# Patient Record
Sex: Female | Born: 1975 | Race: Black or African American | Hispanic: No | Marital: Single | Smoking: Current some day smoker
Health system: Southern US, Community
[De-identification: ages and names within clinical notes are randomized; demographics above are authoritative.]

## PROBLEM LIST (undated history)

## (undated) ENCOUNTER — Inpatient Hospital Stay (HOSPITAL_COMMUNITY): Payer: Self-pay

## (undated) DIAGNOSIS — O09523 Supervision of elderly multigravida, third trimester: Secondary | ICD-10-CM

## (undated) DIAGNOSIS — N736 Female pelvic peritoneal adhesions (postinfective): Secondary | ICD-10-CM

## (undated) DIAGNOSIS — O4590 Premature separation of placenta, unspecified, unspecified trimester: Secondary | ICD-10-CM

## (undated) DIAGNOSIS — D649 Anemia, unspecified: Secondary | ICD-10-CM

## (undated) DIAGNOSIS — O0943 Supervision of pregnancy with grand multiparity, third trimester: Secondary | ICD-10-CM

## (undated) DIAGNOSIS — O139 Gestational [pregnancy-induced] hypertension without significant proteinuria, unspecified trimester: Secondary | ICD-10-CM

## (undated) DIAGNOSIS — T7491XA Unspecified adult maltreatment, confirmed, initial encounter: Secondary | ICD-10-CM

## (undated) DIAGNOSIS — Z720 Tobacco use: Secondary | ICD-10-CM

## (undated) DIAGNOSIS — F1510 Other stimulant abuse, uncomplicated: Secondary | ICD-10-CM

## (undated) DIAGNOSIS — O09299 Supervision of pregnancy with other poor reproductive or obstetric history, unspecified trimester: Secondary | ICD-10-CM

## (undated) DIAGNOSIS — J45909 Unspecified asthma, uncomplicated: Secondary | ICD-10-CM

## (undated) DIAGNOSIS — F32A Depression, unspecified: Secondary | ICD-10-CM

## (undated) DIAGNOSIS — F329 Major depressive disorder, single episode, unspecified: Secondary | ICD-10-CM

---

## 1996-05-01 DIAGNOSIS — 419620001 Death: Secondary | SNOMED CT

## 1996-05-01 DEATH — deceased

## 2016-05-29 ENCOUNTER — Inpatient Hospital Stay (HOSPITAL_COMMUNITY): Payer: Medicaid Other

## 2016-05-29 ENCOUNTER — Encounter (HOSPITAL_COMMUNITY): Payer: Self-pay | Admitting: *Deleted

## 2016-05-29 ENCOUNTER — Inpatient Hospital Stay (HOSPITAL_COMMUNITY)
Admission: AD | Admit: 2016-05-29 | Discharge: 2016-05-29 | Disposition: A | Discharge status: Home / Self Care | Payer: Medicaid Other | Source: Ambulatory Visit | Attending: Obstetrics & Gynecology | Admitting: Obstetrics & Gynecology

## 2016-05-29 DIAGNOSIS — O98811 Other maternal infectious and parasitic diseases complicating pregnancy, first trimester: Secondary | ICD-10-CM | POA: Diagnosis not present

## 2016-05-29 DIAGNOSIS — F17210 Nicotine dependence, cigarettes, uncomplicated: Secondary | ICD-10-CM | POA: Diagnosis not present

## 2016-05-29 DIAGNOSIS — Z3A09 9 weeks gestation of pregnancy: Secondary | ICD-10-CM | POA: Insufficient documentation

## 2016-05-29 DIAGNOSIS — O99331 Smoking (tobacco) complicating pregnancy, first trimester: Secondary | ICD-10-CM | POA: Insufficient documentation

## 2016-05-29 DIAGNOSIS — B3731 Acute candidiasis of vulva and vagina: Secondary | ICD-10-CM

## 2016-05-29 DIAGNOSIS — R1030 Lower abdominal pain, unspecified: Secondary | ICD-10-CM | POA: Diagnosis present

## 2016-05-29 DIAGNOSIS — B9689 Other specified bacterial agents as the cause of diseases classified elsewhere: Secondary | ICD-10-CM | POA: Diagnosis not present

## 2016-05-29 DIAGNOSIS — N760 Acute vaginitis: Secondary | ICD-10-CM

## 2016-05-29 DIAGNOSIS — R109 Unspecified abdominal pain: Secondary | ICD-10-CM

## 2016-05-29 DIAGNOSIS — B373 Candidiasis of vulva and vagina: Secondary | ICD-10-CM

## 2016-05-29 DIAGNOSIS — Z3491 Encounter for supervision of normal pregnancy, unspecified, first trimester: Secondary | ICD-10-CM

## 2016-05-29 DIAGNOSIS — O26899 Other specified pregnancy related conditions, unspecified trimester: Secondary | ICD-10-CM

## 2016-05-29 HISTORY — DX: Gestational (pregnancy-induced) hypertension without significant proteinuria, unspecified trimester: O13.9

## 2016-05-29 HISTORY — DX: Depression, unspecified: F32.A

## 2016-05-29 HISTORY — DX: Major depressive disorder, single episode, unspecified: F32.9

## 2016-05-29 HISTORY — DX: Unspecified asthma, uncomplicated: J45.909

## 2016-05-29 LAB — POCT PREGNANCY, URINE: Preg Test, Ur: POSITIVE — AB

## 2016-05-29 LAB — WET PREP, GENITAL
SPERM: NONE SEEN
TRICH WET PREP: NONE SEEN

## 2016-05-29 LAB — RAPID URINE DRUG SCREEN, HOSP PERFORMED
AMPHETAMINES: POSITIVE — AB
Barbiturates: NOT DETECTED
Benzodiazepines: NOT DETECTED
Cocaine: NOT DETECTED
Opiates: NOT DETECTED
TETRAHYDROCANNABINOL: NOT DETECTED

## 2016-05-29 LAB — URINALYSIS, ROUTINE W REFLEX MICROSCOPIC
Bilirubin Urine: NEGATIVE
GLUCOSE, UA: NEGATIVE mg/dL
Ketones, ur: NEGATIVE mg/dL
LEUKOCYTES UA: NEGATIVE
Nitrite: NEGATIVE
PH: 6 (ref 5.0–8.0)
Protein, ur: NEGATIVE mg/dL
Specific Gravity, Urine: 1.02 (ref 1.005–1.030)

## 2016-05-29 LAB — URINE MICROSCOPIC-ADD ON

## 2016-05-29 LAB — CBC
HCT: 36.8 % (ref 36.0–46.0)
HEMOGLOBIN: 12.7 g/dL (ref 12.0–15.0)
MCH: 29.7 pg (ref 26.0–34.0)
MCHC: 34.5 g/dL (ref 30.0–36.0)
MCV: 86 fL (ref 78.0–100.0)
Platelets: 237 10*3/uL (ref 150–400)
RBC: 4.28 MIL/uL (ref 3.87–5.11)
RDW: 15.3 % (ref 11.5–15.5)
WBC: 7.6 10*3/uL (ref 4.0–10.5)

## 2016-05-29 LAB — ABO/RH: ABO/RH(D): O POS

## 2016-05-29 LAB — HCG, QUANTITATIVE, PREGNANCY: hCG, Beta Chain, Quant, S: 133276 m[IU]/mL — ABNORMAL HIGH (ref <5)

## 2016-05-29 MED ORDER — METRONIDAZOLE 500 MG PO TABS: 500.0000 mg | ORAL_TABLET | Freq: Two times a day (BID) | ORAL | 0 refills | Status: DC

## 2016-05-29 MED ORDER — TERCONAZOLE 0.8 % VA CREA: 1.0000 | TOPICAL_CREAM | Freq: Every day | VAGINAL | 0 refills | Status: DC

## 2016-05-29 NOTE — MAU Provider Note (Signed)
- History     CSN: [id number]  Arrival date and time: 05/29/16 1206   First Provider Initiated Contact with Patient 05/29/16 1310      Chief Complaint  Patient presents with  . Abdominal Pain   HPI Tamara Bradford is a 40 y.o. [id number] at 6615w4d by LMP who presents with abdominal pain. Patient reports intermittent lower abdominal pain that started 3 days ago. Describes as sharp & achy pains. Rates pain 4/10. Has not treated. Endorses vaginal discharge that is thick & white; associated with vaginal irritation. Constipation for that last week. Last BM was 4 days ago. States she normally goes every 2-3 days.  Denies vaginal bleeding, n/v/d, fever/chills, or dysuria.    OB History    Gravida Para Term Preterm AB Living   17 16 15 1   14    SAB TAB Ectopic Multiple Live Births           2616      Past Medical History:  Diagnosis Date  . Asthma   . Depression   . Pregnancy induced hypertension   . Preterm labor     Past Surgical History:  Procedure Laterality Date  . CESAREAN SECTION      History reviewed. No pertinent family history.  Social History  Substance Use Topics  . Smoking status: Current Some Day Smoker    Types: Cigarettes  . Smokeless tobacco: Never Used  . Alcohol use No    Allergies:  Allergies  Allergen Reactions  . Cinnamon Hives  . Keflex [Cephalexin] Hives    No prescriptions prior to admission.    Review of Systems  Constitutional: Negative.   Gastrointestinal: Positive for abdominal pain and constipation. Negative for blood in stool, diarrhea, nausea and vomiting.  Genitourinary: Negative for dysuria.       + vaginal discharge & irritation No vaginal bleeding   Physical Exam   Blood pressure 97/73, pulse 101, temperature 98.7 F (37.1 C), temperature source Oral, resp. rate 18, height 5\' 7"  (1.702 m), weight 142 lb 3.2 oz (64.5 kg), last menstrual period 03/23/2016.  Physical Exam  Nursing note and vitals reviewed. Constitutional: She  is oriented to person, place, and time. She appears well-developed and well-nourished. No distress.  HENT:  Head: Normocephalic and atraumatic.  Eyes: Conjunctivae are normal. Right eye exhibits no discharge. Left eye exhibits no discharge. No scleral icterus.  Neck: Normal range of motion.  Cardiovascular: Normal rate, regular rhythm and normal heart sounds.   No murmur heard. Respiratory: Effort normal and breath sounds normal. No respiratory distress. She has no wheezes.  GI: Soft. Bowel sounds are normal. She exhibits no distension. There is tenderness in the right lower quadrant, suprapubic area and left lower quadrant. There is no rebound and no guarding.  Genitourinary: There is erythema in the vagina. No bleeding in the vagina. Vaginal discharge found.  Genitourinary Comments: Cervix closed  Neurological: She is alert and oriented to person, place, and time.  Skin: Skin is warm and dry. She is not diaphoretic.  Psychiatric: She has a normal mood and affect. Her behavior is normal. Judgment and thought content normal.    MAU Course  Procedures Results for orders placed or performed during the hospital encounter of 05/29/16 (from the past 24 hour(s))  Urinalysis, Routine w reflex microscopic (not [facility])     Status: Abnormal   Collection Time: 05/29/16 12:10 PM  Result Value Ref Range   Color, Urine YELLOW YELLOW   APPearance CLEAR CLEAR  Specific Gravity, Urine 1.020 1.005 - 1.030   pH 6.0 5.0 - 8.0   Glucose, UA NEGATIVE NEGATIVE mg/dL   Hgb urine dipstick SMALL (A) NEGATIVE   Bilirubin Urine NEGATIVE NEGATIVE   Ketones, ur NEGATIVE NEGATIVE mg/dL   Protein, ur NEGATIVE NEGATIVE mg/dL   Nitrite NEGATIVE NEGATIVE   Leukocytes, UA NEGATIVE NEGATIVE  Urine microscopic-add on     Status: Abnormal   Collection Time: 05/29/16 12:10 PM  Result Value Ref Range   Squamous Epithelial / LPF 0-5 (A) NONE SEEN   WBC, UA 0-5 0 - 5 WBC/hpf   RBC / HPF 0-5 0 - 5 RBC/hpf   Bacteria,  UA FEW (A) NONE SEEN  Urine rapid drug screen (hosp performed)     Status: Abnormal   Collection Time: 05/29/16 12:10 PM  Result Value Ref Range   Opiates NONE DETECTED NONE DETECTED   Cocaine NONE DETECTED NONE DETECTED   Benzodiazepines NONE DETECTED NONE DETECTED   Amphetamines POSITIVE (A) NONE DETECTED   Tetrahydrocannabinol NONE DETECTED NONE DETECTED   Barbiturates NONE DETECTED NONE DETECTED  Pregnancy, urine POC     Status: Abnormal   Collection Time: 05/29/16 12:32 PM  Result Value Ref Range   Preg Test, Ur POSITIVE (A) NEGATIVE  CBC     Status: None   Collection Time: 05/29/16  1:18 PM  Result Value Ref Range   WBC 7.6 4.0 - 10.5 K/uL   RBC 4.28 3.87 - 5.11 MIL/uL   Hemoglobin 12.7 12.0 - 15.0 g/dL   HCT 16.1 09.6 - 04.5 %   MCV 86.0 78.0 - 100.0 fL   MCH 29.7 26.0 - 34.0 pg   MCHC 34.5 30.0 - 36.0 g/dL   RDW 40.9 81.1 - 91.4 %   Platelets 237 150 - 400 K/uL  ABO/Rh     Status: None (Preliminary result)   Collection Time: 05/29/16  1:18 PM  Result Value Ref Range   ABO/RH(D) O POS   hCG, quantitative, pregnancy     Status: Abnormal   Collection Time: 05/29/16  1:18 PM  Result Value Ref Range   hCG, Beta Chain, Quant, S 133,276 (H) <5 mIU/mL  Wet prep, genital     Status: Abnormal   Collection Time: 05/29/16  1:22 PM  Result Value Ref Range   Yeast Wet Prep HPF POC PRESENT (A) NONE SEEN   Trich, Wet Prep NONE SEEN NONE SEEN   Clue Cells Wet Prep HPF POC PRESENT (A) NONE SEEN   WBC, Wet Prep HPF POC FEW (A) NONE SEEN   Sperm NONE SEEN    US Ob Comp Less 14 Wks  Result Date: 05/29/2016 CLINICAL DATA:  First trimester of pregnancy, abdominal pain. EXAM: OBSTETRIC <14 WK ULTRASOUND TECHNIQUE: Transabdominal ultrasound was performed for evaluation of the gestation as well as the maternal uterus and adnexal regions. COMPARISON:  None. FINDINGS: Intrauterine gestational sac: Single. Yolk sac:  Visualized. Embryo:  Visualized. Cardiac Activity: Visualized. Heart Rate:  179 bpm CRL:   24.9  mm   9 w 1 d                  [place] EDC: Dec 28, 2016. Subchorionic hemorrhage:  None visualized. Maternal uterus/adnexae: Both ovaries appear normal. No free fluid is noted. IMPRESSION: Single live intrauterine gestation of 9 weeks 1 day. Electronically Signed   By: Lupita Raider, M.D.   On: 05/29/2016 14:25    MDM +UPT UA, wet prep, GC/chlamydia, CBC, ABO/Rh, quant  hCG, HIV, and US today to rule out ectopic pregnancy Ultrasound shows SIUP with cardiac activity UDS + amphetamines; admits to use last Wednesday; info given for [facility]  Assessment and Plan  A: 1. BV (bacterial vaginosis)   2. Abdominal pain during pregnancy   3. Vaginal yeast infection   4. Normal IUP (intrauterine pregnancy) on prenatal ultrasound, first trimester    P: Discharge home Rx terazol & flagyl Start prenatal care -- provider list & verification letter given [facility]t substance abuse Discussed reasons to return to MAU  Judeth HornErin Alfio Loescher 05/29/2016, 1:10 PM

## 2016-05-29 NOTE — Discharge Instructions (Signed)
Constipation, Adult Constipation is when a person has fewer than three bowel movements a week, has difficulty having a bowel movement, or has stools that are dry, hard, or larger than normal. As people grow older, constipation is more common. A low-fiber diet, not taking in enough fluids, and taking certain medicines may make constipation worse.  CAUSES   Certain medicines, such as antidepressants, pain medicine, iron supplements, antacids, and water pills.   Certain diseases, such as diabetes, irritable bowel syndrome (IBS), thyroid disease, or depression.   Not drinking enough water.   Not eating enough fiber-rich foods.   Stress or travel.   Lack of physical activity or exercise.   Ignoring the urge to have a bowel movement.   Using laxatives too much.  SIGNS AND SYMPTOMS   Having fewer than three bowel movements a week.   Straining to have a bowel movement.   Having stools that are hard, dry, or larger than normal.   Feeling full or bloated.   Pain in the lower abdomen.   Not feeling relief after having a bowel movement.  DIAGNOSIS  Your health care provider will take a medical history and perform a physical exam. Further testing may be done for severe constipation. Some tests may include:  A barium enema X-ray to examine your rectum, colon, and, sometimes, your small intestine.   A sigmoidoscopy to examine your lower colon.   A colonoscopy to examine your entire colon. TREATMENT  Treatment will depend on the severity of your constipation and what is causing it. Some dietary treatments include drinking more fluids and eating more fiber-rich foods. Lifestyle treatments may include regular exercise. If these diet and lifestyle recommendations do not help, your health care provider may recommend taking over-the-counter laxative medicines to help you have bowel movements. Prescription medicines may be prescribed if over-the-counter medicines do not work.    HOME CARE INSTRUCTIONS   Eat foods that have a lot of fiber, such as fruits, vegetables, whole grains, and beans.  Limit foods high in fat and processed sugars, such as french fries, hamburgers, cookies, candies, and soda.   A fiber supplement may be added to your diet if you cannot get enough fiber from foods.   Drink enough fluids to keep your urine clear or pale yellow.   Exercise regularly or as directed by your health care provider.   Go to the restroom when you have the urge to go. Do not hold it.   Only take over-the-counter or prescription medicines as directed by your health care provider. Do not take other medicines for constipation without talking to your health care provider first.  SEEK IMMEDIATE MEDICAL CARE IF:   You have bright red blood in your stool.   Your constipation lasts for more than 4 days or gets worse.   You have abdominal or rectal pain.   You have thin, pencil-like stools.   You have unexplained weight loss. MAKE SURE YOU:   Understand these instructions.  Will watch your condition.  Will get help right away if you are not doing well or get worse.   This information is not intended to replace advice given to you by your health care provider. Make sure you discuss any questions you have with your health care provider.   Document Released: 04/15/2004 Document Revised: 08/08/2014 Document Reviewed: 04/29/2013 Elsevier Interactive Patient Education 2016 Elsevier Inc. Abdominal Pain During Pregnancy Belly (abdominal) pain is common during pregnancy. Most of the time, it is not a serious  problem. Other times, it can be a sign that something is wrong with the pregnancy. Always tell your doctor if you have belly pain. HOME CARE Monitor your belly pain for any changes. The following actions may help you feel better:  Do not have sex (intercourse) or put anything in your vagina until you feel better.  Rest until your pain stops.  Drink  clear fluids if you feel sick to your stomach (nauseous). Do not eat solid food until you feel better.  Only take medicine as told by your doctor.  Keep all doctor visits as told. GET HELP RIGHT AWAY IF:   You are bleeding, leaking fluid, or pieces of tissue come out of your vagina.  You have more pain or cramping.  You keep throwing up (vomiting).  You have pain when you pee (urinate) or have blood in your pee.  You have a fever.  You do not feel your baby moving as much.  You feel very weak or feel like passing out.  You have trouble breathing, with or without belly pain.  You have a very bad headache and belly pain.  You have fluid leaking from your vagina and belly pain.  You keep having watery poop (diarrhea).  Your belly pain does not go away after resting, or the pain gets worse. MAKE SURE YOU:   Understand these instructions.  Will watch your condition.  Will get help right away if you are not doing well or get worse.   This information is not intended to replace advice given to you by your health care provider. Make sure you discuss any questions you have with your health care provider.   Document Released: 07/06/2009 Document Revised: 03/20/2013 Document Reviewed: 02/14/2013 Elsevier Interactive Patient Education [employer] Elsevier Inc[employer]

## 2016-05-29 NOTE — MAU Note (Signed)
Abdominal pain x 3 days.  Off and on pain with urination.  LMP Mar 23, 2016.   Denies vaginal bleeding, increased vaginal discharge x2 days .   Last intercourse 2 months ago

## 2016-05-30 LAB — GC/CHLAMYDIA PROBE AMP (~~LOC~~) NOT AT ARMC
CHLAMYDIA, DNA PROBE: NEGATIVE
NEISSERIA GONORRHEA: NEGATIVE

## 2016-05-30 LAB — HIV ANTIBODY (ROUTINE TESTING W REFLEX): HIV SCREEN 4TH GENERATION: NONREACTIVE

## 2016-06-08 ENCOUNTER — Inpatient Hospital Stay (HOSPITAL_COMMUNITY)
Admission: AD | Admit: 2016-06-08 | Discharge: 2016-06-08 | Discharge status: Against Medical Advice | Payer: Medicaid Other | Source: Ambulatory Visit | Attending: Obstetrics and Gynecology | Admitting: Obstetrics and Gynecology

## 2016-06-08 ENCOUNTER — Encounter (HOSPITAL_COMMUNITY): Payer: Self-pay

## 2016-06-08 DIAGNOSIS — O209 Hemorrhage in early pregnancy, unspecified: Secondary | ICD-10-CM

## 2016-06-08 DIAGNOSIS — Z3A11 11 weeks gestation of pregnancy: Secondary | ICD-10-CM | POA: Diagnosis not present

## 2016-06-08 DIAGNOSIS — Z3491 Encounter for supervision of normal pregnancy, unspecified, first trimester: Secondary | ICD-10-CM | POA: Insufficient documentation

## 2016-06-08 NOTE — MAU Note (Signed)
Patient went to bathroom and I informed her that her fiance was not in the waiting room. Patient stated that she could not stay without him and needed to go look for him. Walked with patient to the waiting room. Patient walked outside came back in then walked down the hall. Patient did not state if she would come back.

## 2016-06-08 NOTE — MAU Provider Note (Signed)
Patient left AMA prior to MSE or evaluation.  Tamara NewcomerUgonna A Malori Myers, MD

## 2016-06-08 NOTE — MAU Note (Addendum)
Patient sat in lobby. I went to speak to patient to ask would she like to come back to be evaluated. Patient said she was going to go home and didn't want to be seen. Patient left AMA.
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
## 2016-06-17 ENCOUNTER — Inpatient Hospital Stay (HOSPITAL_COMMUNITY)
Admission: AD | Admit: 2016-06-17 | Discharge: 2016-06-17 | Disposition: A | Discharge status: Home / Self Care | Payer: Medicaid Other | Source: Ambulatory Visit | Attending: Obstetrics and Gynecology | Admitting: Obstetrics and Gynecology

## 2016-06-17 ENCOUNTER — Other Ambulatory Visit: Payer: Self-pay | Admitting: Obstetrics and Gynecology

## 2016-06-17 DIAGNOSIS — Z3491 Encounter for supervision of normal pregnancy, unspecified, first trimester: Secondary | ICD-10-CM | POA: Diagnosis present

## 2016-06-17 DIAGNOSIS — J4541 Moderate persistent asthma with (acute) exacerbation: Secondary | ICD-10-CM

## 2016-06-17 DIAGNOSIS — Z3A12 12 weeks gestation of pregnancy: Secondary | ICD-10-CM | POA: Insufficient documentation

## 2016-06-17 LAB — OB RESULTS CONSOLE HEPATITIS B SURFACE ANTIGEN: Hepatitis B Surface Ag: NEGATIVE

## 2016-06-17 LAB — OB RESULTS CONSOLE RUBELLA ANTIBODY, IGM: Rubella: IMMUNE

## 2016-06-17 LAB — OB RESULTS CONSOLE ANTIBODY SCREEN: ANTIBODY SCREEN: NEGATIVE

## 2016-06-17 LAB — OB RESULTS CONSOLE ABO/RH: RH Type: POSITIVE

## 2016-06-17 LAB — OB RESULTS CONSOLE GC/CHLAMYDIA: GC PROBE AMP, GENITAL: NEGATIVE

## 2016-06-17 LAB — CYTOLOGY - PAP: PAP SMEAR: ABNORMAL — AB

## 2016-06-17 LAB — OB RESULTS CONSOLE RPR: RPR: NONREACTIVE

## 2016-06-17 MED ORDER — SODIUM CHLORIDE 0.9 % IV SOLN
INTRAVENOUS | Status: DC
  Administered 2016-06-17: 13:00:00 via INTRAVENOUS

## 2016-06-17 MED ORDER — BUDESONIDE 90 MCG/ACT IN AEPB: 1.0000 | INHALATION_SPRAY | Freq: Two times a day (BID) | RESPIRATORY_TRACT | 6 refills | Status: DC

## 2016-06-17 MED ORDER — ALBUTEROL SULFATE (2.5 MG/3ML) 0.083% IN NEBU
2.5000 mg | INHALATION_SOLUTION | Freq: Once | RESPIRATORY_TRACT | Status: AC
  Administered 2016-06-17: 2.5 mg via RESPIRATORY_TRACT
  Filled 2016-06-17: qty 3

## 2016-06-17 MED ORDER — METHYLPREDNISOLONE SODIUM SUCC 125 MG IJ SOLR
125.0000 mg | Freq: Once | INTRAMUSCULAR | Status: AC
  Administered 2016-06-17: 125 mg via INTRAVENOUS
  Filled 2016-06-17: qty 2

## 2016-06-17 MED ORDER — PREDNISONE 50 MG PO TABS: ORAL_TABLET | ORAL | 0 refills | Status: DC

## 2016-06-17 NOTE — MAU Note (Signed)
Been having SOB, hx of asthma. Using inhalers, "but they are fairly old. Has cough, sometimes productive

## 2016-06-17 NOTE — Discharge Instructions (Signed)

## 2016-06-20 LAB — CYTOLOGY - PAP

## 2016-06-21 DIAGNOSIS — R87610 Atypical squamous cells of undetermined significance on cytologic smear of cervix (ASC-US): Secondary | ICD-10-CM | POA: Insufficient documentation

## 2016-06-23 LAB — GLUCOSE TOLERANCE, 1 HOUR: GLUCOSE 1 HOUR GTT: 79

## 2016-07-03 ENCOUNTER — Encounter (HOSPITAL_COMMUNITY): Payer: Self-pay | Admitting: Emergency Medicine

## 2016-07-03 ENCOUNTER — Emergency Department (HOSPITAL_COMMUNITY)
Admission: EM | Admit: 2016-07-03 | Discharge: 2016-07-03 | Disposition: A | Discharge status: Home / Self Care | Payer: Medicaid Other | Attending: Emergency Medicine | Admitting: Emergency Medicine

## 2016-07-03 ENCOUNTER — Emergency Department (HOSPITAL_BASED_OUTPATIENT_CLINIC_OR_DEPARTMENT_OTHER)
Admission: RE | Admit: 2016-07-03 | Discharge: 2016-07-03 | Disposition: A | Discharge status: Home / Self Care | Payer: Medicaid Other | Source: Ambulatory Visit | Attending: Emergency Medicine | Admitting: Emergency Medicine

## 2016-07-03 DIAGNOSIS — O9989 Other specified diseases and conditions complicating pregnancy, childbirth and the puerperium: Secondary | ICD-10-CM | POA: Insufficient documentation

## 2016-07-03 DIAGNOSIS — Z79899 Other long term (current) drug therapy: Secondary | ICD-10-CM | POA: Diagnosis not present

## 2016-07-03 DIAGNOSIS — J4520 Mild intermittent asthma, uncomplicated: Secondary | ICD-10-CM | POA: Diagnosis not present

## 2016-07-03 DIAGNOSIS — M79609 Pain in unspecified limb: Secondary | ICD-10-CM

## 2016-07-03 DIAGNOSIS — Z3A14 14 weeks gestation of pregnancy: Secondary | ICD-10-CM | POA: Diagnosis not present

## 2016-07-03 DIAGNOSIS — O99331 Smoking (tobacco) complicating pregnancy, first trimester: Secondary | ICD-10-CM | POA: Diagnosis not present

## 2016-07-03 DIAGNOSIS — Z86718 Personal history of other venous thrombosis and embolism: Secondary | ICD-10-CM

## 2016-07-03 DIAGNOSIS — F17210 Nicotine dependence, cigarettes, uncomplicated: Secondary | ICD-10-CM | POA: Diagnosis not present

## 2016-07-03 DIAGNOSIS — M79605 Pain in left leg: Secondary | ICD-10-CM

## 2016-07-03 MED ORDER — HYDROCODONE-ACETAMINOPHEN 5-325 MG PO TABS
1.0000 | ORAL_TABLET | Freq: Once | ORAL | Status: AC
  Administered 2016-07-03: 1 via ORAL
  Filled 2016-07-03: qty 1

## 2016-07-03 MED ORDER — ALBUTEROL SULFATE HFA 108 (90 BASE) MCG/ACT IN AERS
2.0000 | INHALATION_SPRAY | Freq: Four times a day (QID) | RESPIRATORY_TRACT | Status: DC | PRN
  Administered 2016-07-03: 2 via RESPIRATORY_TRACT
  Filled 2016-07-03: qty 6.7

## 2016-07-03 NOTE — ED Provider Notes (Signed)
The patient turned over to me from the nighttime emergency physician. Patient was awaiting Doppler study of left leg. Doppler study negative for DVT. While waiting for that patient did have some shortness of breath and had some wheezing. Patient normally is on albuterol inhaler but apparently has run out or has very little at home. Patient given an albuterol inhaler here with resolution of those symptoms. Oxygen saturations are in the upper 90s. Patient's approximately [redacted] weeks pregnant. [facility]. Patient will continue to use her albuterol inhaler and follow-up with OB. Patient nontoxic no acute distress. Stable for discharge home.   [name], MD 07/03/16 0930

## 2016-07-03 NOTE — ED Notes (Signed)
Pt called out and said she was having SOB, I went in and applied 2L of O2. Nurse notified

## 2016-07-03 NOTE — ED Provider Notes (Signed)
[facility]-EMERGENCY DEPT Provider Note   CSN: [id number] Arrival date & time: 07/03/16  0419     History   Chief Complaint Chief Complaint  Patient presents with  . Leg Pain    HPI Tamara Bradford is a G17P16  40 y.o. female currently approximately [redacted] weeks pregnant who presents with left leg pain. Patient reports 2 day history of left lower leg pain. She describes it as sharp and nonradiating. She's had similar pain in the past and has a history of DVT associated with pregnancy. This pain is similar. She reports pain is 8 out of 10. She also reports headache that started yesterday. It comes and goes. Recent upper respiratory symptoms including runny nose. Denies fevers. Denies injury of the leg. Patient reports that she has seen her OB and had dates confirmed. Denies any pregnancy related symptoms including abdominal pain, loss of fluids or discharge.  HPI  Past Medical History:  Diagnosis Date  . Asthma   . Depression   . Pregnancy induced hypertension   . Preterm labor     There are no active problems to display for this patient.   Past Surgical History:  Procedure Laterality Date  . CESAREAN SECTION      OB History    Gravida Para Term Preterm AB Living   17 16 15 1   14    SAB TAB Ectopic Multiple Live Births           16       Home Medications    Prior to Admission medications   Medication Sig Start Date End Date Taking? Authorizing Provider  acetaminophen-codeine (TYLENOL #3) 300-30 MG tablet Take 1 tablet by mouth every 6 (six) hours as needed for moderate pain.   Yes Historical Provider, MD  albuterol (PROVENTIL HFA;VENTOLIN HFA) 108 (90 Base) MCG/ACT inhaler Inhale 2 puffs into the lungs every 6 (six) hours as needed for wheezing or shortness of breath.   Yes Historical Provider, MD  penicillin v potassium (VEETID) 500 MG tablet Take 500 mg by mouth 4 (four) times daily.   Yes Historical Provider, MD  predniSONE (DELTASONE) 50 MG tablet Take 1 tablet (50 mg) by  mouth daily for 5 days. 06/17/16  Yes Elizabeth Woodland Mumaw, DO  Budesonide 90 MCG/ACT inhaler Inhale 1 puff into the lungs 2 (two) times daily. Patient not taking: Reported on 07/03/2016 06/17/16   [facility] Mum[facility], DO  metroNIDAZOLE (FLAGYL) 500 MG tablet Take 1 tablet (500 mg total) by mouth 2 (two) times daily. Patient not taking: Reported on 07/03/2016 05/29/16   Judeth HornErin Lawrence, NP  terconazole (TERAZOL 3) 0.8 % vaginal cream Place 1 applicator vaginally at bedtime. Patient not taking: Reported on 07/03/2016 05/29/16   Judeth HornErin Lawrence, NP    Family History No family history on file.  Social History Social History  Substance Use Topics  . Smoking status: Current Some Day Smoker    Types: Cigarettes  . Smokeless tobacco: Never Used  . Alcohol use No     Allergies   Cinnamon and Keflex [cephalexin]   Review of Systems Review of Systems  Constitutional: Negative for fever.  HENT: Positive for rhinorrhea.   Respiratory: Negative for shortness of breath.   Cardiovascular: Negative for chest pain.  Gastrointestinal: Negative for abdominal pain.  Genitourinary: Negative for vaginal bleeding.  Musculoskeletal:       Left leg pain  Neurological: Positive for headaches.  All other systems reviewed and are negative.    Physical Exam Updated Vital  Signs BP (!) 101/49   Pulse 70   Temp 98.5 F (36.9 C) (Oral)   Resp 16   Ht 5\' 7"  (1.702 m)   Wt 159 lb (72.1 kg)   LMP 03/23/2016 (Approximate)   SpO2 100%   BMI 24.90 kg/m   Physical Exam  Constitutional: She is oriented to person, place, and time. She appears well-developed and well-nourished. No distress.  HENT:  Head: Normocephalic and atraumatic.  Cardiovascular: Normal rate, regular rhythm and normal heart sounds.   No murmur heard. Pulmonary/Chest: Effort normal. No respiratory distress. She has no wheezes.  Abdominal: Soft. Bowel sounds are normal. There is no tenderness. There is no guarding.    Musculoskeletal:  No significant left lower extremity swelling, no tenderness to palpation, no overlying skin changes  Neurological: She is alert and oriented to person, place, and time.  Skin: Skin is warm and dry.  Psychiatric: She has a normal mood and affect.  Nursing note and vitals reviewed.    ED Treatments / Results  Labs (all labs ordered are listed, but only abnormal results are displayed) Labs Reviewed - No data to display  EKG  EKG Interpretation None       Radiology No results found.  Procedures Procedures (including critical care time)  Medications Ordered in ED Medications  HYDROcodone-acetaminophen (NORCO/VICODIN) 5-325 MG per tablet 1 tablet (1 tablet Oral Given 07/03/16 0516)     Initial Impression / Assessment and Plan / ED Course  I have reviewed the triage vital signs and the nursing notes.  Pertinent labs & imaging results that were available during my care of the patient were reviewed by me and considered in my medical decision making (see chart for details).  Clinical Course     She presents with left lower calf pain. History of similar symptoms with DVT during pregnancy. Her exam is reassuring without tenderness, swelling, or overlying skin changes. However, given her history she needs ultrasound to rule out DVT. She was given pain medication. Will hold patient for ultrasound later this morning.  Final Clinical Impressions(s) / ED Diagnoses   Final diagnoses:  Left leg pain    New Prescriptions New Prescriptions   No medications on file     Shon Batonourtney F Delyle Weider, MD 07/03/16 [phone]

## 2016-07-03 NOTE — Progress Notes (Signed)
VASCULAR LAB PRELIMINARY  PRELIMINARY  PRELIMINARY  PRELIMINARY  Left lower extremity venous duplex completed.    Preliminary report:  There is no DVT or SVT noted in the left lower extremity.   Called report to EDP.  Katyana Trolinger, RVT 07/03/2016, 9:11 AM

## 2016-07-03 NOTE — ED Triage Notes (Signed)
Pt in reporting L lower leg pain, starts in calf and radiates down towards feet. Describes as sharp. Also endorses HA. States S/S have been present since yesterday. A/OX4, ambulatory. Pt is approx [redacted] wks pregnant.

## 2016-07-03 NOTE — ED Notes (Signed)
Pt taken to vascular

## 2016-07-03 NOTE — Discharge Instructions (Signed)
Usual albuterol inhaler 2 puffs every 6 hours as needed. Return for any new or worse symptoms. Ultrasound of the left leg without any evidence of a blood clot. Follow up with your OB/GYN. Return for any new or worse symptoms.

## 2016-07-03 NOTE — ED Notes (Addendum)
Pt complained to EMT that she was having shortness of breath. EMT placed pt on 2L of O2 via nasal cannula for comfort, O2 saturation at 98%. EKG captured as well. MD aware of expiratory wheezing and hx of asthma.

## 2016-07-05 ENCOUNTER — Encounter (HOSPITAL_COMMUNITY): Payer: Self-pay | Admitting: *Deleted

## 2016-07-05 ENCOUNTER — Inpatient Hospital Stay (HOSPITAL_COMMUNITY)
Admission: AD | Admit: 2016-07-05 | Discharge: 2016-07-05 | Disposition: A | Discharge status: Home / Self Care | Payer: Medicaid Other | Source: Ambulatory Visit | Attending: Obstetrics and Gynecology | Admitting: Obstetrics and Gynecology

## 2016-07-05 DIAGNOSIS — F17210 Nicotine dependence, cigarettes, uncomplicated: Secondary | ICD-10-CM | POA: Diagnosis not present

## 2016-07-05 DIAGNOSIS — W182XXA Fall in (into) shower or empty bathtub, initial encounter: Secondary | ICD-10-CM | POA: Diagnosis not present

## 2016-07-05 DIAGNOSIS — Z3A14 14 weeks gestation of pregnancy: Secondary | ICD-10-CM | POA: Insufficient documentation

## 2016-07-05 DIAGNOSIS — S79911A Unspecified injury of right hip, initial encounter: Secondary | ICD-10-CM | POA: Diagnosis not present

## 2016-07-05 DIAGNOSIS — Y93E1 Activity, personal bathing and showering: Secondary | ICD-10-CM | POA: Diagnosis not present

## 2016-07-05 DIAGNOSIS — O26892 Other specified pregnancy related conditions, second trimester: Secondary | ICD-10-CM | POA: Insufficient documentation

## 2016-07-05 DIAGNOSIS — O9A212 Injury, poisoning and certain other consequences of external causes complicating pregnancy, second trimester: Secondary | ICD-10-CM | POA: Diagnosis not present

## 2016-07-05 DIAGNOSIS — O99332 Smoking (tobacco) complicating pregnancy, second trimester: Secondary | ICD-10-CM | POA: Insufficient documentation

## 2016-07-05 DIAGNOSIS — T1490XA Injury, unspecified, initial encounter: Secondary | ICD-10-CM
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
## 2016-07-14 ENCOUNTER — Telehealth: Payer: Self-pay | Admitting: Pulmonary Disease

## 2016-07-14 ENCOUNTER — Encounter: Payer: Self-pay | Admitting: Pulmonary Disease

## 2016-07-14 ENCOUNTER — Ambulatory Visit (INDEPENDENT_AMBULATORY_CARE_PROVIDER_SITE_OTHER): Payer: Medicaid Other | Admitting: Pulmonary Disease

## 2016-07-14 ENCOUNTER — Encounter (INDEPENDENT_AMBULATORY_CARE_PROVIDER_SITE_OTHER): Payer: Self-pay

## 2016-07-14 DIAGNOSIS — F17200 Nicotine dependence, unspecified, uncomplicated: Secondary | ICD-10-CM | POA: Diagnosis not present

## 2016-07-14 DIAGNOSIS — J4550 Severe persistent asthma, uncomplicated: Secondary | ICD-10-CM | POA: Diagnosis not present

## 2016-07-14 DIAGNOSIS — K219 Gastro-esophageal reflux disease without esophagitis: Secondary | ICD-10-CM

## 2016-07-14 DIAGNOSIS — J4520 Mild intermittent asthma, uncomplicated: Secondary | ICD-10-CM | POA: Insufficient documentation

## 2016-07-14 MED ORDER — BUDESONIDE 180 MCG/ACT IN AEPB: 2.0000 | INHALATION_SPRAY | Freq: Two times a day (BID) | RESPIRATORY_TRACT | 3 refills | Status: DC

## 2016-07-14 NOTE — Addendum Note (Signed)
Addended by: Sheran LuzEAST, Madline Oesterling K on: 07/14/2016 11:29 AM   Modules accepted: Orders

## 2016-07-14 NOTE — Progress Notes (Signed)
Subjective:    Patient ID: Tamara Bradford, female    DOB: 06/10/1976, 40 y.o.   MRN: [id number]  HPI She reports she was diagnosed with asthma at 40 y.o. with an admission. She had a syncopal event in school. She has been on Ventolin, Albuterol, and inhaled steroids. No h/o bronchitis. She reports asthma exacerbations more frequently recently. She has been seen 2 times per year at most. Previously was only once a year. She seems to have more problems with her breathing during the cold months and in the winter. She reports recently she has been using her inhaler 3-4 times daily. She is having significantly more dyspnea recently. She is also waking up at night coughing. She does wheeze intermittently. She reports intermittent chest tightness. No recent chest pressure or pain. No fever, chills, or sweats. She reports intermittent reflux and dyspepsia. She does have morning brash water taste. She was prescribed Budesonide but her insurance would not cover it.   Review of Systems No rashes or bruising. She is having intermittent headaches. No focal weakness, numbness, or tingling. A pertinent 14 point review of systems is negative except as per the history of presenting illness.  Allergies  Allergen Reactions  . Cinnamon Hives  . Keflex [Cephalexin] Hives  . Naproxen Rash    Current Outpatient Prescriptions on File Prior to Visit  Medication Sig Dispense Refill  . acetaminophen-codeine (TYLENOL #3) 300-30 MG tablet Take 1 tablet by mouth every 6 (six) hours as needed for moderate pain.    Marland Kitchen. albuterol (PROVENTIL HFA;VENTOLIN HFA) 108 (90 Base) MCG/ACT inhaler Inhale 2 puffs into the lungs every 6 (six) hours as needed for wheezing or shortness of breath.    . Budesonide 90 MCG/ACT inhaler Inhale 1 puff into the lungs 2 (two) times daily. (Patient not taking: Reported on 07/14/2016) 1 each 6   No current facility-administered medications on file prior to visit.     Past Medical History:  Diagnosis  Date  . Asthma   . Depression   . Pregnancy induced hypertension   . Preterm labor     Past Surgical History:  Procedure Laterality Date  . CESAREAN SECTION      Family History  Problem Relation Age of Onset  . Prostate cancer Father   . Colon cancer Father   . GER disease Brother   . Lung disease Neg Hx     Social History   Social History  . Marital status: Single    Spouse name: N/A  . Number of children: N/A  . Years of education: N/A   Social History Main Topics  . Smoking status: Current Some Day Smoker    Packs/day: 0.25    Years: 22.00    Types: Cigarettes    Start date: 08/05/1993  . Smokeless tobacco: Current User     Comment: Peak rate of 1ppd - Quit for 1 year previously  . Alcohol use No     Comment: Occasionally - but none now  . Drug use:     Types: Methamphetamines  . Sexual activity: Yes   Other Topics Concern  . None   Social History Narrative   [facility]   [place]. 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 No pets currently. Previous home had mold (white mold). No bird or hot tub exposure. Currently staying motel with mold under bed.  Objective:   Physical Exam BP 118/70 (BP Location: Left Arm, Patient Position: Sitting, Cuff Size: Normal)   Pulse 85   Ht 5\' 7"  (1.702 m)   Wt 160 lb 9.6 oz (72.8 kg)   LMP 03/23/2016 (Approximate)   SpO2 97%   BMI 25.15 kg/m  General:  Awake. Alert. No acute distress. Husband with her today.  Integument:  Warm & dry. No rash on exposed skin. No bruising. Lymphatics:  No appreciated cervical or supraclavicular lymphadenoapthy. HEENT:  Moist mucus membranes. No oral ulcers. No scleral injection or icterus. Mild nasal turbinate swelling. Cardiovascular:  Regular rate. No edema. No appreciable JVD.  Pulmonary:  Good aeration bilaterally.No accessory muscle use on room air. Mild, coarse,  end-expiratory wheeze. Abdomen: Soft. Normal bowel sounds. Gravid. Musculoskeletal:  Normal bulk and tone. Hand grip strength 5/5 bilaterally. No joint deformity or effusion appreciated. Neurological:  CN 2-12 grossly in tact. No meningismus. Moving all 4 extremities equally. Symmetric brachioradialis deep tendon reflexes. Psychiatric:  Mood and affect congruent. Speech normal rhythm, rate & tone.   IMAGING VENOUS DUPLEX BILAT LE 07/03/16 (per radiologist): No evidence of DVT or SVT involving left lower extremity and right common femoral vein.  LABS 05/29/16 CBC:  7.6/12.7/36.8/237    Assessment & Plan:  40 y.o. female who is currently 5 months pregnant. Known underlying asthma since childhood with ongoing tobacco use which is certainly not helping her asthma control. Additionally, she has uncontrolled reflux. I did spend a significant amount of time today educating the patient on the need for control of her underlying reflux to prevent worsening of her asthma. I instructed the patient to contact my office if she had any new breathing problems or questions before next appointment. She is being referred to maternal fetal medicine due to her high risk pregnancy.  1. Severe, Persistent Asthma:  Prescribing Budesonide 180mcg 2 puffs twice daily. Continuing albuterol inhaler as needed. Checking full PFTs post delivery. 2. GERD: Patient counseled on appropriate dietary and lifestyle modifications. If these are ineffective for complete control of reflux plan to initiate Zantac 75 mg by mouth daily at bedtime. 3. Tobacco Use Disorder: Patient counseled on the need for complete tobacco cessation for over 3 minutes. 4. Follow-up: Patient to return to clinic in 2 months or sooner if needed.  Donna ChristenJennings E. Jamison NeighborNestor, M.D. [facility] Pager:  [phone] After 3pm or if no response, call [phone] 11:12 AM 07/14/16

## 2016-07-14 NOTE — Telephone Encounter (Signed)
IMAGING VENOUS DUPLEX BILAT LE 07/03/16 (per radiologist): No evidence of DVT or SVT involving left lower extremity and right common femoral vein.  LABS 05/29/16 CBC:  7.6/12.7/36.8/237

## 2016-07-14 NOTE — Patient Instructions (Signed)
   Please try to completely quit smoking.  Try the dietary and lifestyle changes we discussed today. If you are still having reflux and heartburn let me know.  Remember to use your Pulmicort (Budesonide) inhaler 2 puffs twice daily. Remember to brush your teeth, brush your tongue, rinse, gargle and spit afterward to keep from getting thrush.  I will see you back in 2 months or sooner if needed.

## 2016-07-20 ENCOUNTER — Encounter: Payer: Self-pay | Admitting: Pulmonary Disease

## 2016-07-21 ENCOUNTER — Encounter: Payer: Self-pay | Admitting: Pulmonary Disease

## 2016-08-09 ENCOUNTER — Inpatient Hospital Stay (HOSPITAL_COMMUNITY)
Admission: AD | Admit: 2016-08-09 | Discharge: 2016-08-09 | Disposition: A | Discharge status: Home / Self Care | Payer: Medicaid Other | Source: Ambulatory Visit | Attending: Obstetrics & Gynecology | Admitting: Obstetrics & Gynecology

## 2016-08-09 ENCOUNTER — Encounter (HOSPITAL_COMMUNITY): Payer: Self-pay

## 2016-08-09 DIAGNOSIS — Z8759 Personal history of other complications of pregnancy, childbirth and the puerperium: Secondary | ICD-10-CM

## 2016-08-09 DIAGNOSIS — O26892 Other specified pregnancy related conditions, second trimester: Secondary | ICD-10-CM | POA: Diagnosis not present

## 2016-08-09 DIAGNOSIS — O219 Vomiting of pregnancy, unspecified: Secondary | ICD-10-CM | POA: Insufficient documentation

## 2016-08-09 DIAGNOSIS — Z79899 Other long term (current) drug therapy: Secondary | ICD-10-CM | POA: Diagnosis not present

## 2016-08-09 DIAGNOSIS — Z3A19 19 weeks gestation of pregnancy: Secondary | ICD-10-CM | POA: Insufficient documentation

## 2016-08-09 DIAGNOSIS — F17200 Nicotine dependence, unspecified, uncomplicated: Secondary | ICD-10-CM | POA: Diagnosis present

## 2016-08-09 DIAGNOSIS — R102 Pelvic and perineal pain: Secondary | ICD-10-CM

## 2016-08-09 DIAGNOSIS — O09529 Supervision of elderly multigravida, unspecified trimester: Secondary | ICD-10-CM | POA: Diagnosis present

## 2016-08-09 DIAGNOSIS — Z888 Allergy status to other drugs, medicaments and biological substances status: Secondary | ICD-10-CM | POA: Insufficient documentation

## 2016-08-09 DIAGNOSIS — Z641 Problems related to multiparity: Secondary | ICD-10-CM

## 2016-08-09 DIAGNOSIS — O26899 Other specified pregnancy related conditions, unspecified trimester: Secondary | ICD-10-CM

## 2016-08-09 DIAGNOSIS — O99342 Other mental disorders complicating pregnancy, second trimester: Secondary | ICD-10-CM | POA: Diagnosis not present

## 2016-08-09 DIAGNOSIS — O09522 Supervision of elderly multigravida, second trimester: Secondary | ICD-10-CM

## 2016-08-09 DIAGNOSIS — O99512 Diseases of the respiratory system complicating pregnancy, second trimester: Secondary | ICD-10-CM | POA: Insufficient documentation

## 2016-08-09 DIAGNOSIS — O34219 Maternal care for unspecified type scar from previous cesarean delivery: Secondary | ICD-10-CM

## 2016-08-09 LAB — URINALYSIS, ROUTINE W REFLEX MICROSCOPIC
Bacteria, UA: NONE SEEN
Bilirubin Urine: NEGATIVE
GLUCOSE, UA: NEGATIVE mg/dL
KETONES UR: NEGATIVE mg/dL
LEUKOCYTES UA: NEGATIVE
NITRITE: NEGATIVE
PH: 7 (ref 5.0–8.0)
Protein, ur: NEGATIVE mg/dL
Specific Gravity, Urine: 1.008 (ref 1.005–1.030)

## 2016-08-09 NOTE — MAU Note (Addendum)
Pt presents to MAU with complaints of lower abdominal cramping and headache since last night and she says that she has not been able to keep anything down for 24 hours. Denies any vaginal bleeding or abnormal discharge

## 2016-08-09 NOTE — MAU Note (Signed)
Urine in lab 

## 2016-08-09 NOTE — MAU Note (Signed)
Signature pad not working. Discharge paper signed and put in paper chart.

## 2016-08-09 NOTE — MAU Provider Note (Signed)
Chief Complaint  Patient presents with  . Headache  . Nausea    SUBJECTIVE: Tamara Bradford is a 41 y.o. [id number] at 4273w6d presenting with several day hx of sharp crampy pain in lower abdomen and groin. The pain is exacerbated by walking and changing positions. It is relieved by rest. No self treatment.  States she had N/V yesterday but relieved by Protonix and denies nausea at present. Has retained only fluids today but is hungry.  No dysuria, urgency or frequency. She denies contractions, vaginal bleeding or leakage of fluid.  Denies asthma symptoms and takes meds prn only. Not taking PNVs or BASA. Mostly concerned she has not felt fetal movement yet and wanted to check baby. [facility]   ROS: Negative except as noted above.  Problem list, past medical history, Ob/Gyn history, surgical history, family history and social history reviewed. OB History  Gravida Para Term Preterm AB Living  17 16 15 1   14   SAB TAB Ectopic Multiple Live Births          16    # Outcome Date GA Lbr Len/2nd Weight Sex Delivery Anes PTL Lv  17 Current           16 Term 04/13/15    F CS-LTranv   LIV  15 Term 04/23/13    F CS-LTranv   LIV  14 Term 01/17/12     CS-LTranv   LIV     Complications: Abruptio Placenta  13 Term 12/22/10    M Vag-Spont   LIV  12 Term 09/13/09    F Vag-Spont   LIV  11 Term 03/11/08    F Vag-Spont   LIV  10 Term 05/13/05    M Vag-Spont   DEC  9 Term 03/22/03    M Vag-Spont   LIV  8 Term 10/12/00    M Vag-Spont   LIV  7 Term 02/17/00    F Vag-Spont   LIV  6 Term 07/10/98    F Vag-Spont   LIV  5 Term 04/1996     Vag-Spont   ND     Complications: SIDS (sudden infant death syndrome)  4 Term 01/28/95    F Vag-Spont   LIV  3 Term 11/10/93    F Vag-Spont   LIV  2 Term 08/08/92    Judie PetitM Vag-Spont   LIV  1 Preterm 09/01/88    M Vag-Spont  Y LIV      Prenatal course: Significant for ASCUS on Pap and MAU visit for asthma exacerbation [facility] and [facility] this [facility].  OBJECTIVE:  Vitals:   08/09/16 1238  BP: (!) 107/48  Pulse: 78  Resp: 18  Temp: 98 F (36.7 C)    Gen: NAD Abd: soft, mildly tender in lower abd and groin. Fundus at umbilicus. DT FHR 145 Back: negative CVAT VE: deferred            Results for orders placed or performed during the hospital encounter of 08/09/16 (from the past 24 hour(s))  Urinalysis, Routine w reflex microscopic     Status: Abnormal   Collection Time: 08/09/16 12:30 PM  Result Value Ref Range   Color, Urine STRAW (A) YELLOW   APPearance CLEAR CLEAR   Specific Gravity, Urine 1.008 1.005 - 1.030   pH 7.0 5.0 - 8.0   Glucose, UA NEGATIVE NEGATIVE mg/dL   Hgb urine dipstick SMALL (A) NEGATIVE  Bilirubin Urine NEGATIVE NEGATIVE   Ketones, ur NEGATIVE NEGATIVE mg/dL   Protein, ur NEGATIVE NEGATIVE mg/dL   Nitrite NEGATIVE NEGATIVE   Leukocytes, UA NEGATIVE NEGATIVE   RBC / HPF 0-5 0 - 5 RBC/hpf   WBC, UA 0-5 0 - 5 WBC/hpf   Bacteria, UA NONE SEEN NONE SEEN   Squamous Epithelial / LPF 0-5 (A) NONE SEEN   Mucous PRESENT    Past Medical History:  Diagnosis Date  . Asthma   . Depression   . Pregnancy induced hypertension   . Preterm labor    Smokes 2 cigarettes/day. Denies illicit drugs. Living in hotel but moving tomorrow. Children are with her family.     ASSESSMENT:/PLAN:  [id number] at [redacted]w[redacted]d 1. Pain of round ligament affecting pregnancy, antepartum   2. Elderly multigravida in second trimester   3. Uterine scar from previous cesarean delivery affecting pregnancy   4. History of gestational hypertension   5. Grand multiparity   6. Current smoker    Advised to take BASA 1/day and PNV 1/day Smoking cessation/behavior modification discussed Allergies as of 08/09/2016      Reactions   Cinnamon Hives   Keflex [cephalexin] Hives   Naproxen Rash      Medication List    STOP taking these medications   budesonide 180 MCG/ACT inhaler Commonly known as:   PULMICORT   Budesonide 90 MCG/ACT inhaler     TAKE these medications   albuterol 108 (90 Base) MCG/ACT inhaler Commonly known as:  PROVENTIL HFA;VENTOLIN HFA Inhale 2 puffs into the lungs every 6 (six) hours as needed for wheezing or shortness of breath.      Follow-up Information    [facility] Follow up on 08/15/2016.   Specialty:  Obstetrics and Gynecology Why:  Keep scheduled appointment Contact information: [address] [place] [address] [phone]

## 2016-08-09 NOTE — Discharge Instructions (Signed)
Round Ligament Pain Introduction The round ligament is a cord of muscle and tissue that helps to support the uterus. It can become a source of pain during pregnancy if it becomes stretched or twisted as the baby grows. The pain usually begins in the second trimester of pregnancy, and it can come and go until the baby is delivered. It is not a serious problem, and it does not cause harm to the baby. Round ligament pain is usually a short, sharp, and pinching pain, but it can also be a dull, lingering, and aching pain. The pain is felt in the lower side of the abdomen or in the groin. It usually starts deep in the groin and moves up to the outside of the hip area. Pain can occur with:  A sudden change in position.  Rolling over in bed.  Coughing or sneezing.  Physical activity. Follow these instructions at home: Watch your condition for any changes. Take these steps to help with your pain:  When the pain starts, relax. Then try:  Sitting down.  Flexing your knees up to your abdomen.  Lying on your side with one pillow under your abdomen and another pillow between your legs.  Sitting in a warm bath for 15-20 minutes or until the pain goes away.  Take over-the-counter and prescription medicines only as told by your health care provider.  Move slowly when you sit and stand.  Avoid long walks if they cause pain.  Stop or lessen your physical activities if they cause pain. Contact a health care provider if:  Your pain does not go away with treatment.  You feel pain in your back that you did not have before.  Your medicine is not helping. Get help right away if:  You develop a fever or chills.  You develop uterine contractions.  You develop vaginal bleeding.  You develop nausea or vomiting.  You develop diarrhea.  You have pain when you urinate. This information is not intended to replace advice given to you by your health care provider. Make sure you discuss any questions  you have with your health care provider. Document Released: 04/26/2008 Document Revised: 12/24/2015 Document Reviewed: 09/24/2014  2017 Elsevier  

## 2016-08-15 ENCOUNTER — Encounter: Payer: Medicaid Other | Admitting: Obstetrics & Gynecology

## 2016-08-29 ENCOUNTER — Encounter: Payer: Self-pay | Admitting: *Deleted

## 2016-08-30 ENCOUNTER — Other Ambulatory Visit: Payer: Self-pay | Admitting: Medical

## 2016-08-30 ENCOUNTER — Ambulatory Visit (INDEPENDENT_AMBULATORY_CARE_PROVIDER_SITE_OTHER): Payer: Medicaid Other | Admitting: Medical

## 2016-08-30 ENCOUNTER — Encounter (HOSPITAL_COMMUNITY): Payer: Self-pay

## 2016-08-30 ENCOUNTER — Encounter: Payer: Self-pay | Admitting: Medical

## 2016-08-30 ENCOUNTER — Ambulatory Visit (HOSPITAL_COMMUNITY)
Admission: RE | Admit: 2016-08-30 | Discharge: 2016-08-30 | Disposition: A | Discharge status: Home / Self Care | Payer: Medicaid Other | Source: Ambulatory Visit | Attending: Medical | Admitting: Medical

## 2016-08-30 VITALS — BP 98/74 | HR 99 | Wt 163.5 lb

## 2016-08-30 DIAGNOSIS — Z3689 Encounter for other specified antenatal screening: Secondary | ICD-10-CM | POA: Diagnosis not present

## 2016-08-30 DIAGNOSIS — O09522 Supervision of elderly multigravida, second trimester: Secondary | ICD-10-CM | POA: Diagnosis present

## 2016-08-30 DIAGNOSIS — O09292 Supervision of pregnancy with other poor reproductive or obstetric history, second trimester: Secondary | ICD-10-CM | POA: Diagnosis not present

## 2016-08-30 DIAGNOSIS — O99512 Diseases of the respiratory system complicating pregnancy, second trimester: Secondary | ICD-10-CM

## 2016-08-30 DIAGNOSIS — Z3A22 22 weeks gestation of pregnancy: Secondary | ICD-10-CM | POA: Insufficient documentation

## 2016-08-30 DIAGNOSIS — O0992 Supervision of high risk pregnancy, unspecified, second trimester: Secondary | ICD-10-CM

## 2016-08-30 DIAGNOSIS — Z641 Problems related to multiparity: Secondary | ICD-10-CM | POA: Diagnosis not present

## 2016-08-30 DIAGNOSIS — K219 Gastro-esophageal reflux disease without esophagitis: Secondary | ICD-10-CM | POA: Diagnosis not present

## 2016-08-30 DIAGNOSIS — Z8759 Personal history of other complications of pregnancy, childbirth and the puerperium: Secondary | ICD-10-CM | POA: Diagnosis not present

## 2016-08-30 DIAGNOSIS — J45909 Unspecified asthma, uncomplicated: Secondary | ICD-10-CM

## 2016-08-30 DIAGNOSIS — Z98891 History of uterine scar from previous surgery: Secondary | ICD-10-CM | POA: Insufficient documentation

## 2016-08-30 DIAGNOSIS — O0990 Supervision of high risk pregnancy, unspecified, unspecified trimester: Secondary | ICD-10-CM | POA: Insufficient documentation

## 2016-08-30 DIAGNOSIS — O09299 Supervision of pregnancy with other poor reproductive or obstetric history, unspecified trimester: Secondary | ICD-10-CM | POA: Insufficient documentation

## 2016-08-30 MED ORDER — PRENATAL 27-0.8 MG PO TABS: 1.0000 | ORAL_TABLET | Freq: Every day | ORAL | 6 refills | Status: DC

## 2016-08-30 MED ORDER — DOCUSATE SODIUM 250 MG PO CAPS: 250.0000 mg | ORAL_CAPSULE | Freq: Every day | ORAL | 0 refills | Status: DC | PRN

## 2016-08-30 MED ORDER — OMEPRAZOLE 20 MG PO CPDR: 20.0000 mg | DELAYED_RELEASE_CAPSULE | Freq: Every day | ORAL | 3 refills | Status: DC

## 2016-08-30 NOTE — Patient Instructions (Signed)
Second Trimester of Pregnancy The second trimester is from week 13 through week 28, month 4 through 6. This is often the time in pregnancy that you feel your best. Often times, morning sickness has lessened or quit. You may have more energy, and you may get hungry more often. Your unborn baby (fetus) is growing rapidly. At the end of the sixth month, he or she is about 9 inches long and weighs about 1 pounds. You will likely feel the baby move (quickening) between 18 and 20 weeks of pregnancy. Follow these instructions at home:  Avoid all smoking, herbs, and alcohol. Avoid drugs not approved by your doctor.  Do not use any tobacco products, including cigarettes, chewing tobacco, and electronic cigarettes. If you need help quitting, ask your doctor. You may get counseling or other support to help you quit.  Only take medicine as told by your doctor. Some medicines are safe and some are not during pregnancy.  Exercise only as told by your doctor. Stop exercising if you start having cramps.  Eat regular, healthy meals.  Wear a good support bra if your breasts are tender.  Do not use hot tubs, steam rooms, or saunas.  Wear your seat belt when driving.  Avoid raw meat, uncooked cheese, and liter boxes and soil used by cats.  Take your prenatal vitamins.  Take 1500-2000 milligrams of calcium daily starting at the 20th week of pregnancy until you deliver your baby.  Try taking medicine that helps you poop (stool softener) as needed, and if your doctor approves. Eat more fiber by eating fresh fruit, vegetables, and whole grains. Drink enough fluids to keep your pee (urine) clear or pale yellow.  Take warm water baths (sitz baths) to soothe pain or discomfort caused by hemorrhoids. Use hemorrhoid cream if your doctor approves.  If you have puffy, bulging veins (varicose veins), wear support hose. Raise (elevate) your feet for 15 minutes, 3-4 times a day. Limit salt in your diet.  Avoid heavy  lifting, wear low heals, and sit up straight.  Rest with your legs raised if you have leg cramps or low back pain.  Visit your dentist if you have not gone during your pregnancy. Use a soft toothbrush to brush your teeth. Be gentle when you floss.  You can have sex (intercourse) unless your doctor tells you not to.  Go to your doctor visits. Get help if:  You feel dizzy.  You have mild cramps or pressure in your lower belly (abdomen).  You have a nagging pain in your belly area.  You continue to feel sick to your stomach (nauseous), throw up (vomit), or have watery poop (diarrhea).  You have bad smelling fluid coming from your vagina.  You have pain with peeing (urination). Get help right away if:  You have a fever.  You are leaking fluid from your vagina.  You have spotting or bleeding from your vagina.  You have severe belly cramping or pain.  You lose or gain weight rapidly.  You have trouble catching your breath and have chest pain.  You notice sudden or extreme puffiness (swelling) of your face, hands, ankles, feet, or legs.  You have not felt the baby move in over an hour.  You have severe headaches that do not go away with medicine.  You have vision changes. This information is not intended to replace advice given to you by your health care provider. Make sure you discuss any questions you have with your health care   provider. Document Released: 10/12/2009 Document Revised: 12/24/2015 Document Reviewed: 09/18/2012 Elsevier Interactive Patient Education  2017 Elsevier Inc.  

## 2016-08-30 NOTE — Progress Notes (Signed)
   PRENATAL VISIT NOTE  Subjective:  Tamara Bradford is a 41 y.o. [id number] at 7346w6d being seen today for ongoing prenatal care.  She is currently monitored for the following issues for this high-risk pregnancy and has Severe persistent asthma; GERD (gastroesophageal reflux disease); Tobacco use disorder; Elderly multigravida in second trimester; History of gestational hypertension; Uterine scar from previous cesarean delivery affecting pregnancy; Grand multiparity; Current smoker; Supervision of high-risk pregnancy; History of placenta abruption; History of pre-eclampsia in prior pregnancy, currently pregnant in second trimester; and History of 3 cesarean sections on her problem list.  Patient reports URI symptoms, worsening of asthma symptoms, constipation and hemorrhoids .  Contractions: Not present. Vag. Bleeding: None.  Movement: Present. Denies leaking of fluid.   The following portions of the patient's history were reviewed and updated as appropriate: allergies, current medications, past family history, past medical history, past social history, past surgical history and problem list. Problem list updated.  Objective:   Vitals:   08/30/16 0938  BP: 98/74  Pulse: 99  Weight: 163 lb 8 oz (74.2 kg)    Fetal Status: Fetal Heart Rate (bpm): 154 Fundal Height: 22 cm Movement: Present     General:  Alert, oriented and cooperative. Patient is in no acute distress.  Skin: Skin is warm and dry. No rash noted.   Cardiovascular: Normal heart rate noted, normal rhythm  Respiratory: Normal respiratory effort, no problems with respiration noted, Normal breath sounds, no wheezes or adventitious sounds  Abdomen: Soft, gravid, appropriate for gestational age. Pain/Pressure: Absent     Pelvic:  Cervical exam deferred        Extremities: Normal range of motion.     Mental Status: Normal mood and affect. Normal behavior. Normal judgment and thought content.   Assessment and Plan:  Pregnancy:  [id number] at 4246w6d  1. Supervision of high risk pregnancy in second trimester - Prenatal Vit-Fe Fumarate-FA (MULTIVITAMIN-PRENATAL) 27-0.8 MG TABS tablet; Take 1 tablet by mouth daily at 12 noon.  Dispense: 30 each; Refill: 6 - US MFM OB COMP + 14 WK; scheduled  2. Grand multipara - US MFM OB COMP + 14 WK; Future  3. Gastroesophageal reflux disease without esophagitis - omeprazole (PRILOSEC) 20 MG capsule; Take 1 capsule (20 mg total) by mouth daily.  Dispense: 30 capsule; Refill: 3  4. History of placenta abruption  5. History of pre-eclampsia in prior pregnancy, currently pregnant in second trimester  6. History of 3 cesarean sections - Repeat C/S - Records requested from last OB  7. Constipation - docusate sodium (COLACE) 250 MG capsule; Take 1 capsule (250 mg total) by mouth daily as needed for constipation.  Dispense: 30 capsule; Refill: 0  8. Viral URI with asthma exacerbation - List of OTC medications safe in pregnancy for symptomatic relief given  - Advised to continue Albuterol PRN  Preterm labor symptoms and general obstetric precautions including but not limited to vaginal bleeding, contractions, leaking of fluid and fetal movement were reviewed in detail with the patient. Please refer to After Visit Summary for other counseling recommendations.  Return in about 4 weeks (around 09/27/2016) [facility], 2 hour GTT.   Marny LowensteinJulie N Bonita Brindisi, PA-C

## 2016-09-01 ENCOUNTER — Other Ambulatory Visit: Payer: Self-pay | Admitting: General Practice

## 2016-09-01 ENCOUNTER — Encounter: Payer: Self-pay | Admitting: General Practice

## 2016-09-21 ENCOUNTER — Ambulatory Visit: Payer: Medicaid Other | Admitting: Pulmonary Disease

## 2016-09-26 ENCOUNTER — Ambulatory Visit: Payer: Medicaid Other | Admitting: Adult Health

## 2016-09-26 ENCOUNTER — Encounter: Payer: Medicaid Other | Admitting: Obstetrics & Gynecology

## 2016-09-26 ENCOUNTER — Telehealth: Payer: Medicaid Other | Admitting: Nurse Practitioner

## 2016-09-26 DIAGNOSIS — R112 Nausea with vomiting, unspecified: Secondary | ICD-10-CM

## 2016-09-26 NOTE — Progress Notes (Signed)
We are sorry that you are not feeling well. Here is how we plan to help!  Based on what you have shared with me it looks like you have a Virus that is irritating your GI tract.  Vomiting is the forceful emptying of a portion of the stomach's content through the mouth.  Although nausea and vomiting can make you feel miserable, it's important to remember that these are not diseases, but rather symptoms of an underlying illness.  When we treat short term symptoms, we always caution that any symptoms that persist should be fully evaluated in a medical office.  I have prescribed a medication that will help alleviate your symptoms and allow you to stay hydrated: I am so sorry but you will need to contact your OB for medication- nausea meda can be dangerous at different trimesters. Sorry for the inconvience.   HOME CARE:  Drink clear liquids.  This is very important! Dehydration (the lack of fluid) can lead to a serious complication.  Start off with 1 tablespoon every 5 minutes for 8 hours.  You may begin eating bland foods after 8 hours without vomiting.  Start with saltine crackers, white bread, rice, mashed potatoes, applesauce.  After 48 hours on a bland diet, you may resume a normal diet.  Try to go to sleep.  Sleep often empties the stomach and relieves the need to vomit.  GET HELP RIGHT AWAY IF:   Your symptoms do not improve or worsen within 2 days after treatment.  You have a fever for over 3 days.  You cannot keep down fluids after trying the medication.  MAKE SURE YOU:   Understand these instructions.  Will watch your condition.  Will get help right away if you are not doing well or get worse.   Thank you for choosing an e-visit. Your e-visit answers were reviewed by a board certified advanced clinical practitioner to complete your personal care plan. Depending upon the condition, your plan could have included both over the counter or prescription medications. Please review  your pharmacy choice. Be sure that the pharmacy you have chosen is open so that you can pick up your prescription now.  If there is a problem you may message your provider in MyChart to have the prescription routed to another pharmacy. Your safety is important to us. If you have drug allergies check your prescription carefully.  For the next 24 hours, you can use MyChart to ask questions about today's visit, request a non-urgent call back, or ask for a work or school excuse from your e-visit provider. You will get an e-mail in the next two days asking about your experience. I hope that your e-visit has been valuable and will speed your recovery.

## 2016-10-04 ENCOUNTER — Ambulatory Visit (INDEPENDENT_AMBULATORY_CARE_PROVIDER_SITE_OTHER): Payer: Medicaid Other | Admitting: Obstetrics and Gynecology

## 2016-10-04 ENCOUNTER — Ambulatory Visit (INDEPENDENT_AMBULATORY_CARE_PROVIDER_SITE_OTHER): Payer: Medicaid Other | Admitting: Clinical

## 2016-10-04 VITALS — BP 120/69 | HR 80 | Wt 166.0 lb

## 2016-10-04 DIAGNOSIS — O0993 Supervision of high risk pregnancy, unspecified, third trimester: Secondary | ICD-10-CM

## 2016-10-04 DIAGNOSIS — F4323 Adjustment disorder with mixed anxiety and depressed mood: Secondary | ICD-10-CM | POA: Diagnosis not present

## 2016-10-04 DIAGNOSIS — Z23 Encounter for immunization: Secondary | ICD-10-CM | POA: Diagnosis not present

## 2016-10-04 DIAGNOSIS — Z3482 Encounter for supervision of other normal pregnancy, second trimester: Secondary | ICD-10-CM | POA: Diagnosis not present

## 2016-10-04 DIAGNOSIS — O09522 Supervision of elderly multigravida, second trimester: Secondary | ICD-10-CM

## 2016-10-04 MED ORDER — ASPIRIN EC 81 MG PO TBEC: 81.0000 mg | DELAYED_RELEASE_TABLET | Freq: Every day | ORAL | 3 refills | Status: DC

## 2016-10-04 NOTE — Patient Instructions (Signed)
Third Trimester of Pregnancy The third trimester is from week 28 through week 40 (months 7 through 9). The third trimester is a time when the unborn baby (fetus) is growing rapidly. At the end of the ninth month, the fetus is about 20 inches in length and weighs 6-10 pounds. Body changes during your third trimester Your body will continue to go through many changes during pregnancy. The changes vary from woman to woman. During the third trimester:  Your weight will continue to increase. You can expect to gain 25-35 pounds (11-16 kg) by the end of the pregnancy.  You may begin to get stretch marks on your hips, abdomen, and breasts.  You may urinate more often because the fetus is moving lower into your pelvis and pressing on your bladder.  You may develop or continue to have heartburn. This is caused by increased hormones that slow down muscles in the digestive tract.  You may develop or continue to have constipation because increased hormones slow digestion and cause the muscles that push waste through your intestines to relax.  You may develop hemorrhoids. These are swollen veins (varicose veins) in the rectum that can itch or be painful.  You may develop swollen, bulging veins (varicose veins) in your legs.  You may have increased body aches in the pelvis, back, or thighs. This is due to weight gain and increased hormones that are relaxing your joints.  You may have changes in your hair. These can include thickening of your hair, rapid growth, and changes in texture. Some women also have hair loss during or after pregnancy, or hair that feels dry or thin. Your hair will most likely return to normal after your baby is born.  Your breasts will continue to grow and they will continue to become tender. A yellow fluid (colostrum) may leak from your breasts. This is the first milk you are producing for your baby.  Your belly button may stick out.  You may notice more swelling in your hands,  face, or ankles.  You may have increased tingling or numbness in your hands, arms, and legs. The skin on your belly may also feel numb.  You may feel short of breath because of your expanding uterus.  You may have more problems sleeping. This can be caused by the size of your belly, increased need to urinate, and an increase in your body's metabolism.  You may notice the fetus "dropping," or moving lower in your abdomen (lightening).  You may have increased vaginal discharge.  You may notice your joints feel loose and you may have pain around your pelvic bone.  What to expect at prenatal visits You will have prenatal exams every 2 weeks until week 36. Then you will have weekly prenatal exams. During a routine prenatal visit:  You will be weighed to make sure you and the baby are growing normally.  Your blood pressure will be taken.  Your abdomen will be measured to track your baby's growth.  The fetal heartbeat will be listened to.  Any test results from the previous visit will be discussed.  You may have a cervical check near your due date to see if your cervix has softened or thinned (effaced).  You will be tested for Group B streptococcus. This happens between 35 and 37 weeks.  Your health care provider may ask you:  What your birth plan is.  How you are feeling.  If you are feeling the baby move.  If you have had   any abnormal symptoms, such as leaking fluid, bleeding, severe headaches, or abdominal cramping.  If you are using any tobacco products, including cigarettes, chewing tobacco, and electronic cigarettes.  If you have any questions.  Other tests or screenings that may be performed during your third trimester include:  Blood tests that check for low iron levels (anemia).  Fetal testing to check the health, activity level, and growth of the fetus. Testing is done if you have certain medical conditions or if there are problems during the  pregnancy.  Nonstress test (NST). This test checks the health of your baby to make sure there are no signs of problems, such as the baby not getting enough oxygen. During this test, a belt is placed around your belly. The baby is made to move, and its heart rate is monitored during movement.  What is false labor? False labor is a condition in which you feel small, irregular tightenings of the muscles in the womb (contractions) that usually go away with rest, changing position, or drinking water. These are called Braxton Hicks contractions. Contractions may last for hours, days, or even weeks before true labor sets in. If contractions come at regular intervals, become more frequent, increase in intensity, or become painful, you should see your health care provider. What are the signs of labor?  Abdominal cramps.  Regular contractions that start at 10 minutes apart and become stronger and more frequent with time.  Contractions that start on the top of the uterus and spread down to the lower abdomen and back.  Increased pelvic pressure and dull back pain.  A watery or bloody mucus discharge that comes from the vagina.  Leaking of amniotic fluid. This is also known as your "water breaking." It could be a slow trickle or a gush. Let your health care provider know if it has a color or strange odor. If you have any of these signs, call your health care provider right away, even if it is before your due date. Follow these instructions at home: Medicines  Follow your health care provider's instructions regarding medicine use. Specific medicines may be either safe or unsafe to take during pregnancy.  Take a prenatal vitamin that contains at least 600 micrograms (mcg) of folic acid.  If you develop constipation, try taking a stool softener if your health care provider approves. Eating and drinking  Eat a balanced diet that includes fresh fruits and vegetables, whole grains, good sources of protein  such as meat, eggs, or tofu, and low-fat dairy. Your health care provider will help you determine the amount of weight gain that is right for you.  Avoid raw meat and uncooked cheese. These carry germs that can cause birth defects in the baby.  If you have low calcium intake from food, talk to your health care provider about whether you should take a daily calcium supplement.  Eat four or five small meals rather than three large meals a day.  Limit foods that are high in fat and processed sugars, such as fried and sweet foods.  To prevent constipation: ? Drink enough fluid to keep your urine clear or pale yellow. ? Eat foods that are high in fiber, such as fresh fruits and vegetables, whole grains, and beans. Activity  Exercise only as directed by your health care provider. Most women can continue their usual exercise routine during pregnancy. Try to exercise for 30 minutes at least 5 days a week. Stop exercising if you experience uterine contractions.  Avoid heavy   lifting.  Do not exercise in extreme heat or humidity, or at high altitudes.  Wear low-heel, comfortable shoes.  Practice good posture.  You may continue to have sex unless your health care provider tells you otherwise. Relieving pain and discomfort  Take frequent breaks and rest with your legs elevated if you have leg cramps or low back pain.  Take warm sitz baths to soothe any pain or discomfort caused by hemorrhoids. Use hemorrhoid cream if your health care provider approves.  Wear a good support bra to prevent discomfort from breast tenderness.  If you develop varicose veins: ? Wear support pantyhose or compression stockings as told by your healthcare provider. ? Elevate your feet for 15 minutes, 3-4 times a day. Prenatal care  Write down your questions. Take them to your prenatal visits.  Keep all your prenatal visits as told by your health care provider. This is important. Safety  Wear your seat belt at  all times when driving.  Make a list of emergency phone numbers, including numbers for family, friends, the hospital, and police and fire departments. General instructions  Avoid cat litter boxes and soil used by cats. These carry germs that can cause birth defects in the baby. If you have a cat, ask someone to clean the litter box for you.  Do not travel far distances unless it is absolutely necessary and only with the approval of your health care provider.  Do not use hot tubs, steam rooms, or saunas.  Do not drink alcohol.  Do not use any products that contain nicotine or tobacco, such as cigarettes and e-cigarettes. If you need help quitting, ask your health care provider.  Do not use any medicinal herbs or unprescribed drugs. These chemicals affect the formation and growth of the baby.  Do not douche or use tampons or scented sanitary pads.  Do not cross your legs for long periods of time.  To prepare for the arrival of your baby: ? Take prenatal classes to understand, practice, and ask questions about labor and delivery. ? Make a trial run to the hospital. ? Visit the hospital and tour the maternity area. ? Arrange for maternity or paternity leave through employers. ? Arrange for family and friends to take care of pets while you are in the hospital. ? Purchase a rear-facing car seat and make sure you know how to install it in your car. ? Pack your hospital bag. ? Prepare the baby's nursery. Make sure to remove all pillows and stuffed animals from the baby's crib to prevent suffocation.  Visit your dentist if you have not gone during your pregnancy. Use a soft toothbrush to brush your teeth and be gentle when you floss. Contact a health care provider if:  You are unsure if you are in labor or if your water has broken.  You become dizzy.  You have mild pelvic cramps, pelvic pressure, or nagging pain in your abdominal area.  You have lower back pain.  You have persistent  nausea, vomiting, or diarrhea.  You have an unusual or bad smelling vaginal discharge.  You have pain when you urinate. Get help right away if:  Your water breaks before 37 weeks.  You have regular contractions less than 5 minutes apart before 37 weeks.  You have a fever.  You are leaking fluid from your vagina.  You have spotting or bleeding from your vagina.  You have severe abdominal pain or cramping.  You have rapid weight loss or weight gain.    You have shortness of breath with chest pain.  You notice sudden or extreme swelling of your face, hands, ankles, feet, or legs.  Your baby makes fewer than 10 movements in 2 hours.  You have severe headaches that do not go away when you take medicine.  You have vision changes. Summary  The third trimester is from week 28 through week 40, months 7 through 9. The third trimester is a time when the unborn baby (fetus) is growing rapidly.  During the third trimester, your discomfort may increase as you and your baby continue to gain weight. You may have abdominal, leg, and back pain, sleeping problems, and an increased need to urinate.  During the third trimester your breasts will keep growing and they will continue to become tender. A yellow fluid (colostrum) may leak from your breasts. This is the first milk you are producing for your baby.  False labor is a condition in which you feel small, irregular tightenings of the muscles in the womb (contractions) that eventually go away. These are called Braxton Hicks contractions. Contractions may last for hours, days, or even weeks before true labor sets in.  Signs of labor can include: abdominal cramps; regular contractions that start at 10 minutes apart and become stronger and more frequent with time; watery or bloody mucus discharge that comes from the vagina; increased pelvic pressure and dull back pain; and leaking of amniotic fluid. This information is not intended to replace advice  given to you by your health care provider. Make sure you discuss any questions you have with your health care provider. Document Released: 07/12/2001 Document Revised: 12/24/2015 Document Reviewed: 09/18/2012 Elsevier Interactive Patient Education  2017 [employer]  

## 2016-10-04 NOTE — BH Specialist Note (Signed)
Session Start time: 10:00  End Time: 10:20 Total Time:  20 minutes Type of Service: Behavioral Health - Individual/Family Interpreter: No.   Interpreter Name & Language: n/a # [facility]  SUBJECTIVE: Tamara Bradford is a 41 y.o. female  Pt. was referred by Dr Genevie AnnSchenk for:  depression and possible DV. Pt. reports the following symptoms/concerns: Pt states that she is mildly concerned about being tired and sleeping too much, and attributing it to needing extra sleep prior to childbirth; pt requesting materials to read about coping with symptoms of depression. FOB came into room, so did not inquire about DV. Duration of problem:  Over one month Severity: moderate Previous treatment: Yes, pt [place]  OBJECTIVE: Mood: Appropriate & Affect: Appropriate Risk of harm to self or others: No known risk of harm to self or others Assessments administered: PHQ9:11/ GAD7: 0  LIFE CONTEXT:  Family & Social: Pt lives with FOB, 40yo and 40yo  School/ Work: FOB starting new job soon Self-Care: Oversleeping, "wake up to eat and go back to sleep" Life changes: Current pregnancy What is important to pt/family (values): Healthy baby  GOALS ADDRESSED:  -Reduce symptoms of depression  INTERVENTIONS: Motivational Interviewing   ASSESSMENT:  Pt currently experiencing Adjustment disorder with depressed mood.  Pt may benefit from psychoeducation and brief therapeutic intervention regarding coping with symptoms of depression.   PLAN: 1. F/U with behavioral health clinician: One month, or as needed 2. Behavioral Health meds: none 3. Behavioral recommendations:  -Read educational material regarding coping with depression (and anxiety/panic, at pts' request) -Consider using Postpartum Planner to plan for postpartum, with family 4. Referral: Brief Counseling/Psychotherapy and Psychoeducation 5. From scale of 1-10, how likely are you to follow  plan: 10  [facility] Clin[facility]  Behavioral Health Clinician  Marlon PelWarmhandoff:   Warm Hand Off Completed.        Depression screen PHQ 2/9 10/04/2016  Decreased Interest 2  Down, Depressed, Hopeless 0  PHQ - 2 Score 2  Altered sleeping 3  Tired, decreased energy 3  Change in appetite 3  Feeling bad or failure about yourself  0  Trouble concentrating 0  Moving slowly or fidgety/restless 0  Suicidal thoughts 0  PHQ-9 Score 11   GAD 7 : Generalized Anxiety Score 10/04/2016  Nervous, Anxious, on Edge 0  Control/stop worrying 0  Worry too much - different things 0  Trouble relaxing 0  Restless 0  Easily annoyed or irritable 0  Afraid - awful might happen 0  Total GAD 7 Score 0

## 2016-10-04 NOTE — Progress Notes (Signed)
28 week labs today.  

## 2016-10-04 NOTE — Progress Notes (Signed)
   PRENATAL VISIT NOTE  Subjective:  Tamara Bradford is a 41 y.o. [id number] at 4514w6d being seen today for ongoing prenatal care.  She is currently monitored for the following issues for this high-risk pregnancy and has Severe persistent asthma; GERD (gastroesophageal reflux disease); Tobacco use disorder; Elderly multigravida in second trimester; History of gestational hypertension; Uterine scar from previous cesarean delivery affecting pregnancy; Grand multiparity; Current smoker; Supervision of high-risk pregnancy; History of placenta abruption; History of pre-eclampsia in prior pregnancy, currently pregnant in second trimester; and History of 3 cesarean sections on her problem list.  Patient reports no complaints.  Contractions: Not present. Vag. Bleeding: None.  Movement: Present. Denies leaking of fluid.   The following portions of the patient's history were reviewed and updated as appropriate: allergies, current medications, past family history, past medical history, past social history, past surgical history and problem list. Problem list updated.  Objective:   Vitals:   10/04/16 0826  BP: 120/69  Pulse: 80  Weight: 166 lb (75.3 kg)    Fetal Status: Fetal Heart Rate (bpm): 155   Movement: Present     General:  Alert, oriented and cooperative. Patient is in no acute distress.  Skin: Skin is warm and dry. No rash noted.   Cardiovascular: Normal heart rate noted  Respiratory: Normal respiratory effort, no problems with respiration noted, CTAB  Abdomen: Soft, gravid, appropriate for gestational age. Pain/Pressure: Present     Pelvic:  Cervical exam deferred        Extremities: Normal range of motion.     Mental Status: Normal mood and affect. Normal behavior. Normal judgment and thought content.   Assessment and Plan:  Pregnancy: [id number] at 914w6d  1. Encounter for supervision of other normal pregnancy in second trimester - Glucose Tolerance, 2 Hours w/1 Hour - CBC - RPR -  HIV antibody (with reflex) - Flu Vaccine QUAD 36+ mos IM (Fluarix, Quad PF) - Tdap vaccine greater than or equal to 7yo IM - US MFM OB FOLLOW UP; Future  2. AMA - patient scheduled for 28 wk growth ultrasound.   3. Smoking in pregnancy Discussed cessation smoking two cigs a day  4. multi gravity and previous c-section x2 - discussed extensively risks of delivery with patient including extensive bleeding and risk of hysterectomy. Discussed BTL and patient declines. They are considering vasectomy.   Preterm labor symptoms and general obstetric precautions including but not limited to vaginal bleeding, contractions, leaking of fluid and fetal movement were reviewed in detail with the patient. Please refer to After Visit Summary for other counseling recommendations.  Return in about 4 weeks (around 11/01/2016).   Lorne SkeensNicholas Michael Tegan Britain, MD

## 2016-10-05 LAB — GLUCOSE TOLERANCE, 2 HOURS W/ 1HR
GLUCOSE, FASTING: 82 mg/dL (ref 65–91)
Glucose, 1 hour: 152 mg/dL (ref 65–179)
Glucose, 2 hour: 116 mg/dL (ref 65–152)

## 2016-10-05 LAB — RPR: RPR Ser Ql: NONREACTIVE

## 2016-10-05 LAB — CBC
HEMATOCRIT: 30.9 % — AB (ref 34.0–46.6)
Hemoglobin: 10.9 g/dL — ABNORMAL LOW (ref 11.1–15.9)
MCH: 30.7 pg (ref 26.6–33.0)
MCHC: 35.3 g/dL (ref 31.5–35.7)
MCV: 87 fL (ref 79–97)
PLATELETS: 257 10*3/uL (ref 150–379)
RBC: 3.55 x10E6/uL — AB (ref 3.77–5.28)
RDW: 14.4 % (ref 12.3–15.4)
WBC: 7.5 10*3/uL (ref 3.4–10.8)

## 2016-10-05 LAB — HIV ANTIBODY (ROUTINE TESTING W REFLEX): HIV Screen 4th Generation wRfx: NONREACTIVE

## 2016-10-15 ENCOUNTER — Encounter (HOSPITAL_COMMUNITY): Payer: Self-pay

## 2016-10-15 ENCOUNTER — Inpatient Hospital Stay (HOSPITAL_COMMUNITY)
Admission: AD | Admit: 2016-10-15 | Discharge: 2016-10-15 | Disposition: A | Discharge status: Home / Self Care | Payer: Medicaid Other | Source: Ambulatory Visit | Attending: Obstetrics & Gynecology | Admitting: Obstetrics & Gynecology

## 2016-10-15 DIAGNOSIS — Z79899 Other long term (current) drug therapy: Secondary | ICD-10-CM | POA: Diagnosis not present

## 2016-10-15 DIAGNOSIS — Z3A29 29 weeks gestation of pregnancy: Secondary | ICD-10-CM | POA: Diagnosis not present

## 2016-10-15 DIAGNOSIS — O26893 Other specified pregnancy related conditions, third trimester: Secondary | ICD-10-CM | POA: Diagnosis present

## 2016-10-15 DIAGNOSIS — Z8759 Personal history of other complications of pregnancy, childbirth and the puerperium: Secondary | ICD-10-CM | POA: Diagnosis not present

## 2016-10-15 DIAGNOSIS — F17210 Nicotine dependence, cigarettes, uncomplicated: Secondary | ICD-10-CM | POA: Insufficient documentation

## 2016-10-15 DIAGNOSIS — O09292 Supervision of pregnancy with other poor reproductive or obstetric history, second trimester: Secondary | ICD-10-CM

## 2016-10-15 DIAGNOSIS — O09523 Supervision of elderly multigravida, third trimester: Secondary | ICD-10-CM | POA: Diagnosis not present

## 2016-10-15 DIAGNOSIS — Z3689 Encounter for other specified antenatal screening: Secondary | ICD-10-CM | POA: Diagnosis not present

## 2016-10-15 DIAGNOSIS — F17200 Nicotine dependence, unspecified, uncomplicated: Secondary | ICD-10-CM

## 2016-10-15 DIAGNOSIS — O99333 Smoking (tobacco) complicating pregnancy, third trimester: Secondary | ICD-10-CM | POA: Diagnosis not present

## 2016-10-15 DIAGNOSIS — R109 Unspecified abdominal pain: Secondary | ICD-10-CM | POA: Diagnosis present

## 2016-10-15 DIAGNOSIS — O4703 False labor before 37 completed weeks of gestation, third trimester: Secondary | ICD-10-CM

## 2016-10-15 DIAGNOSIS — Z7982 Long term (current) use of aspirin: Secondary | ICD-10-CM | POA: Diagnosis not present

## 2016-10-15 LAB — WET PREP, GENITAL
Clue Cells Wet Prep HPF POC: NONE SEEN
Sperm: NONE SEEN
Trich, Wet Prep: NONE SEEN
YEAST WET PREP: NONE SEEN

## 2016-10-15 MED ORDER — NIFEDIPINE 10 MG PO CAPS
10.0000 mg | ORAL_CAPSULE | ORAL | Status: AC
  Administered 2016-10-15: 10 mg via ORAL
  Filled 2016-10-15: qty 1

## 2016-10-15 MED ORDER — LACTATED RINGERS IV BOLUS (SEPSIS)
1000.0000 mL | Freq: Once | INTRAVENOUS | Status: AC
  Administered 2016-10-15: 1000 mL via INTRAVENOUS

## 2016-10-15 NOTE — MAU Note (Signed)
Nurse notified by phlebotomy that pt didn't want her blood drawn, nurse in to talk with pt, explained that what test we were wanting and why we needed them. Pt States " I do not want to be stuck". Pt declines lab draw.
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
## 2016-10-15 NOTE — MAU Note (Signed)
Fabian NovemberM Bhambri CNM in to reassess pt. Discussed need for lab work and urine collection. Pt still refuses to have labs collected.
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
## 2016-10-15 NOTE — MAU Note (Signed)
Offered  To order lunch for pt, pt declined, lying in bed with covers over her head. Denies further needs. MSW called to ensure a visit before pt is discharged.

## 2016-10-15 NOTE — MAU Note (Signed)
Fabian NovemberM. Bhambri CNM notified of pt's c/o ctx and vaginal pressure. Requested to come evaluate pt.
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
## 2016-10-15 NOTE — Discharge Instructions (Signed)

## 2016-10-17 ENCOUNTER — Ambulatory Visit (HOSPITAL_COMMUNITY): Admission: RE | Admit: 2016-10-17 | Payer: Medicaid Other | Source: Ambulatory Visit

## 2016-10-17 LAB — GC/CHLAMYDIA PROBE AMP (~~LOC~~) NOT AT ARMC
Chlamydia: NEGATIVE
NEISSERIA GONORRHEA: NEGATIVE

## 2016-10-28 ENCOUNTER — Observation Stay (HOSPITAL_COMMUNITY)
Admission: AD | Admit: 2016-10-28 | Discharge: 2016-10-29 | Disposition: A | Discharge status: Home / Self Care | Payer: Medicaid Other | Source: Ambulatory Visit | Attending: Obstetrics and Gynecology | Admitting: Obstetrics and Gynecology

## 2016-10-28 ENCOUNTER — Emergency Department (HOSPITAL_COMMUNITY)
Admission: EM | Admit: 2016-10-28 | Discharge: 2016-10-28 | Discharge status: Absent without leave | Payer: Medicaid Other

## 2016-10-28 ENCOUNTER — Inpatient Hospital Stay (HOSPITAL_COMMUNITY): Payer: Medicaid Other

## 2016-10-28 ENCOUNTER — Encounter (HOSPITAL_COMMUNITY): Payer: Self-pay | Admitting: *Deleted

## 2016-10-28 DIAGNOSIS — O99333 Smoking (tobacco) complicating pregnancy, third trimester: Secondary | ICD-10-CM | POA: Diagnosis not present

## 2016-10-28 DIAGNOSIS — Y939 Activity, unspecified: Secondary | ICD-10-CM | POA: Insufficient documentation

## 2016-10-28 DIAGNOSIS — J45909 Unspecified asthma, uncomplicated: Secondary | ICD-10-CM | POA: Insufficient documentation

## 2016-10-28 DIAGNOSIS — Y999 Unspecified external cause status: Secondary | ICD-10-CM | POA: Insufficient documentation

## 2016-10-28 DIAGNOSIS — O133 Gestational [pregnancy-induced] hypertension without significant proteinuria, third trimester: Secondary | ICD-10-CM | POA: Diagnosis not present

## 2016-10-28 DIAGNOSIS — Z7982 Long term (current) use of aspirin: Secondary | ICD-10-CM | POA: Insufficient documentation

## 2016-10-28 DIAGNOSIS — Z3A31 31 weeks gestation of pregnancy: Secondary | ICD-10-CM | POA: Diagnosis not present

## 2016-10-28 DIAGNOSIS — O9A213 Injury, poisoning and certain other consequences of external causes complicating pregnancy, third trimester: Principal | ICD-10-CM | POA: Insufficient documentation

## 2016-10-28 DIAGNOSIS — S299XXA Unspecified injury of thorax, initial encounter: Secondary | ICD-10-CM | POA: Insufficient documentation

## 2016-10-28 DIAGNOSIS — Y929 Unspecified place or not applicable: Secondary | ICD-10-CM | POA: Diagnosis not present

## 2016-10-28 DIAGNOSIS — W108XXA Fall (on) (from) other stairs and steps, initial encounter: Secondary | ICD-10-CM | POA: Diagnosis not present

## 2016-10-28 DIAGNOSIS — F17210 Nicotine dependence, cigarettes, uncomplicated: Secondary | ICD-10-CM | POA: Diagnosis not present

## 2016-10-28 DIAGNOSIS — S298XXA Other specified injuries of thorax, initial encounter: Secondary | ICD-10-CM

## 2016-10-28 MED ORDER — DOCUSATE SODIUM 100 MG PO CAPS
100.0000 mg | ORAL_CAPSULE | Freq: Every day | ORAL | Status: DC
  Filled 2016-10-28: qty 1

## 2016-10-28 MED ORDER — CALCIUM CARBONATE ANTACID 500 MG PO CHEW: 2.0000 | CHEWABLE_TABLET | ORAL | Status: DC | PRN

## 2016-10-28 MED ORDER — ACETAMINOPHEN 325 MG PO TABS
650.0000 mg | ORAL_TABLET | ORAL | Status: DC | PRN
  Administered 2016-10-29 (×2): 650 mg via ORAL
  Filled 2016-10-28 (×2): qty 2

## 2016-10-28 MED ORDER — PRENATAL MULTIVITAMIN CH
1.0000 | ORAL_TABLET | Freq: Every day | ORAL | Status: DC
  Filled 2016-10-28: qty 1

## 2016-10-28 MED ORDER — ZOLPIDEM TARTRATE 5 MG PO TABS: 5.0000 mg | ORAL_TABLET | Freq: Every evening | ORAL | Status: DC | PRN

## 2016-10-28 MED ORDER — HYDROMORPHONE HCL 1 MG/ML IJ SOLN
1.0000 mg | Freq: Once | INTRAMUSCULAR | Status: AC
  Administered 2016-10-28: 1 mg via INTRAMUSCULAR
  Filled 2016-10-28: qty 1
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
## 2016-10-28 NOTE — MAU Note (Signed)
In process of moving. About 1800 fell down 5-6 steps. Slid down about 3 steps and then fell forward and hit L side on wooden crate. L side from lower ribs upward. Did not hit abdomen. Denies LOF, bleeding, d/c since fall. Good FM since then. Hurts on L side to take deep breath

## 2016-10-29 ENCOUNTER — Encounter (HOSPITAL_COMMUNITY): Payer: Self-pay | Admitting: *Deleted

## 2016-10-29 LAB — TYPE AND SCREEN
ABO/RH(D): O POS
ANTIBODY SCREEN: NEGATIVE

## 2016-10-29 LAB — CBC
HEMATOCRIT: 31.3 % — AB (ref 36.0–46.0)
HEMOGLOBIN: 10.5 g/dL — AB (ref 12.0–15.0)
MCH: 30.3 pg (ref 26.0–34.0)
MCHC: 33.5 g/dL (ref 30.0–36.0)
MCV: 90.5 fL (ref 78.0–100.0)
Platelets: 237 10*3/uL (ref 150–400)
RBC: 3.46 MIL/uL — AB (ref 3.87–5.11)
RDW: 15.1 % (ref 11.5–15.5)
WBC: 12.7 10*3/uL — AB (ref 4.0–10.5)

## 2016-10-29 NOTE — Progress Notes (Signed)
Pt states she's going walking.  Pt reminded that she informed Dr. Emelda Fear she would speak with SW prior to discharge.  Pt informed SW notified this am and would be into see her by 0900.  Pt states she will return to room @ 0900. RN also informed pt that discharge papers needed to be reviewed & given prior to discharge.  Pt verbalized understanding & agreeable.

## 2016-10-29 NOTE — Progress Notes (Signed)
Pt requesting to go outside, informed MD must be notified to obtain order first.  Dr. Leota Jacobsen called, informed of pt's request.  MD states pt to be discharged home now and may leave unit upon discharge.
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
## 2016-10-29 NOTE — Progress Notes (Signed)
EFM removed by pt, states she wants to get up and walk around.  MD to be notified regarding order.

## 2016-10-29 NOTE — H&P (Signed)
Attestation signed by Tilda Burrow, MD at 10/28/2016 11:34 PM  `````Attestation of Attending Supervision of Advanced Practitioner: Evaluation and management procedures were performed by the PA/NP/CNM/OB Fellow under my supervision/collaboration. Chart reviewed and agree with management and plan.  Tilda Burrow 10/28/2016 11:31 PM   Patient interviewed and examination of chest and abdomen performed. Pt fell only 1 step , per her report and partner's report.  Pt has mild discomfort in left axilla where she reportedly struck a shelf as she fell. Pt spontaneously reports that she caught herself as she fell using her arms, and that she does NOT believe that the abdomen was struck. Will monitor overnight with EFM and Toco due to the spontaneous decel noted just before the u/s where BPP is 6/8.  FHR cat I and no decels noted at present.      Hide copied text Hover for attribution information  History   CSN: [id number]  Arrival date and time: 10/28/16 4098   First Provider Initiated Contact with Patient 10/28/16 2015         Chief Complaint  Patient presents with  . Fall   HPI [name] is a 41 y.o. [id number] at [redacted]w[redacted]d who presents to [facility]U today with complaint of fall at home. The patient states that she slipped on the steps while moving boxes, they are in the process of moving. She slid down 5-6 steps and then fell forward and hit her left chest on a wooden crate. She denies abdominal trauma, vaginal bleeding, LOF or regular contractions. She reports good fetal movement.            OB History    Gravida Para Term Preterm AB Living   SAB TAB Ectopic Multiple Live Births           75          Past Medical History:  Diagnosis Date  . Asthma   . Depression   . Pregnancy induced hypertension   . Preterm labor          Past Surgical History:  Procedure Laterality Date  . CESAREAN SECTION           Family History    Problem Relation Age of Onset  . Prostate cancer Father   . Colon cancer Father   . GER disease Brother   . Lung disease Neg Hx           Social History  Substance Use Topics  . Smoking status: Current Some Day Smoker    Packs/day: 0.25    Years: 22.00    Types: Cigarettes    Start date: 08/05/1993  . Smokeless tobacco: Current User     Comment: Peak rate of 1ppd - Quit for 1 year previously  . Alcohol use No     Comment: Occasionally - but none now    Allergies:      Allergies  Allergen Reactions  . Cinnamon Hives  . Keflex [Cephalexin] Hives  . Naproxen Rash           Prescriptions Prior to Admission  Medication Sig Dispense Refill Last Dose  . albuterol (PROVENTIL HFA;VENTOLIN HFA) 108 (90 Base) MCG/ACT inhaler Inhale 2 puffs into the lungs every 6 (six) hours as needed for wheezing or shortness of breath.   Past Week at Unknown time  . aspirin EC 81 MG tablet Take 1 tablet (81 mg total) by mouth daily. 30 tablet 3 10/27/2016  at Unknown time  . omeprazole (PRILOSEC) 20 MG capsule Take 1 capsule (20 mg total) by mouth daily. 30 capsule 3 10/27/2016 at Unknown time  . Prenatal Vit-Fe Fumarate-FA (MULTIVITAMIN-PRENATAL) 27-0.8 MG TABS tablet Take 1 tablet by mouth daily at 12 noon. 30 each 6 10/27/2016 at Unknown time    Review of Systems Physical Exam   Blood pressure 118/85, pulse 86, temperature 98.2 F (36.8 C), resp. rate 20, height  (1.702 m), weight 168 lb (76.2 kg), last menstrual period 03/23/2016, SpO2 100 %.  Physical Exam  Dg Chest 2 View  Result Date: 10/28/2016 CLINICAL DATA:  Blunt force trauma to the anterior chest. Status post fall down stairs. Initial encounter. EXAM: CHEST  2 VIEW COMPARISON:  None. FINDINGS: The lungs are well-aerated and clear. There is no evidence of focal opacification, pleural effusion or pneumothorax. The heart is normal in size; the mediastinal contour is within normal limits. No acute  osseous abnormalities are seen. IMPRESSION: No acute cardiopulmonary process seen. No displaced rib fractures identified. Electronically Signed   By: Roanna Raider M.D.   On: 10/28/2016 21:09    Fetal Monitoring: Baseline: 135 bpm  Variability: moderate  Accelerations: 10 x 10  Decelerations: one, prolonged Contractions: none, mild UI  MAU Course  Procedures None  MDM  Chest XRay today - no evidence of displaced rib fracture After prolonged deceleration, minimal variability and fetal tachycardia noted. BPP and AFI ordered.  BPP 6/8 and AFI normal. No evidence of placental abruption.  Discussed patient with Dr. Emelda Fear. Admit for observation overnight given traumatic nature of fall. Continuous monitoring.   Assessment and Plan  A: SIUP at [redacted]w[redacted]d Chest trauma, initial encounter  Traumatic injury in pregnancy, third trimester   P: Admit to [facility] for observation overnight with continuous monitoring  Marny Lowenstein, PA-C  10/28/2016, 11:23 PM

## 2016-10-29 NOTE — Progress Notes (Signed)
Pt awaiting SW.  Will be discharged after consult completed.

## 2016-10-31 ENCOUNTER — Inpatient Hospital Stay (HOSPITAL_COMMUNITY)
Admission: AD | Admit: 2016-10-31 | Discharge: 2016-10-31 | Disposition: A | Discharge status: Home / Self Care | Payer: Medicaid Other | Source: Ambulatory Visit | Attending: Obstetrics & Gynecology | Admitting: Obstetrics & Gynecology

## 2016-10-31 ENCOUNTER — Encounter (HOSPITAL_COMMUNITY): Payer: Self-pay

## 2016-10-31 DIAGNOSIS — K219 Gastro-esophageal reflux disease without esophagitis: Secondary | ICD-10-CM | POA: Insufficient documentation

## 2016-10-31 DIAGNOSIS — O9989 Other specified diseases and conditions complicating pregnancy, childbirth and the puerperium: Secondary | ICD-10-CM | POA: Diagnosis not present

## 2016-10-31 DIAGNOSIS — O99513 Diseases of the respiratory system complicating pregnancy, third trimester: Secondary | ICD-10-CM | POA: Diagnosis not present

## 2016-10-31 DIAGNOSIS — O99613 Diseases of the digestive system complicating pregnancy, third trimester: Secondary | ICD-10-CM | POA: Insufficient documentation

## 2016-10-31 DIAGNOSIS — M791 Myalgia: Secondary | ICD-10-CM | POA: Diagnosis not present

## 2016-10-31 DIAGNOSIS — Z3A32 32 weeks gestation of pregnancy: Secondary | ICD-10-CM | POA: Diagnosis not present

## 2016-10-31 DIAGNOSIS — J4540 Moderate persistent asthma, uncomplicated: Secondary | ICD-10-CM | POA: Insufficient documentation

## 2016-10-31 DIAGNOSIS — M7918 Myalgia, other site: Secondary | ICD-10-CM

## 2016-10-31 DIAGNOSIS — O9A213 Injury, poisoning and certain other consequences of external causes complicating pregnancy, third trimester: Secondary | ICD-10-CM

## 2016-10-31 DIAGNOSIS — O0992 Supervision of high risk pregnancy, unspecified, second trimester: Secondary | ICD-10-CM

## 2016-10-31 DIAGNOSIS — O0993 Supervision of high risk pregnancy, unspecified, third trimester: Secondary | ICD-10-CM | POA: Insufficient documentation

## 2016-10-31 LAB — RAPID URINE DRUG SCREEN, HOSP PERFORMED
AMPHETAMINES: NOT DETECTED
BENZODIAZEPINES: NOT DETECTED
Barbiturates: NOT DETECTED
Cocaine: NOT DETECTED
OPIATES: NOT DETECTED
TETRAHYDROCANNABINOL: NOT DETECTED

## 2016-10-31 LAB — URINALYSIS, ROUTINE W REFLEX MICROSCOPIC
Bilirubin Urine: NEGATIVE
Glucose, UA: NEGATIVE mg/dL
Ketones, ur: NEGATIVE mg/dL
Nitrite: NEGATIVE
Protein, ur: NEGATIVE mg/dL
SPECIFIC GRAVITY, URINE: 1.009 (ref 1.005–1.030)
pH: 6 (ref 5.0–8.0)

## 2016-10-31 MED ORDER — BUDESONIDE 180 MCG/ACT IN AEPB: 2.0000 | INHALATION_SPRAY | Freq: Two times a day (BID) | RESPIRATORY_TRACT | Status: DC

## 2016-10-31 MED ORDER — IPRATROPIUM-ALBUTEROL 0.5-2.5 (3) MG/3ML IN SOLN
3.0000 mL | Freq: Four times a day (QID) | RESPIRATORY_TRACT | Status: DC
  Administered 2016-10-31: 3 mL via RESPIRATORY_TRACT
  Filled 2016-10-31 (×5): qty 3

## 2016-10-31 MED ORDER — OXYCODONE-ACETAMINOPHEN 5-325 MG PO TABS
2.0000 | ORAL_TABLET | Freq: Once | ORAL | Status: AC
  Administered 2016-10-31: 2 via ORAL
  Filled 2016-10-31: qty 2

## 2016-10-31 MED ORDER — BUDESONIDE 180 MCG/ACT IN AEPB: 2.0000 | INHALATION_SPRAY | Freq: Two times a day (BID) | RESPIRATORY_TRACT | 10 refills | Status: DC

## 2016-10-31 MED ORDER — OXYCODONE-ACETAMINOPHEN 5-325 MG PO TABS: 1.0000 | ORAL_TABLET | Freq: Four times a day (QID) | ORAL | 0 refills | Status: DC | PRN

## 2016-10-31 MED ORDER — GI COCKTAIL ~~LOC~~
30.0000 mL | ORAL | Status: AC
  Administered 2016-10-31: 30 mL via ORAL
  Filled 2016-10-31: qty 30

## 2016-10-31 MED ORDER — OMEPRAZOLE 20 MG PO CPDR: 20.0000 mg | DELAYED_RELEASE_CAPSULE | Freq: Two times a day (BID) | ORAL | 5 refills | Status: DC
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
## 2016-10-31 NOTE — MAU Note (Signed)
Pt C/O HA with blurred vision that started this morning, has epigastric pain after eating - first noted this last night.  Has abd discomfort that is intermittent, denies bleeding or LOF.  Has yellow discharge.

## 2016-10-31 NOTE — Discharge Summary (Deleted)
Due to severe pre-maturity parents opted for no interventions for their infant. Infant was allowed to pass naturally.   Parents did not opt for autopsy.  Ernestina Penna, MD 10/31/16 1153

## 2016-10-31 NOTE — Discharge Instructions (Signed)
°Pelvic pain (SPD) °Approved by the [employer] °Share ° ° °In this article °What is symphysis pubis dysfunction?  °What are the symptoms of SPD?  °What causes SPD?  °How is SPD diagnosed?  °How is SPD treated?  °What can I do to ease the pain of SPD?  °Will I recover from SPD after I’ve had my baby ?  °Where can I get help and support? °Video  °Your pelvic floor °Finding and exercising this crucial area. What is symphysis pubis dysfunction?Symphysis pubis dysfunction (SPD) is a problem with the pelvis. Your pelvis is mainly formed of two pubic bones that curve round to make a cradle shape. The pubic bones meet at the front of your pelvis, at a firm joint called the symphysis pubis.  ° °The joint's connection is made strong by a dense network of tough tissues (ligaments). During pregnancy, swelling and pain can make the symphysis pubis joint less stable, causing SPD. ° °Doctors and physiotherapists classify any type of pelvic pain during pregnancy as pelvic girdle pain (PGP).  ° °SPD is one type of pelvic girdle pain. Diastasis symphysis pubis (DSP) is another type of pelvic girdle pain, which is related to SPD. DSP happens when the gap in the symphysis pubis joint widens too far. DSP is rare, and can only be diagnosed by an X-ray, ultrasound scan or MRI scan. What are the symptoms of SPD? Pain in the pubic area and groin are the most common symptoms, though you may also notice:  ° °Back pain, pain at the back of your pelvis or hip pain.  °Pain, along with a grinding or clicking sensation in your pubic area.  °Pain down the inside of your thighs or between your legs.  °Pain that's made worse by parting your legs, walking, going up or down stairs or moving around in bed.  °Pain that's worse at night and stops you from sleeping well. Getting up to go to the toilet in the middle of the night can be especially painful. ° °SPD can occur at any time during your pregnancy or after giving birth. You  may notice it for the first time during the middle of your pregnancy. What causes SPD?During pregnancy, your body produces a hormone called relaxin, which softens your ligaments to help your baby pass through your pelvis. This means that the joints in your pelvis naturally become more lax.  ° °However, this flexibility doesn't necessarily cause the painful problems of SPD. Usually, your nerves and muscles are able to adapt and compensate for the greater flexibility in your joints. This means your body should cope well with the changes to your posture as your baby grows. ° °SPD is thought to happen when your body doesn't adapt so well to the stretchier, looser ligaments caused by relaxin. SPD can be triggered by: ° °the joints in your pelvis moving unevenly  °changes to the way your muscles work to support your pelvic girdle joints  °one pelvic joint not working properly and causing knock-on pain in the other joints of your pelvis ° °These problems mean that your pelvis is not as stable as it should be, and this is what causes SPD. Physiotherapy is the best way to treat SPD, because it's about the relationship between your muscles and bones, rather than how lax your joints are. You're more likely to develop SPD if: ° °you had pelvic girdle pain or pelvic joint pain before you became pregnant  °you've had a previous injury to your   pelvis  °you've had pelvic girdle pain in a previous pregnancy  °you have a high BMI and were overweight before you became pregnant  °hypermobility in all your joints  °How is SPD diagnosed? Your doctor or midwife should refer you to a women’s health physiotherapist. Your physiotherapist will test the stability, movement and pain in your pelvic joints and muscles. How is SPD treated?SPD is managed in the same way as other pelvic girdle pain. Treatment includes:  ° °Exercises to strengthen your spinal, tummy, pelvic girdle, hip and pelvic floor muscles. These will improve the stability of  your pelvis and back. You may need gentle, hands-on treatment of your hip, back or pelvis to correct stiffness or imbalance. Water gymnastics can sometimes help.  °Your physiotherapist should advise you on how to make daily activities less painful and on how to make the birth of your baby easier. Your midwife should help you to write a birth plan that takes into account your SPD symptoms.  °Acupuncture may help reduce the pain and is safe during pregnancy. Make sure your practitioner is trained and experienced in working with pregnant women.  °Other manual therapies, such as osteopathy may help. See a registered practitioner who is experienced in treating pregnant women.  °A pelvic support belt may give relief, particularly when you're exercising or active. °What can I do to ease the pain of SPD?  °Be as active as you can, but don't push yourself so far that it hurts.  °Stick to the pelvic floor and tummy exercises that your physiotherapist recommends.  °Ask for and accept offers of help with daily chores.  °Plan ahead so that you reduce the activities that cause you problems. You could use a rucksack to carry things around, both indoors and out.  °Take care to part your legs no further than your pain-free range, particularly when getting in and out of the car, bed or bath. If you are lying down, pull up your knees as far as you can to make it easier to part your legs. If you are sitting, try arching your back and sticking your chest out before parting or moving your legs.  °Avoid activities that make your pain worse or that put your pelvis in an uneven position, such as sitting cross-legged or carrying your toddler on your hip. If something hurts, stop doing it. If the pain is allowed to flare up, it can take a long time to settle down again.  °Try to sleep on your side with legs bent and a pillow between your knees.  °Rest regularly or sit down for activities you would normally do standing, such as ironing. By  sitting on a birth ball or by getting down on your hands and knees, you'll take the weight of your baby off your pelvis.  °Try not to do heavy lifting or pushing. Pushing supermarket trolleys can often make your pain worse, so shop online or ask someone to shop for you.  °When climbing stairs, take one step at a time. Step up onto one step with your best leg and then bring your other leg to meet it. Repeat with each step.  °Avoid standing on one leg. When getting dressed, sit down to pull on your knickers or trousers. °Will I recover from SPD after I’ve had my baby ? You’re very likely to recover within a few weeks to a few months after your baby is born. If you can, carry on with physiotherapy after the birth. Try to get   help with looking after your baby during the early weeks.  ° °You may find you get twinges every month just before your period is due. This is likely to be caused by hormones that have a similar effect to pregnancy hormones.  ° °If you have SPD in one pregnancy, it is more likely that you’ll have it next time you get pregnant. Ask your midwife to refer you to a physiotherapist early on. SPD may not necessarily be as bad next time if it is well managed from the start of pregnancy.  ° °You could consider giving yourself a bit of time from one pregnancy to the next. Losing excess weight, getting fit and waiting until your children can walk may help reduce the symptoms of getting any type of pelvic pain next time. Where can I get help and support?The [employer] of [employer] in [employer]’s [employer] can provide a list of physiotherapists in your area. ° °You can get in touch with other women in your situation by contacting [employer] [facility], a charity that offers support to women with pelvic girdle pain, including SPD, or by visiting our community.  ° °See our photo guide to pregnancy stretches, designed to help relieve those aches and pains.  ° °Last reviewed: July 2015 °

## 2016-10-31 NOTE — MAU Provider Note (Signed)
Chief Complaint:  Headache and Abdominal Pain   First Provider Initiated Contact with Patient 10/31/16 1402      HPI: Tamara Bradford is a 41 y.o. [id number] at [redacted]w[redacted]d who presents to maternity admissions reporting blurred vision and swelling of her hands and feet with onset today. She also reports pain in her left upper chest from her recent fall down stairs that is constant and unchanged, not improved since her discharge from the hospital on 3/31.  She reports wheezing and shortness of breath daily x 1-2 weeks, despite using her albuterol inhaler 1-2x daily. She reports her asthma is not well controlled, and she does not have a primary care provider currently who manages this.  She also reports significant pain in her low abdomen and into her left leg, worsened by movement, walking, and getting out of bed that started 3-4 weeks ago but has worsened in the last 2-3 days.  This pain is making it difficult to get out of bed or the car or to walk.  She has tried Tylenol, rest, heat, but nothing has helped.  Pt was admitted to hospital for her injuries from fall last week. SW saw her during her hospital stay and she declines to see SW today. She denies any safety issues in her home and denies any violence by her partner or anyone else. She reports good fetal movement, denies LOF, vaginal bleeding, vaginal itching/burning, urinary symptoms, h/a, dizziness, n/v, or fever/chills.    HPI  Past Medical History: Past Medical History:  Diagnosis Date  . Asthma   . Depression   . Pregnancy induced hypertension   . Preterm labor     Past obstetric history: OB History  Gravida Para Term Preterm AB Living  SAB TAB Ectopic Multiple Live Births          16    # Outcome Date GA Lbr Len/2nd Weight Sex Delivery Anes PTL Lv  17 Current           16 Term 04/13/15    F CS-LTranv   LIV  15 Term 04/23/13    F CS-LTranv   LIV  14 Term 01/17/12     CS-LTranv   LIV     Complications: Abruptio  Placenta  13 Term 12/22/10    M Vag-Spont   LIV  12 Term 09/13/09    F Vag-Spont   LIV  11 Term 03/11/08    F Vag-Spont   LIV  10 Term 05/13/05    M Vag-Spont   DEC  9 Term 03/22/03    M Vag-Spont   LIV  8 Term 10/12/00    M Vag-Spont   LIV  7 Term 02/17/00    F Vag-Spont   LIV  6 Term 07/10/98    F Vag-Spont   LIV  5 Term 04/1996     Vag-Spont   ND     Complications: SIDS (sudden infant death syndrome)  4 Term February 26, 1995    F Vag-Spont   LIV  3 Term 11/10/93    F Vag-Spont   LIV  2 Term 08/08/92    M Vag-Spont   LIV  1 Preterm 09/01/88    M Vag-Spont  Baldemar Lenis      Past Surgical History: Past Surgical History:  Procedure Laterality Date  . CESAREAN SECTION      Family History: Family History  Problem Relation Age of Onset  . Prostate cancer Father   .  Colon cancer Father   . GER disease Brother   . Lung disease Neg Hx     Social History: Social History  Substance Use Topics  . Smoking status: Current Some Day Smoker    Packs/day: 0.25    Years: 22.00    Types: Cigarettes    Start date: 08/05/1993  . Smokeless tobacco: Current User     Comment: Peak rate of 1ppd - Quit for 1 year previously  . Alcohol use No     Comment: Occasionally - but none now    Allergies:  Allergies  Allergen Reactions  . Cinnamon Hives  . Keflex [Cephalexin] Hives  . Naproxen Rash    Meds:  Prescriptions Prior to Admission  Medication Sig Dispense Refill Last Dose  . albuterol (PROVENTIL HFA;VENTOLIN HFA) 108 (90 Base) MCG/ACT inhaler Inhale 2 puffs into the lungs every 6 (six) hours as needed for wheezing or shortness of breath.   10/31/2016 at Unknown time  . aspirin EC 81 MG tablet Take 1 tablet (81 mg total) by mouth daily. 30 tablet 3 10/31/2016 at Unknown time  . omeprazole (PRILOSEC) 20 MG capsule Take 1 capsule (20 mg total) by mouth daily. 30 capsule 3 10/31/2016 at Unknown time  . Prenatal Vit-Fe Fumarate-FA (MULTIVITAMIN-PRENATAL) 27-0.8 MG TABS tablet Take 1 tablet by mouth daily at  12 noon. 30 each 6 10/31/2016 at Unknown time    ROS:  Review of Systems  Constitutional: Negative for chills, fatigue and fever.  Eyes: Negative for visual disturbance.  Respiratory: Negative for shortness of breath.   Cardiovascular: Positive for chest pain.  Gastrointestinal: Negative for abdominal pain, nausea and vomiting.  Genitourinary: Positive for pelvic pain. Negative for difficulty urinating, dysuria, flank pain, vaginal bleeding, vaginal discharge and vaginal pain.  Musculoskeletal: Positive for back pain.  Neurological: Negative for dizziness and headaches.  Psychiatric/Behavioral: Negative.      I have reviewed patient's Past Medical Hx, Surgical Hx, Family Hx, Social Hx, medications and allergies.   Physical Exam  Patient Vitals for the past 24 hrs:  BP Temp Temp src Pulse Resp SpO2  10/31/16 1425 - - - (!) 103 - 98 %  10/31/16 1416 (!) 96/26 - - (!) 107 - 99 %  10/31/16 1402 (!) 110/58 - - (!) 105 - -  10/31/16 1401 - - - (!) 109 - 97 %  10/31/16 1321 115/78 98.2 F (36.8 C) Oral (!) 104 18 -   Constitutional: Well-developed, well-nourished female in no acute distress.  HEART: normal rate, heart sounds, regular rhythm RESP: normal effort, lung sounds clear and equal bilaterally Chest: tenderness to palpation along ribs at breast level and around to mid/upper back GI: Abd soft, non-tender, gravid appropriate for gestational age.  MS: Extremities nontender, no edema, normal ROM Neurologic: Alert and oriented x 4.  GU: Neg CVAT.  PELVIC EXAM: Deferred     FHT:  Baseline 145, moderate variability, accelerations present, no decelerations Contractions: None on toco or to palpation   Labs: No results found for this or any previous visit (from the past 24 hour(s)). --/--/O POS (03/30 2333)  Imaging:  Dg Chest 2 View  Result Date: 10/28/2016 CLINICAL DATA:  Blunt force trauma to the anterior chest. Status post fall down stairs. Initial encounter. EXAM: CHEST   2 VIEW COMPARISON:  None. FINDINGS: The lungs are well-aerated and clear. There is no evidence of focal opacification, pleural effusion or pneumothorax. The heart is normal in size; the mediastinal contour is within normal  limits. No acute osseous abnormalities are seen. IMPRESSION: No acute cardiopulmonary process seen. No displaced rib fractures identified. Electronically Signed   By: Roanna Raider M.D.   On: 10/28/2016 21:09    MAU Course/MDM: I have ordered labs and reviewed results.  NST reviewed Consult Dr Tenny Craw, cardiologist for EKG reading.  ST changes c/w with previous EKGs, no recommended follow up at this time.   Pain in left part of chest c/w musculoskeletal pain from previous fall Pain in pelvis c/w pubic symphysis pain Will give Percocet 5/325 x 2 tabs in MAU, respiratory to administer nebulizer breathing treatment Rest/ice/heat/Pregnancy support belt/Tylenol/Flexeril at home Add Pulmicort inhaler BID to asthma management F/U in office as scheduled, return to MAU as needed for emergencies Urine sent for culture Pt stable at time of discharge.   Assessment:  1. Moderate persistent asthma without complication   2. Pain in symphysis pubis during pregnancy   3. Gastroesophageal reflux disease, esophagitis presence not specified   4. Traumatic injury during pregnancy in third trimester   5. Supervision of high risk pregnancy in second trimester   6. Gastroesophageal reflux disease without esophagitis    Plan: Discharge home Labor precautions and fetal kick counts   Allergies as of 10/31/2016      Reactions   Cinnamon Hives   Keflex [cephalexin] Hives   Naproxen Rash      Medication List    TAKE these medications   albuterol 108 (90 Base) MCG/ACT inhaler Commonly known as:  PROVENTIL HFA;VENTOLIN HFA Inhale 2 puffs into the lungs every 6 (six) hours as needed for wheezing or shortness of breath.   aspirin EC 81 MG tablet Take 1 tablet (81 mg total) by mouth  daily.   budesonide 180 MCG/ACT inhaler Commonly known as:  PULMICORT Inhale 2 puffs into the lungs 2 (two) times daily.   multivitamin-prenatal 27-0.8 MG Tabs tablet Take 1 tablet by mouth daily at 12 noon.   omeprazole 20 MG capsule Commonly known as:  PRILOSEC Take 1 capsule (20 mg total) by mouth 2 (two) times daily before a meal. What changed:  when to take this   oxyCODONE-acetaminophen 5-325 MG tablet Commonly known as:  PERCOCET/ROXICET Take 1-2 tablets by mouth every 6 (six) hours as needed.       Sharen Counter Certified Nurse-Midwife 10/31/2016 2:44 PM

## 2016-11-02 ENCOUNTER — Encounter: Payer: Self-pay | Admitting: Obstetrics and Gynecology

## 2016-11-03 ENCOUNTER — Ambulatory Visit (INDEPENDENT_AMBULATORY_CARE_PROVIDER_SITE_OTHER): Payer: Medicaid Other | Admitting: Obstetrics and Gynecology

## 2016-11-03 VITALS — BP 108/68 | HR 108 | Wt 169.7 lb

## 2016-11-03 DIAGNOSIS — O0993 Supervision of high risk pregnancy, unspecified, third trimester: Secondary | ICD-10-CM | POA: Diagnosis not present

## 2016-11-03 DIAGNOSIS — Z8759 Personal history of other complications of pregnancy, childbirth and the puerperium: Secondary | ICD-10-CM

## 2016-11-03 DIAGNOSIS — O09523 Supervision of elderly multigravida, third trimester: Secondary | ICD-10-CM

## 2016-11-03 DIAGNOSIS — J4520 Mild intermittent asthma, uncomplicated: Secondary | ICD-10-CM

## 2016-11-03 DIAGNOSIS — N736 Female pelvic peritoneal adhesions (postinfective): Secondary | ICD-10-CM | POA: Diagnosis not present

## 2016-11-03 DIAGNOSIS — Z98891 History of uterine scar from previous surgery: Secondary | ICD-10-CM

## 2016-11-03 LAB — POCT URINALYSIS DIP (DEVICE)
BILIRUBIN URINE: NEGATIVE
GLUCOSE, UA: NEGATIVE mg/dL
KETONES UR: NEGATIVE mg/dL
LEUKOCYTES UA: NEGATIVE
Nitrite: NEGATIVE
PROTEIN: NEGATIVE mg/dL
SPECIFIC GRAVITY, URINE: 1.015 (ref 1.005–1.030)
Urobilinogen, UA: 1 mg/dL (ref 0.0–1.0)
pH: 7 (ref 5.0–8.0)

## 2016-11-03 NOTE — Progress Notes (Signed)
Prenatal Visit Note Date: 11/03/2016 Clinic: [facility]  Subjective:  Tamara Bradford is a 41 y.o. [id number] at [redacted]w[redacted]d being seen today for ongoing prenatal care.  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  Patient reports patient still sore on left chest wall from fall; pt seen on 3/30 in MAU Contractions: Irritability. Vag. Bleeding: None.  Movement: Present. Denies leaking of fluid.   The following portions of the patient's history were reviewed and updated as appropriate: allergies, current medications, past family history, past medical history, past social history, past surgical history and problem list. Problem list updated.  Objective:   Vitals:   11/03/16 1355  BP: 108/68  Pulse: (!) 108  Weight: 169 lb 11.2 oz (77 kg)    Fetal Status: Fetal Heart Rate (bpm): 128 Fundal Height: 33 cm Movement: Present     General:  Alert, oriented and cooperative. Patient is in no acute distress.  Skin: Skin is warm and dry. No rash noted.   Cardiovascular: Normal heart rate noted  Respiratory: Normal respiratory effort, no problems with respiration noted  Abdomen: Soft, gravid, appropriate for gestational age. Pain/Pressure: Absent     Pelvic:  Cervical exam deferred        Extremities: Normal range of motion.  Edema: None  Mental Status: Normal mood and affect. Normal behavior. Normal judgment and thought  content.   Urinalysis:      Assessment and Plan:  Pregnancy: [id number] at [redacted]w[redacted]d  1. AMA (advanced maternal age) multigravida 35+, third trimester Needs serial growths set up and d/w her re: ap testing starting at 36wks. Will set up growth for sometime in the next week - [place] MFM OB FOLLOW UP; Future  2. Supervision of high risk pregnancy in third trimester Routine care. Pt desires BTL but she forgot and left before signing today. RN to call her and have her come down to clinic on u/s day to sign BTL paperwork  3. History of 3 cesarean sections Pt states they noted scar tissue at her last c-section [facility] in [place], [place]) and she states that for next c-section something about "delivering the baby through the bladder." she is [facility] transfer so will see if op note is in those records. Schedule rpt at 39wks. Pt states prefers Sunday or Monday due to FOB work schedule. [facility] records and I don't see any op notes. Stick note and PL updated to have pt sign MR release.   4. Mild intermittent asthma without complication On no meds; no issues  Preterm labor symptoms and general obstetric precautions including but not limited to vaginal bleeding, contractions, leaking of fluid and fetal movement were reviewed in detail with the patient. Please refer to After Visit Summary for other counseling recommendations.  Return in about 2 weeks (around 11/17/2016) for rob.   [place] Bing, MD

## 2016-11-07 ENCOUNTER — Telehealth: Payer: Self-pay | Admitting: *Deleted

## 2016-11-07 NOTE — Telephone Encounter (Signed)
Called patient to notify her of follow up u/s on 4/18 @ 3:30pm. She also needs to schedule her 2 week f/u appointment in the clinic. Attempted to call patient, Mobile # was busy, no way to leave message. Home # rang with no answer and no vm. Will try to call later. Per Dr Vergie Living, patient can come in anytime to sign BTL papers.

## 2016-11-09 ENCOUNTER — Encounter: Payer: Self-pay | Admitting: General Practice

## 2016-11-10 NOTE — Telephone Encounter (Signed)
Called patient at home number, no answer and no option to leave voicemail. Mobile number was busy when I attempted to call. Will inform patient of ultrasound appt at prenatal visit on 4/17

## 2016-11-11 ENCOUNTER — Telehealth: Payer: Self-pay | Admitting: Family Medicine

## 2016-11-11 NOTE — Telephone Encounter (Signed)
Called patient on both numbers. No voicemail, and no answer. Needed to tell patient her appointment was changed due to provider not being in the office.

## 2016-11-14 ENCOUNTER — Inpatient Hospital Stay (HOSPITAL_COMMUNITY)
Admission: AD | Admit: 2016-11-14 | Discharge: 2016-11-15 | Disposition: A | Discharge status: Home / Self Care | Payer: Medicaid Other | Source: Ambulatory Visit | Attending: Obstetrics and Gynecology | Admitting: Obstetrics and Gynecology

## 2016-11-14 ENCOUNTER — Encounter (HOSPITAL_COMMUNITY): Payer: Self-pay | Admitting: Advanced Practice Midwife

## 2016-11-14 DIAGNOSIS — O99333 Smoking (tobacco) complicating pregnancy, third trimester: Secondary | ICD-10-CM | POA: Diagnosis not present

## 2016-11-14 DIAGNOSIS — O34219 Maternal care for unspecified type scar from previous cesarean delivery: Secondary | ICD-10-CM | POA: Diagnosis not present

## 2016-11-14 DIAGNOSIS — J45909 Unspecified asthma, uncomplicated: Secondary | ICD-10-CM | POA: Diagnosis not present

## 2016-11-14 DIAGNOSIS — Z8042 Family history of malignant neoplasm of prostate: Secondary | ICD-10-CM | POA: Diagnosis not present

## 2016-11-14 DIAGNOSIS — O99343 Other mental disorders complicating pregnancy, third trimester: Secondary | ICD-10-CM | POA: Insufficient documentation

## 2016-11-14 DIAGNOSIS — F329 Major depressive disorder, single episode, unspecified: Secondary | ICD-10-CM | POA: Diagnosis not present

## 2016-11-14 DIAGNOSIS — O99513 Diseases of the respiratory system complicating pregnancy, third trimester: Secondary | ICD-10-CM | POA: Diagnosis not present

## 2016-11-14 DIAGNOSIS — N898 Other specified noninflammatory disorders of vagina: Secondary | ICD-10-CM | POA: Insufficient documentation

## 2016-11-14 DIAGNOSIS — Z3A33 33 weeks gestation of pregnancy: Secondary | ICD-10-CM | POA: Diagnosis present

## 2016-11-14 DIAGNOSIS — Z888 Allergy status to other drugs, medicaments and biological substances status: Secondary | ICD-10-CM | POA: Insufficient documentation

## 2016-11-14 DIAGNOSIS — Z79899 Other long term (current) drug therapy: Secondary | ICD-10-CM | POA: Diagnosis not present

## 2016-11-14 DIAGNOSIS — O26893 Other specified pregnancy related conditions, third trimester: Secondary | ICD-10-CM | POA: Insufficient documentation

## 2016-11-14 DIAGNOSIS — R079 Chest pain, unspecified: Secondary | ICD-10-CM | POA: Insufficient documentation

## 2016-11-14 DIAGNOSIS — Z800 Family history of malignant neoplasm of digestive organs: Secondary | ICD-10-CM | POA: Insufficient documentation

## 2016-11-14 DIAGNOSIS — Z7982 Long term (current) use of aspirin: Secondary | ICD-10-CM | POA: Insufficient documentation

## 2016-11-14 DIAGNOSIS — Z8379 Family history of other diseases of the digestive system: Secondary | ICD-10-CM | POA: Insufficient documentation

## 2016-11-14 DIAGNOSIS — R102 Pelvic and perineal pain: Secondary | ICD-10-CM | POA: Diagnosis not present

## 2016-11-14 DIAGNOSIS — O4703 False labor before 37 completed weeks of gestation, third trimester: Secondary | ICD-10-CM | POA: Diagnosis not present

## 2016-11-14 DIAGNOSIS — Z801 Family history of malignant neoplasm of trachea, bronchus and lung: Secondary | ICD-10-CM | POA: Insufficient documentation

## 2016-11-14 DIAGNOSIS — F17210 Nicotine dependence, cigarettes, uncomplicated: Secondary | ICD-10-CM | POA: Insufficient documentation

## 2016-11-14 LAB — URINALYSIS, ROUTINE W REFLEX MICROSCOPIC
Bilirubin Urine: NEGATIVE
GLUCOSE, UA: NEGATIVE mg/dL
Hgb urine dipstick: NEGATIVE
Ketones, ur: NEGATIVE mg/dL
LEUKOCYTES UA: NEGATIVE
NITRITE: NEGATIVE
PH: 6 (ref 5.0–8.0)
PROTEIN: NEGATIVE mg/dL
Specific Gravity, Urine: 1.01 (ref 1.005–1.030)

## 2016-11-14 MED ORDER — LACTATED RINGERS IV BOLUS (SEPSIS)
1000.0000 mL | Freq: Once | INTRAVENOUS | Status: AC
  Administered 2016-11-14: 1000 mL via INTRAVENOUS

## 2016-11-14 MED ORDER — NIFEDIPINE 10 MG PO CAPS
10.0000 mg | ORAL_CAPSULE | ORAL | Status: AC
  Administered 2016-11-14 (×3): 10 mg via ORAL
  Filled 2016-11-14 (×3): qty 1

## 2016-11-14 NOTE — MAU Provider Note (Signed)
History     CSN: [id number]  Arrival date and time: 11/14/16 2148   First Provider Initiated Contact with Patient 11/14/16 2225      Chief Complaint  Patient presents with  . Chest Pain  . Contractions   Tamara Bradford is a 41 y.o. [id number] at [redacted]w[redacted]d who presents today with contractions and chest pain. She states that she fell about 2 week ago, and since then she has had some intermittent pain in the chest where she fell. She has been taking percocet for the pain, and it has been helping some. She also started having contractions yesterday. She denies any VB or LOF. She confirms normal fetal movement.    Pelvic Pain  The patient's primary symptoms include pelvic pain. The patient's pertinent negatives include no vaginal discharge. This is a new problem. The current episode started today. The problem occurs intermittently (about every 5-7 mins ). The problem has been unchanged. Pain severity now: 7/10  The problem affects both sides. She is pregnant. Pertinent negatives include no abdominal pain, chills, dysuria, fever, nausea, urgency or vomiting. The vaginal discharge was normal. There has been no bleeding. Nothing aggravates the symptoms. She has tried nothing for the symptoms. Sexual activity: Patient has had intercourse in the last 24 hours.   Chest Pain   This is a new problem. The current episode started 1 to 4 weeks ago. The problem occurs intermittently. The problem has been unchanged. The pain is present in the lateral region. The pain is at a severity of 6/10. The pain does not radiate. Pertinent negatives include no abdominal pain, fever, nausea or vomiting.    Past Medical History:  Diagnosis Date  . Asthma   . Depression   . Pregnancy induced hypertension   . Preterm labor     Past Surgical History:  Procedure Laterality Date  . CESAREAN SECTION      Family History  Problem Relation Age of Onset  . Prostate cancer Father   . Colon cancer Father   . GER disease  Brother   . Lung disease Neg Hx     Social History  Substance Use Topics  . Smoking status: Current Some Day Smoker    Packs/day: 0.25    Years: 22.00    Types: Cigarettes    Start date: 08/05/1993  . Smokeless tobacco: Current User     Comment: Peak rate of 1ppd - Quit for 1 year previously  . Alcohol use No     Comment: Occasionally - but none now    Allergies:  Allergies  Allergen Reactions  . Cinnamon Hives  . Keflex [Cephalexin] Hives  . Naproxen Rash    Prescriptions Prior to Admission  Medication Sig Dispense Refill Last Dose  . albuterol (PROVENTIL HFA;VENTOLIN HFA) 108 (90 Base) MCG/ACT inhaler Inhale 2 puffs into the lungs every 6 (six) hours as needed for wheezing or shortness of breath.   Taking  . aspirin EC 81 MG tablet Take 1 tablet (81 mg total) by mouth daily. 30 tablet 3 Taking  . budesonide (PULMICORT) 180 MCG/ACT inhaler Inhale 2 puffs into the lungs 2 (two) times daily. 1 Inhaler 10 Taking  . omeprazole (PRILOSEC) 20 MG capsule Take 1 capsule (20 mg total) by mouth 2 (two) times daily before a meal. 30 capsule 5 Taking  . oxyCODONE-acetaminophen (PERCOCET/ROXICET) 5-325 MG tablet Take 1-2 tablets by mouth every 6 (six) hours as needed. 10 tablet 0 Taking  . Prenatal Vit-Fe Fumarate-FA (MULTIVITAMIN-PRENATAL) 27-0.8 MG TABS  tablet Take 1 tablet by mouth daily at 12 noon. 30 each 6 Taking    Review of Systems  Constitutional: Negative for chills and fever.  Cardiovascular: Positive for chest pain.  Gastrointestinal: Negative for abdominal pain, nausea and vomiting.  Genitourinary: Positive for pelvic pain. Negative for dysuria, urgency, vaginal bleeding and vaginal discharge.   Physical Exam   Blood pressure 120/77, pulse 99, temperature 97.8 F (36.6 C), resp. rate 18, last menstrual period 03/23/2016.  Physical Exam  Nursing note and vitals reviewed. Constitutional: She is oriented to person, place, and time. She appears well-developed and  well-nourished. No distress.  HENT:  Head: Normocephalic.  Cardiovascular: Normal rate.   Respiratory: Effort normal.  GI: Soft. There is no tenderness. There is no rebound.  Genitourinary:  Genitourinary Comments: Cervix:FT/thick/-2   Neurological: She is alert and oriented to person, place, and time.  Skin: Skin is warm and dry.  Psychiatric: She has a normal mood and affect.   FHT: 145, moderate with 15x15 accels, no decels Toco q 5-7 mins   EKG: NSR  MAU Course  Procedures  MDM Patient has had 1L of LR nad 3 doses of procardia. Contractions no longer tracing on the monitor. Patient states that she still feels contractions. Palpated abdomen during the time she expressed she was having a contraction. No contraction palpated. Cervix: unchanged.  Assessment and Plan   1. Threatened premature labor in third trimester   2. [redacted] weeks gestation of pregnancy    DC home Comfort measures reviewed  3rd Trimester precautions  PTL precautions  Fetal kick counts RX: none  Return to MAU as needed FU with OB as planned  Follow-up Information    [facility].   Specialty:  Obstetrics and Gynecology Contact information: [address] [place] [address] [phone]           [name] 11/14/2016, 10:26 PM

## 2016-11-14 NOTE — MAU Note (Signed)
Pt reports sharp, stabbing chest pain that started yesterday-rates 7/10. Took Tylenol about 3 hours ago-helped somewhat. States she has SOB sometimes. Pt also has reports of contractions that have increased in frequency and are about every 7 mins. Pt denies vaginal bleeding or LOF. +FM.

## 2016-11-15 ENCOUNTER — Encounter: Payer: Self-pay | Admitting: Obstetrics and Gynecology

## 2016-11-15 DIAGNOSIS — O4703 False labor before 37 completed weeks of gestation, third trimester: Secondary | ICD-10-CM

## 2016-11-15 NOTE — Discharge Instructions (Signed)
Preterm Labor and Birth Information °Pregnancy normally lasts 39-41 weeks. Preterm labor is when labor starts early. It starts before you have been pregnant for 37 whole weeks. °What are the risk factors for preterm labor? °Preterm labor is more likely to occur in women who: °· Have an infection while pregnant. °· Have a cervix that is short. °· Have gone into preterm labor before. °· Have had surgery on their cervix. °· Are younger than age 40. °· Are older than age 35. °· Are African American. °· Are pregnant with two or more babies. °· Take street drugs while pregnant. °· Smoke while pregnant. °· Do not gain enough weight while pregnant. °· Got pregnant right after another pregnancy. °What are the symptoms of preterm labor? °Symptoms of preterm labor include: °· Cramps. The cramps may feel like the cramps some women get during their period. The cramps may happen with watery poop (diarrhea). °· Pain in the belly (abdomen). °· Pain in the lower back. °· Regular contractions or tightening. It may feel like your belly is getting tighter. °· Pressure in the lower belly that seems to get stronger. °· More fluid (discharge) leaking from the vagina. The fluid may be watery or bloody. °· Water breaking. °Why is it important to notice signs of preterm labor? °Babies who are born early may not be fully developed. They have a higher chance for: °· Long-term heart problems. °· Long-term lung problems. °· Trouble controlling body systems, like breathing. °· Bleeding in the brain. °· A condition called cerebral palsy. °· Learning difficulties. °· Death. °These risks are highest for babies who are born before 34 weeks of pregnancy. °How is preterm labor treated? °Treatment depends on: °· How long you were pregnant. °· Your condition. °· The health of your baby. °Treatment may involve: °· Having a stitch (suture) placed in your cervix. When you give birth, your cervix opens so the baby can come out. The stitch keeps the cervix  from opening too soon. °· Staying at the hospital. °· Taking or getting medicines, such as: °¨ Hormone medicines. °¨ Medicines to stop contractions. °¨ Medicines to help the baby’s lungs develop. °¨ Medicines to prevent your baby from having cerebral palsy. °What should I do if I am in preterm labor? °If you think you are going into labor too soon, call your doctor right away. °How can I prevent preterm labor? °· Do not use any tobacco products. °¨ Examples of these are cigarettes, chewing tobacco, and e-cigarettes. °¨ If you need help quitting, ask your doctor. °· Do not use street drugs. °· Do not use any medicines unless you ask your doctor if they are safe for you. °· Talk with your doctor before taking any herbal supplements. °· Make sure you gain enough weight. °· Watch for infection. If you think you might have an infection, get it checked right away. °· If you have gone into preterm labor before, tell your doctor. °This information is not intended to replace advice given to you by your health care provider. Make sure you discuss any questions you have with your health care provider. °Document Released: 10/14/2008 Document Revised: 12/29/2015 Document Reviewed: 12/09/2015 °Elsevier Interactive Patient Education © 2017 [employer] ° °

## 2016-11-16 ENCOUNTER — Ambulatory Visit (HOSPITAL_COMMUNITY): Admission: RE | Admit: 2016-11-16 | Payer: Medicaid Other | Source: Ambulatory Visit

## 2016-11-16 ENCOUNTER — Ambulatory Visit (INDEPENDENT_AMBULATORY_CARE_PROVIDER_SITE_OTHER): Payer: Medicaid Other | Admitting: Obstetrics and Gynecology

## 2016-11-16 VITALS — BP 118/70 | HR 113 | Wt 173.0 lb

## 2016-11-16 DIAGNOSIS — Z641 Problems related to multiparity: Secondary | ICD-10-CM | POA: Diagnosis not present

## 2016-11-16 DIAGNOSIS — O0993 Supervision of high risk pregnancy, unspecified, third trimester: Secondary | ICD-10-CM | POA: Diagnosis not present

## 2016-11-16 DIAGNOSIS — O09522 Supervision of elderly multigravida, second trimester: Secondary | ICD-10-CM

## 2016-11-16 DIAGNOSIS — Z8759 Personal history of other complications of pregnancy, childbirth and the puerperium: Secondary | ICD-10-CM | POA: Diagnosis not present

## 2016-11-16 DIAGNOSIS — Z98891 History of uterine scar from previous surgery: Secondary | ICD-10-CM

## 2016-11-16 NOTE — Progress Notes (Signed)
Subjective:  Tamara Bradford is a 41 y.o. [id number] at [redacted]w[redacted]d being seen today for ongoing prenatal care.  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  Patient reports no complaints.  Contractions: Not present. Vag. Bleeding: None.  Movement: Present. Denies leaking of fluid.   The following portions of the patient's history were reviewed and updated as appropriate: allergies, current medications, past family history, past medical history, past social history, past surgical history and problem list. Problem list updated.  Objective:   Vitals:   11/16/16 1415  BP: 118/70  Pulse: (!) 113  Weight: 173 lb (78.5 kg)    Fetal Status: Fetal Heart Rate (bpm): 142   Movement: Present     General:  Alert, oriented and cooperative. Patient is in no acute distress.  Skin: Skin is warm and dry. No rash noted.   Cardiovascular: Normal heart rate noted  Respiratory: Normal respiratory effort, no problems with respiration noted  Abdomen: Soft, gravid, appropriate for gestational age. Pain/Pressure: Absent     Pelvic:  Cervical exam deferred        Extremities: Normal range of motion.  Edema: None  Mental Status: Normal mood and affect. Normal behavior. Normal judgment and thought content.   Urinalysis:      Assessment and Plan:  Pregnancy: [id number] at [redacted]w[redacted]d  1. Supervision of high risk pregnancy in third  trimester Stable  2. Elderly multigravida in second trimester Panorama low risk Growth scan today Antenatal testing starting at next visit  3. 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 History of 3 cesarean sections   Preterm labor symptoms and general obstetric precautions including but not limited to vaginal bleeding, contractions, leaking of fluid and fetal movement were reviewed in detail with the patient. Please refer to After Visit Summary for other counseling recommendations.  Return in about 2 weeks (around 11/30/2016).   Hermina Staggers, MD

## 2016-11-24 ENCOUNTER — Other Ambulatory Visit: Payer: Self-pay | Admitting: Obstetrics and Gynecology

## 2016-11-24 ENCOUNTER — Encounter (HOSPITAL_COMMUNITY): Payer: Self-pay

## 2016-11-24 ENCOUNTER — Ambulatory Visit (HOSPITAL_COMMUNITY)
Admission: RE | Admit: 2016-11-24 | Discharge: 2016-11-24 | Disposition: A | Discharge status: Home / Self Care | Payer: Medicaid Other | Source: Ambulatory Visit | Attending: Obstetrics and Gynecology | Admitting: Obstetrics and Gynecology

## 2016-11-24 DIAGNOSIS — O34219 Maternal care for unspecified type scar from previous cesarean delivery: Secondary | ICD-10-CM

## 2016-11-24 DIAGNOSIS — O99320 Drug use complicating pregnancy, unspecified trimester: Secondary | ICD-10-CM

## 2016-11-24 DIAGNOSIS — F1910 Other psychoactive substance abuse, uncomplicated: Secondary | ICD-10-CM | POA: Insufficient documentation

## 2016-11-24 DIAGNOSIS — O34211 Maternal care for low transverse scar from previous cesarean delivery: Secondary | ICD-10-CM | POA: Insufficient documentation

## 2016-11-24 DIAGNOSIS — O09219 Supervision of pregnancy with history of pre-term labor, unspecified trimester: Secondary | ICD-10-CM

## 2016-11-24 DIAGNOSIS — O09293 Supervision of pregnancy with other poor reproductive or obstetric history, third trimester: Secondary | ICD-10-CM | POA: Diagnosis not present

## 2016-11-24 DIAGNOSIS — O09213 Supervision of pregnancy with history of pre-term labor, third trimester: Secondary | ICD-10-CM | POA: Insufficient documentation

## 2016-11-24 DIAGNOSIS — O09523 Supervision of elderly multigravida, third trimester: Secondary | ICD-10-CM | POA: Diagnosis not present

## 2016-11-24 DIAGNOSIS — O99323 Drug use complicating pregnancy, third trimester: Secondary | ICD-10-CM | POA: Diagnosis not present

## 2016-11-24 DIAGNOSIS — Z3A35 35 weeks gestation of pregnancy: Secondary | ICD-10-CM

## 2016-11-24 DIAGNOSIS — O99333 Smoking (tobacco) complicating pregnancy, third trimester: Secondary | ICD-10-CM | POA: Insufficient documentation

## 2016-11-28 ENCOUNTER — Other Ambulatory Visit: Payer: Self-pay | Admitting: Obstetrics and Gynecology

## 2016-11-29 ENCOUNTER — Encounter (HOSPITAL_COMMUNITY): Payer: Self-pay

## 2016-11-29 ENCOUNTER — Inpatient Hospital Stay (HOSPITAL_COMMUNITY)
Admission: AD | Admit: 2016-11-29 | Discharge: 2016-11-30 | Disposition: A | Discharge status: Home / Self Care | Payer: Medicaid Other | Source: Ambulatory Visit | Attending: Obstetrics and Gynecology | Admitting: Obstetrics and Gynecology

## 2016-11-29 DIAGNOSIS — O479 False labor, unspecified: Secondary | ICD-10-CM

## 2016-11-29 DIAGNOSIS — Z3A36 36 weeks gestation of pregnancy: Secondary | ICD-10-CM | POA: Insufficient documentation

## 2016-11-29 DIAGNOSIS — O4703 False labor before 37 completed weeks of gestation, third trimester: Secondary | ICD-10-CM | POA: Insufficient documentation

## 2016-11-29 NOTE — MAU Note (Signed)
Patient ctx 3-5 mins apart. Patient denies any bleeding or LOF Fetus active.

## 2016-11-30 DIAGNOSIS — O4703 False labor before 37 completed weeks of gestation, third trimester: Secondary | ICD-10-CM | POA: Diagnosis present

## 2016-11-30 DIAGNOSIS — Z3A36 36 weeks gestation of pregnancy: Secondary | ICD-10-CM | POA: Diagnosis not present

## 2016-11-30 NOTE — Discharge Instructions (Signed)
Braxton Hicks Contractions °Contractions of the uterus can occur throughout pregnancy, but they are not always a sign that you are in labor. You may have practice contractions called Braxton Hicks contractions. These false labor contractions are sometimes confused with true labor. °What are Braxton Hicks contractions? °Braxton Hicks contractions are tightening movements that occur in the muscles of the uterus before labor. Unlike true labor contractions, these contractions do not result in opening (dilation) and thinning of the cervix. Toward the end of pregnancy (32-34 weeks), Braxton Hicks contractions can happen more often and may become stronger. These contractions are sometimes difficult to tell apart from true labor because they can be very uncomfortable. You should not feel embarrassed if you go to the hospital with false labor. °Sometimes, the only way to tell if you are in true labor is for your health care provider to look for changes in the cervix. The health care provider will do a physical exam and may monitor your contractions. If you are not in true labor, the exam should show that your cervix is not dilating and your water has not broken. °If there are no prenatal problems or other health problems associated with your pregnancy, it is completely safe for you to be sent home with false labor. You may continue to have Braxton Hicks contractions until you go into true labor. °How can I tell the difference between true labor and false labor? °· Differences °¨ False labor °¨ Contractions last 30-70 seconds.: Contractions are usually shorter and not as strong as true labor contractions. °¨ Contractions become very regular.: Contractions are usually irregular. °¨ Discomfort is usually felt in the top of the uterus, and it spreads to the lower abdomen and low back.: Contractions are often felt in the front of the lower abdomen and in the groin. °¨ Contractions do not go away with walking.: Contractions may  go away when you walk around or change positions while lying down. °¨ Contractions usually become more intense and increase in frequency.: Contractions get weaker and are shorter-lasting as time goes on. °¨ The cervix dilates and gets thinner.: The cervix usually does not dilate or become thin. °Follow these instructions at home: °¨ Take over-the-counter and prescription medicines only as told by your health care provider. °¨ Keep up with your usual exercises and follow other instructions from your health care provider. °¨ Eat and drink lightly if you think you are going into labor. °¨ If Braxton Hicks contractions are making you uncomfortable: °¨ Change your position from lying down or resting to walking, or change from walking to resting. °¨ Sit and rest in a tub of warm water. °¨ Drink enough fluid to keep your urine clear or pale yellow. Dehydration may cause these contractions. °¨ Do slow and deep breathing several times an hour. °¨ Keep all follow-up prenatal visits as told by your health care provider. This is important. °Contact a health care provider if: °¨ You have a fever. °¨ You have continuous pain in your abdomen. °Get help right away if: °¨ Your contractions become stronger, more regular, and closer together. °¨ You have fluid leaking or gushing from your vagina. °¨ You pass blood-tinged mucus (bloody show). °¨ You have bleeding from your vagina. °¨ You have low back pain that you never had before. °¨ You feel your baby’s head pushing down and causing pelvic pressure. °¨ Your baby is not moving inside you as much as it used to. °Summary °¨ Contractions that occur before labor are   called Braxton Hicks contractions, false labor, or practice contractions. °¨ Braxton Hicks contractions are usually shorter, weaker, farther apart, and less regular than true labor contractions. True labor contractions usually become progressively stronger and regular and they become more frequent. °¨ Manage discomfort from  Braxton Hicks contractions by changing position, resting in a warm bath, drinking plenty of water, or practicing deep breathing. °This information is not intended to replace advice given to you by your health care provider. Make sure you discuss any questions you have with your health care provider. °Document Released: 07/18/2005 Document Revised: 06/06/2016 Document Reviewed: 06/06/2016 °Elsevier Interactive Patient Education © 2017 Else[employer] Inc[employer] °Fetal Movement Counts °Patient Name: ________________________________________________ Patient Due Date: ____________________ °What is a fetal movement count? °A fetal movement count is the number of times that you feel your baby move during a certain amount of time. This may also be called a fetal kick count. A fetal movement count is recommended for every pregnant woman. You may be asked to start counting fetal movements as early as week 28 of your pregnancy. °Pay attention to when your baby is most active. You may notice your baby's sleep and wake cycles. You may also notice things that make your baby move more. You should do a fetal movement count: °· When your baby is normally most active. °· At the same time each day. °A good time to count movements is while you are resting, after having something to eat and drink. °How do I count fetal movements? °1. Find a quiet, comfortable area. Sit, or lie down on your side. °2. Write down the date, the start time and stop time, and the number of movements that you felt between those two times. Take this information with you to your health care visits. °3. For 2 hours, count kicks, flutters, swishes, rolls, and jabs. You should feel at least 10 movements during 2 hours. °4. You may stop counting after you have felt 10 movements. °5. If you do not feel 10 movements in 2 hours, have something to eat and drink. Then, keep resting and counting for 1 hour. If you feel at least 4 movements during that hour, you may stop  counting. °Contact a health care provider if: °· You feel fewer than 4 movements in 2 hours. °· Your baby is not moving like he or she usually does. °Date: ____________ Start time: ____________ Stop time: ____________ Movements: ____________ °Date: ____________ Start time: ____________ Stop time: ____________ Movements: ____________ °Date: ____________ Start time: ____________ Stop time: ____________ Movements: ____________ °Date: ____________ Start time: ____________ Stop time: ____________ Movements: ____________ °Date: ____________ Start time: ____________ Stop time: ____________ Movements: ____________ °Date: ____________ Start time: ____________ Stop time: ____________ Movements: ____________ °Date: ____________ Start time: ____________ Stop time: ____________ Movements: ____________ °Date: ____________ Start time: ____________ Stop time: ____________ Movements: ____________ °Date: ____________ Start time: ____________ Stop time: ____________ Movements: ____________ °This information is not intended to replace advice given to you by your health care provider. Make sure you discuss any questions you have with your health care provider. °Document Released: 08/17/2006 Document Revised: 03/16/2016 Document Reviewed: 08/27/2015 °Elsevier Interactive Patient Education © 2017 Elsevier Inc. ° °

## 2016-12-01 ENCOUNTER — Ambulatory Visit (INDEPENDENT_AMBULATORY_CARE_PROVIDER_SITE_OTHER): Payer: Medicaid Other | Admitting: *Deleted

## 2016-12-01 VITALS — BP 119/64 | HR 104

## 2016-12-01 DIAGNOSIS — O09523 Supervision of elderly multigravida, third trimester: Secondary | ICD-10-CM

## 2016-12-01 MED ORDER — ALBUTEROL SULFATE HFA 108 (90 BASE) MCG/ACT IN AERS: 2.0000 | INHALATION_SPRAY | Freq: Four times a day (QID) | RESPIRATORY_TRACT | 2 refills | Status: DC | PRN

## 2016-12-01 NOTE — Progress Notes (Signed)
Labor precautions reviewed 

## 2016-12-01 NOTE — Progress Notes (Signed)
NST performed today was reviewed and was found to be reactive.  Continue recommended antenatal testing and prenatal care.  

## 2016-12-05 ENCOUNTER — Telehealth: Payer: Self-pay | Admitting: General Practice

## 2016-12-05 ENCOUNTER — Other Ambulatory Visit: Payer: Self-pay | Admitting: Obstetrics and Gynecology

## 2016-12-05 ENCOUNTER — Encounter: Payer: Self-pay | Admitting: Obstetrics and Gynecology

## 2016-12-05 ENCOUNTER — Encounter: Payer: Self-pay | Admitting: General Practice

## 2016-12-05 NOTE — Progress Notes (Signed)
Patient did not keep NST appointment for 12/05/2016.  Tamara Bradford, Jr MD Attending [facility](Faculty Practice)

## 2016-12-08 ENCOUNTER — Inpatient Hospital Stay (HOSPITAL_COMMUNITY)
Admission: AD | Admit: 2016-12-08 | Discharge: 2016-12-08 | Discharge status: Against Medical Advice | Payer: Medicaid Other | Source: Ambulatory Visit | Attending: Obstetrics & Gynecology | Admitting: Obstetrics & Gynecology

## 2016-12-08 ENCOUNTER — Ambulatory Visit: Payer: Self-pay

## 2016-12-08 ENCOUNTER — Ambulatory Visit (INDEPENDENT_AMBULATORY_CARE_PROVIDER_SITE_OTHER): Payer: Medicaid Other | Admitting: Family Medicine

## 2016-12-08 VITALS — BP 121/68 | HR 100

## 2016-12-08 DIAGNOSIS — O26893 Other specified pregnancy related conditions, third trimester: Secondary | ICD-10-CM | POA: Diagnosis not present

## 2016-12-08 DIAGNOSIS — Z641 Problems related to multiparity: Secondary | ICD-10-CM

## 2016-12-08 DIAGNOSIS — O0993 Supervision of high risk pregnancy, unspecified, third trimester: Secondary | ICD-10-CM

## 2016-12-08 DIAGNOSIS — O34219 Maternal care for unspecified type scar from previous cesarean delivery: Secondary | ICD-10-CM

## 2016-12-08 DIAGNOSIS — Z3A38 38 weeks gestation of pregnancy: Secondary | ICD-10-CM | POA: Diagnosis not present

## 2016-12-08 DIAGNOSIS — O09523 Supervision of elderly multigravida, third trimester: Secondary | ICD-10-CM | POA: Diagnosis not present

## 2016-12-08 DIAGNOSIS — J4521 Mild intermittent asthma with (acute) exacerbation: Secondary | ICD-10-CM | POA: Diagnosis not present

## 2016-12-08 DIAGNOSIS — J45901 Unspecified asthma with (acute) exacerbation: Secondary | ICD-10-CM | POA: Insufficient documentation

## 2016-12-08 DIAGNOSIS — O3429 Maternal care due to uterine scar from other previous surgery: Secondary | ICD-10-CM | POA: Diagnosis not present

## 2016-12-08 DIAGNOSIS — O471 False labor at or after 37 completed weeks of gestation: Secondary | ICD-10-CM | POA: Insufficient documentation

## 2016-12-08 MED ORDER — PREDNISONE 10 MG PO TABS: 40.0000 mg | ORAL_TABLET | Freq: Every day | ORAL | 0 refills | Status: DC

## 2016-12-08 MED ORDER — ALBUTEROL SULFATE HFA 108 (90 BASE) MCG/ACT IN AERS: 2.0000 | INHALATION_SPRAY | Freq: Four times a day (QID) | RESPIRATORY_TRACT | 2 refills | Status: DC | PRN

## 2016-12-08 NOTE — MAU Note (Signed)
Pt. Arrived via EMS for labor evaluation and asthma.  Pt. Pain 7/10 with contractions.  SVE completed Thick/1/-3. No bloody show noted. Abd. Palpated soft. No true evidence of labor. Pulse ox applied, sats 91% increasing to 100% within seconds.  EFM applied - FHR  140s, Toco applied - abd. Soft.

## 2016-12-08 NOTE — Progress Notes (Signed)
   PRENATAL VISIT NOTE  Subjective:  Tamara Bradford is a 41 y.o. [id number] at 4958w1d being seen today for ongoing prenatal care.  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  Patient reports significant SOB, wheezing..   .  .  Movement: Present. Denies leaking of fluid.   The following portions of the patient's history were reviewed and updated as appropriate: allergies, current medications, past family history, past medical history, past social history, past surgical history and problem list. Problem list updated.  Objective:   Vitals:   12/08/16 1612  BP: 121/68  Pulse: 100    Fetal Status: Fetal Heart Rate (bpm): NST   Movement: Present     General:  Alert, oriented and cooperative. Patient is in no acute distress.  Skin: Skin is warm and dry. No rash noted.   Cardiovascular: Normal heart rate noted  Respiratory: tachypneic, poor air movement, diffuse wheezing inspiratory and expiratory  Abdomen: Soft, gravid, appropriate for gestational age.       Pelvic:  Cervical exam deferred        Extremities: Normal range of motion.     Mental Status: Normal mood and affect. Normal behavior. Normal judgment and thought content.  NST:  Baseline: 135 bpm, Variability: Good {> 6 bpm), Accelerations: Reactive and Decelerations: Absent   Assessment and Plan:  Pregnancy: [id number] at 9658w1d  1. AMA (advanced  maternal age) multigravida 35+, third trimester - Fetal nonstress test - US OB Limited  2. Supervision of high risk pregnancy in third trimester Continue prenatal care.  3. 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 Uterine scar from previous cesarean delivery affecting pregnancy Previous C-section x 3--scheduled for RCS at 39 wks, [place]  5. Mild intermittent asthma with acute exacerbation Prednisone 40 mg x 5 days--if no significant improvement in next 24 hours, return for further treatment. Refilled Albuterol Continue Pulmicort Stop smoking  Term labor symptoms and general obstetric precautions including but not limited to vaginal bleeding, contractions, leaking of fluid and fetal movement were reviewed in detail with the patient. Please refer to After Visit Summary for other counseling recommendations.  Return in about 4 days (around 12/12/2016) for NST;  5/17 or 5/18 Ob fu and NST/AFI.   Reva BoresPratt, Lanecia Sliva S, MD

## 2016-12-08 NOTE — MAU Note (Signed)
Upon leaving the room of another patient, informed by charge nurse that patient left AMA.  Provider Rayfield Citizen(Caroline, CNM) notified.

## 2016-12-08 NOTE — Progress Notes (Signed)
Pt asked for her nurse, I went in and let her know her nurse was in another room and could I help her. Pt was sitting up on side of bed, pulse ox was off and pulse ox and sheet was laying on floor. Pt said she wants to be seen for her breathing problem.  I asked her if the breathing treatment on the EMS ride here helped and she said not then she said,  'if I'm not in labor, I want to go home, bring me the papers to sign out'. I told her to let me notify your provider.  I stepped out to nsg desk and notified [place] Smith CNM regarding labor check and breathing issue.  Within 5-8 min, pt came out of room dressed.  She spoke to [place] CNM briefly at nursing station, then [facility] and [facility], has a Retail bankerpulmologist.

## 2016-12-08 NOTE — Patient Instructions (Signed)
Asthma, Acute Bronchospasm °Acute bronchospasm caused by asthma is also referred to as an asthma attack. Bronchospasm means your air passages become narrowed. The narrowing is caused by inflammation and tightening of the muscles in the air tubes (bronchi) in your lungs. This can make it hard to breathe or cause you to wheeze and cough. °What are the causes? °Possible triggers are: °· Animal dander from the skin, hair, or feathers of animals. °· Dust mites contained in house dust. °· Cockroaches. °· Pollen from trees or grass. °· Mold. °· Cigarette or tobacco smoke. °· Air pollutants such as dust, household cleaners, hair sprays, aerosol sprays, paint fumes, strong chemicals, or strong odors. °· Cold air or weather changes. Cold air may trigger inflammation. Winds increase molds and pollens in the air. °· Strong emotions such as crying or laughing hard. °· Stress. °· Certain medicines such as aspirin or beta-blockers. °· Sulfites in foods and drinks, such as dried fruits and wine. °· Infections or inflammatory conditions, such as a flu, cold, or inflammation of the nasal membranes (rhinitis). °· Gastroesophageal reflux disease (GERD). GERD is a condition where stomach acid backs up into your esophagus. °· Exercise or strenuous activity. ° °What are the signs or symptoms? °· Wheezing. °· Excessive coughing, particularly at night. °· Chest tightness. °· Shortness of breath. °How is this diagnosed? °Your health care provider will ask you about your medical history and perform a physical exam. A chest X-ray or blood testing may be performed to look for other causes of your symptoms or other conditions that may have triggered your asthma attack. °How is this treated? °Treatment is aimed at reducing inflammation and opening up the airways in your lungs. Most asthma attacks are treated with inhaled medicines. These include quick relief or rescue medicines (such as bronchodilators) and controller medicines (such as inhaled  corticosteroids). These medicines are sometimes given through an inhaler or a nebulizer. Systemic steroid medicine taken by mouth or given through an IV tube also can be used to reduce the inflammation when an attack is moderate or severe. Antibiotic medicines are only used if a bacterial infection is present. °Follow these instructions at home: °· Rest. °· Drink plenty of liquids. This helps the mucus to remain thin and be easily coughed up. Only use caffeine in moderation and do not use alcohol until you have recovered from your illness. °· Do not smoke. Avoid being exposed to secondhand smoke. °· You play a critical role in keeping yourself in good health. Avoid exposure to things that cause you to wheeze or to have breathing problems. °· Keep your medicines up-to-date and available. Carefully follow your health care provider’s treatment plan. °· Take your medicine exactly as prescribed. °· When pollen or pollution is bad, keep windows closed and use an air conditioner or go to places with air conditioning. °· Asthma requires careful medical care. See your health care provider for a follow-up as advised. If you are more than [redacted] weeks pregnant and you were prescribed any new medicines, let your obstetrician know about the visit and how you are doing. Follow up with your health care provider as directed. °· After you have recovered from your asthma attack, make an appointment with your outpatient doctor to talk about ways to reduce the likelihood of future attacks. If you do not have a doctor who manages your asthma, make an appointment with a primary care doctor to discuss your asthma. °Get help right away if: °· You are getting worse. °·   You have trouble breathing. If severe, call your local emergency services (911 in the [place] °· You develop chest pain or discomfort. °· You are vomiting. °· You are not able to keep fluids down. °· You are coughing up yellow, green, brown, or bloody sputum. °· You have a fever  and your symptoms suddenly get worse. °· You have trouble swallowing. °This information is not intended to replace advice given to you by your health care provider. Make sure you discuss any questions you have with your health care provider. °Document Released: 11/02/2006 Document Revised: 12/30/2015 Document Reviewed: 01/23/2013 °Elsevier Interactive Patient Education © 2017 Elsevier Inc. ° °

## 2016-12-08 NOTE — MAU Provider Note (Signed)
S: Pt came by EMS for labor check. Incidentally noted to have increased work of breathing. Given breathing Tx w/ partial relief by EMS.   O: Patient Vitals for the past 24 hrs:  BP Temp Temp src Pulse Resp SpO2 Height Weight  12/08/16 0550 - - - 86 - 98 % - -  12/08/16 0545 - - - 63 - 99 % - -  12/08/16 0540 - - - 79 - 99 % - -  12/08/16 0535 - - - 66 - 99 % - -  12/08/16 0529 116/66 98.2 F (36.8 C) Oral 80 (!) 21 91 % 5\' 7"  (1.702 m) 160 lb (72.6 kg)    Dilation: 1 Effacement (%): Thick Cervical Position: Posterior Exam by:: Millner, RN    FHR 140, mod variability. 10x10 accels, one 15x15, no decels Toco: UI  A: False labor Asthma exacerbation. Sats 98-99% on RA.  Pt removed monitors, asking to leave AMA after 20 minutes since she wasn't in labor. Did not permit respiratory eval. Has Pulmonologist.  FHR tracing reassuring, borderline reactive.   Katrinka BlazingSmith, [place], CNM 12/08/2016 6:59 AM

## 2016-12-08 NOTE — Progress Notes (Addendum)
Pt informed that the ultrasound is considered a limited OB ultrasound and is not intended to be a complete ultrasound exam.  Patient also informed that the ultrasound is not being completed with the intent of assessing for fetal or placental anomalies or any pelvic abnormalities.  Explained that the purpose of today's ultrasound is to assess for presentation and amniotic fluid volume.  Patient acknowledges the purpose of the exam and the limitations of the study.    Upon arrival to exam room @ 1600 pt reported having severe difficulty breathing.  She stated that she went to MAU earlier today and then left "because they weren't paying attention to my problem that I couldn't breathe. All they wanted to do was check to see if I was in labor".  Pt was visibly shaken, SOB and crying. I asked Dr. Shawnie PonsPratt to see pt and she agreed.  After completion of scheduled fetal testing and exam by Dr. Shawnie PonsPratt, pt was calmer. She voiced appreciation for her complaints being heard and for evaluation of her difficulty breathing. She agreed to plan of care as set forth by Dr. Shawnie PonsPratt.

## 2016-12-09 ENCOUNTER — Encounter (HOSPITAL_COMMUNITY): Payer: Self-pay

## 2016-12-12 ENCOUNTER — Ambulatory Visit (INDEPENDENT_AMBULATORY_CARE_PROVIDER_SITE_OTHER): Payer: Medicaid Other | Admitting: *Deleted

## 2016-12-12 ENCOUNTER — Other Ambulatory Visit: Payer: Self-pay | Admitting: Obstetrics and Gynecology

## 2016-12-12 ENCOUNTER — Ambulatory Visit (HOSPITAL_COMMUNITY)
Admission: RE | Admit: 2016-12-12 | Discharge: 2016-12-12 | Disposition: A | Discharge status: Home / Self Care | Payer: Medicaid Other | Source: Ambulatory Visit | Attending: Obstetrics and Gynecology | Admitting: Obstetrics and Gynecology

## 2016-12-12 VITALS — BP 132/70 | HR 95

## 2016-12-12 DIAGNOSIS — O34219 Maternal care for unspecified type scar from previous cesarean delivery: Secondary | ICD-10-CM | POA: Diagnosis not present

## 2016-12-12 DIAGNOSIS — O288 Other abnormal findings on antenatal screening of mother: Secondary | ICD-10-CM

## 2016-12-12 DIAGNOSIS — F1910 Other psychoactive substance abuse, uncomplicated: Secondary | ICD-10-CM | POA: Diagnosis not present

## 2016-12-12 DIAGNOSIS — O09293 Supervision of pregnancy with other poor reproductive or obstetric history, third trimester: Secondary | ICD-10-CM | POA: Diagnosis not present

## 2016-12-12 DIAGNOSIS — O09523 Supervision of elderly multigravida, third trimester: Secondary | ICD-10-CM

## 2016-12-12 DIAGNOSIS — Z3A37 37 weeks gestation of pregnancy: Secondary | ICD-10-CM

## 2016-12-12 DIAGNOSIS — O99333 Smoking (tobacco) complicating pregnancy, third trimester: Secondary | ICD-10-CM | POA: Diagnosis not present

## 2016-12-12 DIAGNOSIS — O99323 Drug use complicating pregnancy, third trimester: Secondary | ICD-10-CM | POA: Insufficient documentation

## 2016-12-12 DIAGNOSIS — O09213 Supervision of pregnancy with history of pre-term labor, third trimester: Secondary | ICD-10-CM | POA: Insufficient documentation

## 2016-12-12 NOTE — Progress Notes (Signed)
Pt is much improved as compared to last visit on 5/10. She is breathing without difficulty and states she feels good. NST tracing reviewed by Dr. Erin FullingHarraway-Smith due to Dr. Alysia PennaErvin in surgery.  Pt taken to MFM dept for BPP due to NR NST today. Next scheduled appt on 5/17.

## 2016-12-14 ENCOUNTER — Telehealth: Payer: Self-pay | Admitting: *Deleted

## 2016-12-14 ENCOUNTER — Inpatient Hospital Stay (HOSPITAL_COMMUNITY)
Admission: AD | Admit: 2016-12-14 | Discharge: 2016-12-14 | Disposition: A | Discharge status: Home / Self Care | Payer: Medicaid Other | Source: Ambulatory Visit | Attending: Obstetrics and Gynecology | Admitting: Obstetrics and Gynecology

## 2016-12-14 ENCOUNTER — Encounter (HOSPITAL_COMMUNITY): Payer: Self-pay | Admitting: *Deleted

## 2016-12-14 DIAGNOSIS — O471 False labor at or after 37 completed weeks of gestation: Secondary | ICD-10-CM | POA: Diagnosis not present

## 2016-12-14 DIAGNOSIS — Z3A38 38 weeks gestation of pregnancy: Secondary | ICD-10-CM | POA: Insufficient documentation

## 2016-12-14 NOTE — Telephone Encounter (Signed)
Patient left message on the nurse voicemail on 12/14/16 at 1014.  States she is [redacted] weeks pregnant and is having lower abdominal pain and the baby isn't moving as much as she normally does.  Requests a return call.

## 2016-12-14 NOTE — Progress Notes (Signed)
Dr Sydnee Cabaliallo notified of pts contractions, FHR pattern, Ve, orders received to discharge home

## 2016-12-14 NOTE — MAU Note (Signed)
Urine in lab 

## 2016-12-14 NOTE — MAU Note (Signed)
Pains started at 0800- coming about every 7 min, no bleeding or leaking.

## 2016-12-14 NOTE — Progress Notes (Signed)
I have communicated with Dr Sydnee Cabaliallo and reviewed vital signs:  Vitals:   12/14/16 1208 12/14/16 1210  BP: (!) 156/84 139/87  Pulse: 90   Resp: 20   Temp: 98.2 F (36.8 C)     Vaginal exam:  Dilation: 1 Effacement (%): Thick Cervical Position: Posterior Station: -3 Exam by:: Ambrosio Reuter Rn,   Also reviewed contraction pattern and that non-stress test is reactive.  It has been documented that patient is contracting every 2-8 minutes with no cervical change over 1.5 hours not indicating active labor.  Patient denies any other complaints.  Based on this report provider has given order for discharge.  A discharge order and diagnosis entered by a provider.   Labor discharge instructions reviewed with patient.

## 2016-12-15 ENCOUNTER — Ambulatory Visit: Payer: Self-pay

## 2016-12-15 ENCOUNTER — Ambulatory Visit (INDEPENDENT_AMBULATORY_CARE_PROVIDER_SITE_OTHER): Payer: Medicaid Other | Admitting: Family Medicine

## 2016-12-15 VITALS — BP 128/76 | HR 102 | Wt 180.6 lb

## 2016-12-15 DIAGNOSIS — O0993 Supervision of high risk pregnancy, unspecified, third trimester: Secondary | ICD-10-CM | POA: Diagnosis not present

## 2016-12-15 DIAGNOSIS — O09523 Supervision of elderly multigravida, third trimester: Secondary | ICD-10-CM | POA: Diagnosis not present

## 2016-12-15 LAB — POCT URINALYSIS DIP (DEVICE)
Bilirubin Urine: NEGATIVE
GLUCOSE, UA: NEGATIVE mg/dL
Ketones, ur: NEGATIVE mg/dL
LEUKOCYTES UA: NEGATIVE
NITRITE: NEGATIVE
PROTEIN: NEGATIVE mg/dL
SPECIFIC GRAVITY, URINE: 1.02 (ref 1.005–1.030)
UROBILINOGEN UA: 0.2 mg/dL (ref 0.0–1.0)
pH: 6.5 (ref 5.0–8.0)

## 2016-12-15 NOTE — Progress Notes (Addendum)
Pt seen @ MAU yesterday for c/o UC's - NST was reactive and cervix was unfavorable with no change in 1.5 hrs. She reports still having UC's about the same as yesterday and desires cervical exam.   Pt informed that the ultrasound is considered a limited OB ultrasound and is not intended to be a complete ultrasound exam.  Patient also informed that the ultrasound is not being completed with the intent of assessing for fetal or placental anomalies or any pelvic abnormalities.  Explained that the purpose of today's ultrasound is to assess for presentation and amniotic fluid volume.  Patient acknowledges the purpose of the exam and the limitations of the study.

## 2016-12-15 NOTE — Telephone Encounter (Signed)
Per chart review, pt went to MAU for evaluation on 5/16 @ 1204.  She had reactive NST and was having regular UC's however no cervical change in 1.5 hrs and was discharged. Pt has Ob fu appt in office today as scheduled.

## 2016-12-15 NOTE — Progress Notes (Signed)
   PRENATAL VISIT NOTE  Subjective:  Tamara Bradford is a 41 y.o. [id number] at 4626w1d being seen today for ongoing prenatal care.  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  Patient reports occasional contractions.   . Vag. Bleeding: None, Scant.  Movement: Present. Denies leaking of fluid.   The following portions of the patient's history were reviewed and updated as appropriate: allergies, current medications, past family history, past medical history, past social history, past surgical history and problem list. Problem list updated.  Objective:   Vitals:   12/15/16 1318  BP: 128/76  Pulse: (!) 102  Weight: 180 lb 9.6 oz (81.9 kg)    Fetal Status: Fetal Heart Rate (bpm): 154   Movement: Present  Presentation: Vertex  General:  Alert, oriented and cooperative. Patient is in no acute distress.  Skin: Skin is warm and dry. No rash noted.   Cardiovascular: Normal heart rate noted  Respiratory: Normal respiratory effort, no problems with respiration noted  Abdomen: Soft, gravid, appropriate for gestational age. Pain/Pressure: Present     Pelvic:  Cervical exam deferred Dilation: 1 Effacement (%): Thick Station: Ballotable  Extremities: Normal range of motion.  Edema: Trace  Mental Status: Normal mood and affect.  Normal behavior. Normal judgment and thought content.   Assessment and Plan:  Pregnancy: [id number] at 5626w1d  1. AMA (advanced maternal age) multigravida 40+, third trimester Continue NST - US OB Limited  2. Supervision of high risk pregnancy in third trimester FHT normal.  Term labor symptoms and general obstetric precautions including but not limited to vaginal bleeding, contractions, leaking of fluid and fetal movement were reviewed in detail with the patient. Please refer to After Visit Summary for other counseling recommendations.  Return in about 4 days (around 12/19/2016) for as scheduled.   Levie HeritageJacob J Stinson, DO

## 2016-12-16 ENCOUNTER — Encounter (HOSPITAL_COMMUNITY): Payer: Self-pay

## 2016-12-19 ENCOUNTER — Ambulatory Visit (INDEPENDENT_AMBULATORY_CARE_PROVIDER_SITE_OTHER): Payer: Medicaid Other | Admitting: Obstetrics & Gynecology

## 2016-12-19 VITALS — BP 131/72 | HR 104

## 2016-12-19 DIAGNOSIS — O09523 Supervision of elderly multigravida, third trimester: Secondary | ICD-10-CM | POA: Diagnosis not present

## 2016-12-19 NOTE — Progress Notes (Signed)
Pt reports increased urgency of urination, UC's and pain @ incision. Repeat C/S scheduled 5/24.

## 2016-12-19 NOTE — Progress Notes (Signed)
NST reviewed and reactive.  Shamina Etheridge L. Harraway-Smith, M.D., FACOG    

## 2016-12-20 ENCOUNTER — Encounter (HOSPITAL_COMMUNITY)
Admission: RE | Admit: 2016-12-20 | Discharge: 2016-12-20 | Disposition: A | Discharge status: Home / Self Care | Payer: Medicaid Other | Source: Ambulatory Visit | Attending: Family Medicine | Admitting: Family Medicine

## 2016-12-20 LAB — CBC
HEMATOCRIT: 31.4 % — AB (ref 36.0–46.0)
HEMOGLOBIN: 10.3 g/dL — AB (ref 12.0–15.0)
MCH: 29.7 pg (ref 26.0–34.0)
MCHC: 32.8 g/dL (ref 30.0–36.0)
MCV: 90.5 fL (ref 78.0–100.0)
Platelets: 263 10*3/uL (ref 150–400)
RBC: 3.47 MIL/uL — AB (ref 3.87–5.11)
RDW: 15.9 % — ABNORMAL HIGH (ref 11.5–15.5)
WBC: 15.7 10*3/uL — ABNORMAL HIGH (ref 4.0–10.5)

## 2016-12-20 MED ORDER — GENTAMICIN SULFATE 40 MG/ML IJ SOLN
INTRAMUSCULAR | Status: AC
  Administered 2016-12-21: 100 mL via INTRAVENOUS
  Filled 2016-12-20: qty 8.75

## 2016-12-20 NOTE — Patient Instructions (Signed)
[name]  12/20/2016   Your procedure is scheduled on:  12/21/2016  Enter through the [facility]tr[facility] of [facility] at 1145 AM.  Pick up the phone at the desk and dial [phone].   Call this number if you have problems the morning of surgery: [phone]   Remember:   Do not eat food:After Midnight.  Do not drink clear liquids: After Midnight.  Take these medicines the morning of surgery with A SIP OF WATER: none   Do not wear jewelry, make-up or nail polish.  Do not wear lotions, powders, or perfumes. Do not wear deodorant.  Do not shave 48 hours prior to surgery.  Do not bring valuables to the hospital.  [facility] is not   responsible for any belongings or valuables brought to the hospital.  Contacts, dentures or bridgework may not be worn into surgery.  Leave suitcase in the car. After surgery it may be brought to your room.  For patients admitted to the hospital, checkout time is 11:00 AM the day of              discharge.   Patients discharged the day of surgery will not be allowed to drive             home.  Name and phone number of your driver: na  Special Instructions:   N/A   Please read over the following fact sheets that you were given:   Surgical Site Infection Prevention

## 2016-12-21 ENCOUNTER — Encounter (HOSPITAL_COMMUNITY): Payer: Self-pay | Admitting: *Deleted

## 2016-12-21 ENCOUNTER — Inpatient Hospital Stay (HOSPITAL_COMMUNITY): Payer: Medicaid Other | Admitting: Anesthesiology

## 2016-12-21 ENCOUNTER — Encounter (HOSPITAL_COMMUNITY)
Admission: RE | Disposition: A | Discharge status: Home / Self Care | Payer: Self-pay | Source: Ambulatory Visit | Attending: Family Medicine

## 2016-12-21 ENCOUNTER — Inpatient Hospital Stay (HOSPITAL_COMMUNITY)
Admission: RE | Admit: 2016-12-21 | Discharge: 2016-12-23 | DRG: 766 | Disposition: A | Discharge status: Home / Self Care | Payer: Medicaid Other | Source: Ambulatory Visit | Attending: Family Medicine | Admitting: Family Medicine

## 2016-12-21 DIAGNOSIS — Z7982 Long term (current) use of aspirin: Secondary | ICD-10-CM | POA: Diagnosis not present

## 2016-12-21 DIAGNOSIS — F17210 Nicotine dependence, cigarettes, uncomplicated: Secondary | ICD-10-CM | POA: Diagnosis present

## 2016-12-21 DIAGNOSIS — O34211 Maternal care for low transverse scar from previous cesarean delivery: Principal | ICD-10-CM | POA: Diagnosis present

## 2016-12-21 DIAGNOSIS — O99334 Smoking (tobacco) complicating childbirth: Secondary | ICD-10-CM | POA: Diagnosis present

## 2016-12-21 DIAGNOSIS — O0990 Supervision of high risk pregnancy, unspecified, unspecified trimester: Secondary | ICD-10-CM

## 2016-12-21 DIAGNOSIS — Z98891 History of uterine scar from previous surgery: Secondary | ICD-10-CM

## 2016-12-21 DIAGNOSIS — Z3A39 39 weeks gestation of pregnancy: Secondary | ICD-10-CM

## 2016-12-21 DIAGNOSIS — Z641 Problems related to multiparity: Secondary | ICD-10-CM

## 2016-12-21 DIAGNOSIS — O34219 Maternal care for unspecified type scar from previous cesarean delivery: Secondary | ICD-10-CM | POA: Diagnosis present

## 2016-12-21 LAB — PREPARE RBC (CROSSMATCH)

## 2016-12-21 LAB — RPR: RPR: NONREACTIVE

## 2016-12-21 SURGERY — Surgical Case
Anesthesia: Choice

## 2016-12-21 MED ORDER — SIMETHICONE 80 MG PO CHEW
80.0000 mg | CHEWABLE_TABLET | Freq: Three times a day (TID) | ORAL | Status: DC
  Administered 2016-12-21 – 2016-12-23 (×5): 80 mg via ORAL
  Filled 2016-12-21 (×5): qty 1

## 2016-12-21 MED ORDER — NALBUPHINE HCL 10 MG/ML IJ SOLN: 5.0000 mg | INTRAMUSCULAR | Status: DC | PRN

## 2016-12-21 MED ORDER — PRENATAL MULTIVITAMIN CH
1.0000 | ORAL_TABLET | Freq: Every day | ORAL | Status: DC
  Administered 2016-12-22: 1 via ORAL
  Filled 2016-12-21 (×2): qty 1

## 2016-12-21 MED ORDER — METHYLERGONOVINE MALEATE 0.2 MG PO TABS
0.2000 mg | ORAL_TABLET | Freq: Three times a day (TID) | ORAL | Status: AC
  Administered 2016-12-21 – 2016-12-22 (×3): 0.2 mg via ORAL
  Filled 2016-12-21 (×3): qty 1

## 2016-12-21 MED ORDER — LACTATED RINGERS IV SOLN
INTRAVENOUS | Status: DC
  Administered 2016-12-21 (×3): via INTRAVENOUS

## 2016-12-21 MED ORDER — FENTANYL CITRATE (PF) 100 MCG/2ML IJ SOLN
INTRAMUSCULAR | Status: AC
  Filled 2016-12-21: qty 2

## 2016-12-21 MED ORDER — DIBUCAINE 1 % RE OINT: 1.0000 "application " | TOPICAL_OINTMENT | RECTAL | Status: DC | PRN

## 2016-12-21 MED ORDER — SENNOSIDES-DOCUSATE SODIUM 8.6-50 MG PO TABS
2.0000 | ORAL_TABLET | ORAL | Status: DC
  Administered 2016-12-21 – 2016-12-22 (×2): 2 via ORAL
  Filled 2016-12-21 (×2): qty 2

## 2016-12-21 MED ORDER — OXYTOCIN 10 UNIT/ML IJ SOLN
INTRAMUSCULAR | Status: AC
  Filled 2016-12-21: qty 4

## 2016-12-21 MED ORDER — LACTATED RINGERS IV SOLN
INTRAVENOUS | Status: DC
  Administered 2016-12-21: 21:00:00 via INTRAVENOUS

## 2016-12-21 MED ORDER — OXYCODONE HCL 5 MG PO TABS
5.0000 mg | ORAL_TABLET | ORAL | Status: DC | PRN
  Administered 2016-12-22 – 2016-12-23 (×4): 5 mg via ORAL
  Filled 2016-12-21 (×4): qty 1

## 2016-12-21 MED ORDER — ONDANSETRON HCL 4 MG/2ML IJ SOLN
INTRAMUSCULAR | Status: AC
  Filled 2016-12-21: qty 2

## 2016-12-21 MED ORDER — SODIUM CHLORIDE 0.9 % IJ SOLN
INTRAMUSCULAR | Status: AC
  Filled 2016-12-21: qty 20

## 2016-12-21 MED ORDER — SIMETHICONE 80 MG PO CHEW: 80.0000 mg | CHEWABLE_TABLET | ORAL | Status: DC | PRN

## 2016-12-21 MED ORDER — BUPIVACAINE HCL (PF) 0.25 % IJ SOLN
INTRAMUSCULAR | Status: AC
  Filled 2016-12-21: qty 10

## 2016-12-21 MED ORDER — PHENYLEPHRINE 8 MG IN D5W 100 ML (0.08MG/ML) PREMIX OPTIME
INJECTION | INTRAVENOUS | Status: AC
  Filled 2016-12-21: qty 100

## 2016-12-21 MED ORDER — DIPHENHYDRAMINE HCL 50 MG/ML IJ SOLN: 12.5000 mg | INTRAMUSCULAR | Status: DC | PRN

## 2016-12-21 MED ORDER — ONDANSETRON HCL 4 MG/2ML IJ SOLN
4.0000 mg | Freq: Three times a day (TID) | INTRAMUSCULAR | Status: DC | PRN
Start: 2016-12-21 — End: 2016-12-23

## 2016-12-21 MED ORDER — WITCH HAZEL-GLYCERIN EX PADS: 1.0000 "application " | MEDICATED_PAD | CUTANEOUS | Status: DC | PRN

## 2016-12-21 MED ORDER — NALOXONE HCL 0.4 MG/ML IJ SOLN: 0.4000 mg | INTRAMUSCULAR | Status: DC | PRN

## 2016-12-21 MED ORDER — SODIUM CHLORIDE 0.9% FLUSH: 3.0000 mL | INTRAVENOUS | Status: DC | PRN

## 2016-12-21 MED ORDER — NALBUPHINE HCL 10 MG/ML IJ SOLN: 5.0000 mg | Freq: Once | INTRAMUSCULAR | Status: DC | PRN

## 2016-12-21 MED ORDER — LACTATED RINGERS IV SOLN: INTRAVENOUS | Status: DC

## 2016-12-21 MED ORDER — MORPHINE SULFATE (PF) 0.5 MG/ML IJ SOLN
INTRAMUSCULAR | Status: AC
  Filled 2016-12-21: qty 10

## 2016-12-21 MED ORDER — BUPIVACAINE IN DEXTROSE 0.75-8.25 % IT SOLN
INTRATHECAL | Status: AC
  Filled 2016-12-21: qty 2

## 2016-12-21 MED ORDER — SODIUM CHLORIDE 0.9 % IR SOLN
Status: DC | PRN
Start: 2016-12-21 — End: 2016-12-21
  Administered 2016-12-21: 200 mL

## 2016-12-21 MED ORDER — FENTANYL CITRATE (PF) 100 MCG/2ML IJ SOLN: 25.0000 ug | INTRAMUSCULAR | Status: DC | PRN

## 2016-12-21 MED ORDER — MORPHINE SULFATE (PF) 0.5 MG/ML IJ SOLN
INTRAMUSCULAR | Status: DC | PRN
  Administered 2016-12-21: .2 mg via INTRATHECAL

## 2016-12-21 MED ORDER — BUPIVACAINE HCL 0.25 % IJ SOLN
INTRAMUSCULAR | Status: DC | PRN
  Administered 2016-12-21: 10 mL

## 2016-12-21 MED ORDER — ONDANSETRON HCL 4 MG/2ML IJ SOLN
INTRAMUSCULAR | Status: DC | PRN
  Administered 2016-12-21: 4 mg via INTRAVENOUS

## 2016-12-21 MED ORDER — BUPIVACAINE IN DEXTROSE 0.75-8.25 % IT SOLN
INTRATHECAL | Status: DC | PRN
  Administered 2016-12-21: 1.4 mL via INTRATHECAL

## 2016-12-21 MED ORDER — SOD CITRATE-CITRIC ACID 500-334 MG/5ML PO SOLN
30.0000 mL | Freq: Once | ORAL | Status: AC
  Administered 2016-12-21: 30 mL via ORAL
  Filled 2016-12-21: qty 15

## 2016-12-21 MED ORDER — METOCLOPRAMIDE HCL 5 MG/ML IJ SOLN: 10.0000 mg | Freq: Once | INTRAMUSCULAR | Status: DC | PRN

## 2016-12-21 MED ORDER — IBUPROFEN 600 MG PO TABS
600.0000 mg | ORAL_TABLET | Freq: Four times a day (QID) | ORAL | Status: DC | PRN
  Administered 2016-12-22 – 2016-12-23 (×5): 600 mg via ORAL
  Filled 2016-12-21 (×5): qty 1

## 2016-12-21 MED ORDER — OXYCODONE HCL 5 MG PO TABS: 10.0000 mg | ORAL_TABLET | ORAL | Status: DC | PRN

## 2016-12-21 MED ORDER — PNEUMOCOCCAL VAC POLYVALENT 25 MCG/0.5ML IJ INJ
0.5000 mL | INJECTION | INTRAMUSCULAR | Status: AC
  Administered 2016-12-23: 0.5 mL via INTRAMUSCULAR
  Filled 2016-12-21: qty 0.5

## 2016-12-21 MED ORDER — TETANUS-DIPHTH-ACELL PERTUSSIS 5-2.5-18.5 LF-MCG/0.5 IM SUSP: 0.5000 mL | Freq: Once | INTRAMUSCULAR | Status: DC

## 2016-12-21 MED ORDER — SIMETHICONE 80 MG PO CHEW
80.0000 mg | CHEWABLE_TABLET | ORAL | Status: DC
  Administered 2016-12-21 – 2016-12-22 (×2): 80 mg via ORAL
  Filled 2016-12-21 (×2): qty 1

## 2016-12-21 MED ORDER — DIPHENHYDRAMINE HCL 25 MG PO CAPS
25.0000 mg | ORAL_CAPSULE | ORAL | Status: DC | PRN
  Filled 2016-12-21: qty 1

## 2016-12-21 MED ORDER — PREDNISONE 20 MG PO TABS
40.0000 mg | ORAL_TABLET | Freq: Every day | ORAL | Status: AC
  Administered 2016-12-22 – 2016-12-23 (×2): 40 mg via ORAL
  Filled 2016-12-21 (×2): qty 2

## 2016-12-21 MED ORDER — DIPHENHYDRAMINE HCL 25 MG PO CAPS: 25.0000 mg | ORAL_CAPSULE | Freq: Four times a day (QID) | ORAL | Status: DC | PRN

## 2016-12-21 MED ORDER — ZOLPIDEM TARTRATE 5 MG PO TABS: 5.0000 mg | ORAL_TABLET | Freq: Every evening | ORAL | Status: DC | PRN

## 2016-12-21 MED ORDER — COCONUT OIL OIL: 1.0000 "application " | TOPICAL_OIL | Status: DC | PRN

## 2016-12-21 MED ORDER — MEPERIDINE HCL 25 MG/ML IJ SOLN: 6.2500 mg | INTRAMUSCULAR | Status: DC | PRN

## 2016-12-21 MED ORDER — OXYTOCIN 10 UNIT/ML IJ SOLN
INTRAVENOUS | Status: DC | PRN
  Administered 2016-12-21: 40 [IU] via INTRAVENOUS

## 2016-12-21 MED ORDER — ALBUTEROL SULFATE (2.5 MG/3ML) 0.083% IN NEBU
3.0000 mL | INHALATION_SOLUTION | Freq: Four times a day (QID) | RESPIRATORY_TRACT | Status: DC
  Administered 2016-12-21 – 2016-12-22 (×4): 3 mL via RESPIRATORY_TRACT
  Filled 2016-12-21 (×4): qty 3

## 2016-12-21 MED ORDER — MENTHOL 3 MG MT LOZG: 1.0000 | LOZENGE | OROMUCOSAL | Status: DC | PRN

## 2016-12-21 MED ORDER — PANTOPRAZOLE SODIUM 40 MG PO TBEC
40.0000 mg | DELAYED_RELEASE_TABLET | Freq: Every day | ORAL | Status: DC
  Administered 2016-12-22 – 2016-12-23 (×2): 40 mg via ORAL
  Filled 2016-12-21 (×2): qty 1

## 2016-12-21 MED ORDER — FENTANYL CITRATE (PF) 100 MCG/2ML IJ SOLN
INTRAMUSCULAR | Status: DC | PRN
  Administered 2016-12-21: 10 ug via INTRATHECAL
  Administered 2016-12-21: 15 ug via INTRAVENOUS
  Administered 2016-12-21: 50 ug via INTRAVENOUS
  Administered 2016-12-21: 25 ug via INTRAVENOUS

## 2016-12-21 MED ORDER — TRANEXAMIC ACID 1000 MG/10ML IV SOLN
1000.0000 mg | Freq: Once | INTRAVENOUS | Status: AC | PRN
  Administered 2016-12-21: 1000 mg via INTRAVENOUS
  Filled 2016-12-21: qty 10

## 2016-12-21 MED ORDER — SCOPOLAMINE 1 MG/3DAYS TD PT72
1.0000 | MEDICATED_PATCH | Freq: Once | TRANSDERMAL | Status: DC
  Administered 2016-12-21: 1.5 mg via TRANSDERMAL
  Filled 2016-12-21: qty 1

## 2016-12-21 MED ORDER — PHENYLEPHRINE 8 MG IN D5W 100 ML (0.08MG/ML) PREMIX OPTIME
INJECTION | INTRAVENOUS | Status: DC | PRN
  Administered 2016-12-21: 40 ug/min via INTRAVENOUS

## 2016-12-21 MED ORDER — METHYLERGONOVINE MALEATE 0.2 MG/ML IJ SOLN: 0.2000 mg | Freq: Three times a day (TID) | INTRAMUSCULAR | Status: AC

## 2016-12-21 MED ORDER — DEXTROSE 5 % IV SOLN
1.0000 ug/kg/h | INTRAVENOUS | Status: DC | PRN
  Filled 2016-12-21: qty 2

## 2016-12-21 MED ORDER — ACETAMINOPHEN 325 MG PO TABS
650.0000 mg | ORAL_TABLET | ORAL | Status: DC | PRN
  Administered 2016-12-22 – 2016-12-23 (×3): 650 mg via ORAL
  Filled 2016-12-21 (×3): qty 2

## 2016-12-21 MED ORDER — OXYTOCIN 40 UNITS IN LACTATED RINGERS INFUSION - SIMPLE MED: 2.5000 [IU]/h | INTRAVENOUS | Status: AC

## 2016-12-21 MED ORDER — LACTATED RINGERS IV SOLN
INTRAVENOUS | Status: DC
  Administered 2016-12-21 (×3): via INTRAVENOUS

## 2016-12-21 SURGICAL SUPPLY — 31 items
BENZOIN TINCTURE PRP APPL 2/3 (GAUZE/BANDAGES/DRESSINGS) ×3 IMPLANT
CHLORAPREP W/TINT 26ML (MISCELLANEOUS) ×3 IMPLANT
CLOSURE WOUND 1/2 X4 (GAUZE/BANDAGES/DRESSINGS) ×1
CLOTH BEACON ORANGE TIMEOUT ST (SAFETY) ×3 IMPLANT
DECANTER SPIKE VIAL GLASS SM (MISCELLANEOUS) ×3 IMPLANT
DRSG OPSITE POSTOP 4X10 (GAUZE/BANDAGES/DRESSINGS) ×3 IMPLANT
ELECT REM PT RETURN 9FT ADLT (ELECTROSURGICAL) ×3
ELECTRODE REM PT RTRN 9FT ADLT (ELECTROSURGICAL) ×1 IMPLANT
GAUZE SPONGE 4X4 12PLY STRL LF (GAUZE/BANDAGES/DRESSINGS) ×6 IMPLANT
GLOVE BIOGEL PI IND STRL 7.0 (GLOVE) ×4 IMPLANT
GLOVE BIOGEL PI INDICATOR 7.0 (GLOVE) ×8
GLOVE ECLIPSE 6.5 STRL STRAW (GLOVE) ×9 IMPLANT
GOWN STRL REUS W/ TWL LRG LVL3 (GOWN DISPOSABLE) ×2 IMPLANT
GOWN STRL REUS W/TWL LRG LVL3 (GOWN DISPOSABLE) ×4
NEEDLE HYPO 22GX1.5 SAFETY (NEEDLE) ×3 IMPLANT
NS IRRIG 1000ML POUR BTL (IV SOLUTION) ×3 IMPLANT
PAD ABD 7.5X8 STRL (GAUZE/BANDAGES/DRESSINGS) ×3 IMPLANT
PAD OB MATERNITY 4.3X12.25 (PERSONAL CARE ITEMS) ×3 IMPLANT
PAD PREP 24X48 CUFFED NSTRL (MISCELLANEOUS) ×3 IMPLANT
RETAINER VISCERAL (MISCELLANEOUS) ×3 IMPLANT
RETRACTOR WND ALEXIS 25 LRG (MISCELLANEOUS) IMPLANT
RTRCTR WOUND ALEXIS 25CM LRG (MISCELLANEOUS)
STRIP CLOSURE SKIN 1/2X4 (GAUZE/BANDAGES/DRESSINGS) ×2 IMPLANT
SUT PLAIN 2 0 XLH (SUTURE) ×3 IMPLANT
SUT VIC AB 0 CT1 36 (SUTURE) ×6 IMPLANT
SUT VIC AB 2-0 CT1 27 (SUTURE) ×2
SUT VIC AB 2-0 CT1 TAPERPNT 27 (SUTURE) ×1 IMPLANT
SUT VIC AB 4-0 KS 27 (SUTURE) ×3 IMPLANT
SYR CONTROL 10ML LL (SYRINGE) ×3 IMPLANT
TOWEL OR 17X24 6PK STRL BLUE (TOWEL DISPOSABLE) ×9 IMPLANT
TRAY FOLEY CATH SILVER 16FR (SET/KITS/TRAYS/PACK) ×3 IMPLANT

## 2016-12-21 NOTE — Anesthesia Preprocedure Evaluation (Signed)
Anesthesia Evaluation  Patient identified by MRN, date of birth, ID band Patient awake    Reviewed: Allergy & Precautions, NPO status , Patient's Chart, lab work & pertinent test results  Airway Mallampati: II  TM Distance: >3 FB Neck ROM: Full    Dental no notable dental hx.    Pulmonary asthma , Current Smoker,    Pulmonary exam normal breath sounds clear to auscultation       Cardiovascular hypertension, Normal cardiovascular exam Rhythm:Regular Rate:Normal     Neuro/Psych negative neurological ROS  negative psych ROS   GI/Hepatic negative GI ROS, Neg liver ROS,   Endo/Other  negative endocrine ROS  Renal/GU negative Renal ROS  negative genitourinary   Musculoskeletal negative musculoskeletal ROS (+)   Abdominal   Peds negative pediatric ROS (+)  Hematology negative hematology ROS (+)   Anesthesia Other Findings grandmultip  Reproductive/Obstetrics negative OB ROS                             Anesthesia Physical Anesthesia Plan  ASA: II  Anesthesia Plan: Combined Spinal and Epidural   Post-op Pain Management:    Induction:   Airway Management Planned: Natural Airway  Additional Equipment:   Intra-op Plan:   Post-operative Plan:   Informed Consent: I have reviewed the patients History and Physical, chart, labs and discussed the procedure including the risks, benefits and alternatives for the proposed anesthesia with the patient or authorized representative who has indicated his/her understanding and acceptance.   Dental advisory given  Plan Discussed with: CRNA  Anesthesia Plan Comments:         Anesthesia Quick Evaluation

## 2016-12-21 NOTE — Op Note (Signed)
Cesarean Section Operative Report  Tamara Bradford  12/21/2016  Indications: Scheduled Proceedure/Maternal Request   Pre-operative Diagnosis: RCS.   Post-operative Diagnosis: Same   Surgeon: Surgeon(s) and Role:    Tamara Bradford, Tamara Niles, MD     * Adam PhenixArnold, Tamara G, MD     * Tamara Bradford, Tamara Michael, MD   Assistants: none  Anesthesia: spinal    Estimated Blood Loss: 700 ml  Specimens: none  Findings: Viable female infant in vertex presentation; Apgars 8 and 8; arterial cord pH note sent; clear amniotic fluid; intact placenta with three vessel cord; normal uterus, fallopian tubes and ovaries bilaterally.  *scarred fascia, bladder adhesions that were easily taken down.  Baby condition / location:  Couplet care / Skin to Skin   Complications: no complications  Indications: Tamara Bradford is a 41 y.o. [id number] with an IUP 5247w0d presenting for elective repeat c-section.  Procedure Details:  The patient was taken back to the operative suite where spinal anesthesia was placed.  A time out was held and the above information confirmed.   After induction of anesthesia, the patient was draped and prepped in the usual sterile manner and placed in a dorsal supine position with a leftward tilt. A Pfannenstiel incision was made just above her previous scar and carried down through the subcutaneous tissue to the fascia. Fascial incision was made and sharply extended transversely. The fascia was separated from the underlying rectus tissue superiorly with a combination of the mayo scissors, cautery and blunt dissection. The peritoneum was identified and sharply entered and extended longitudinally. Alexis retractor was placed. The bladder was noted to have some high adhesions which where easily taken down with Metzenbaum scissors and a bladder flap was created. A low transverse uterine incision was made and extended bluntly. Delivered from cephalic presentation was a viable infant with Apgars as  above.  After waiting 60 seconds for delayed cord cutting, the umbilical cord was clamped and cut cord blood was obtained for evaluation. Cord ph was not sent. The placenta was removed Intact and appeared normal. The uterine outline, tubes and ovaries appeared normal. The uterine incision was closed with running locked sutures of 0Vicryl with an imbricating layer of the same.   Hemostasis was observed. The peritoneum and muscle was closed together with 2- vicyrl a fish was used to move bowel and omentum from the field.. The rectus muscles were examined and hemostasis observed. The fascia was then reapproximated with running sutures of 0Vicryl starting from both corners.  A total of 10 ml 0.5% Marcaine was injected subcutaneously at the margins of the incision. TThe skin was closed with 3-0Vicryl.   1000 mg a lysteda was given at the end of the case.  Instrument, sponge, and needle counts were correct prior the abdominal closure and were correct at the conclusion of the case.     Disposition: PACU - hemodynamically stable.       SignedLes Pou: Tamara SchenkMD 12/21/2016 3:15 PM

## 2016-12-21 NOTE — Anesthesia Procedure Notes (Signed)
Spinal  Patient location during procedure: OR Staffing Anesthesiologist: Montez Hageman Performed: anesthesiologist  Preanesthetic Checklist Completed: patient identified, site marked, surgical consent, pre-op evaluation, timeout performed, IV checked, risks and benefits discussed and monitors and equipment checked Spinal Block Patient position: sitting Prep: DuraPrep Patient monitoring: heart rate, continuous pulse ox and blood pressure Approach: midline Location: L3-4 Injection technique: single-shot Needle Needle type: Sprotte  Needle gauge: 24 G Needle length: 9 cm Catheter type: closed end flexible Catheter size: 20 g Catheter at skin depth: 11 cm Additional Notes Expiration date of kit checked and confirmed.  CSE in the usual manner   Patient tolerated procedure well, without complications.

## 2016-12-21 NOTE — Anesthesia Postprocedure Evaluation (Signed)
Anesthesia Post Note  Patient: Tamara Bradford  Procedure(s) Performed: Procedure(s) (LRB): CESAREAN SECTION (N/A)  Patient location during evaluation: Mother Baby Anesthesia Type: Combined Spinal/Epidural Level of consciousness: awake, awake and alert, oriented and patient cooperative Pain management: pain level controlled Vital Signs Assessment: post-procedure vital signs reviewed and stable Respiratory status: spontaneous breathing, nonlabored ventilation and respiratory function stable Cardiovascular status: stable Postop Assessment: no headache, no backache, no signs of nausea or vomiting and patient able to bend at knees Anesthetic complications: no        Last Vitals:  Vitals:   12/21/16 1725 12/21/16 1836  BP: 125/80 113/74  Pulse: 85 85  Resp: 20 20  Temp: 36.3 C 36.4 C    Last Pain:  Vitals:   12/21/16 1836  TempSrc: Oral  PainSc:    Pain Goal: Patients Stated Pain Goal: 0 (12/21/16 1723)               Kenitra Leventhal L

## 2016-12-21 NOTE — H&P (Signed)
Obstetric Preoperative History and Physical  Tamara Bradford is a 42 y.o. Z61W96 with IUP at [redacted]w[redacted]d presenting for scheduled cesarean section.  Patient reports that with her last c-section she was told she had minimal scar tissue.  Prenatal Course Source of Care: [facility]  with onset of care at 22 wks Pregnancy complications or risks: Patient Active Problem List   Diagnosis Date Noted  . Pelvic adhesive disease 11/03/2016  . Traumatic injury during pregnancy in third trimester 10/28/2016  . Supervision of high-risk pregnancy 08/30/2016  . History of placenta abruption 08/30/2016  . History of pre-eclampsia in prior pregnancy, currently pregnant in second trimester 08/30/2016  . History of 3 cesarean sections 08/30/2016  . Elderly multigravida in second trimester 08/09/2016  . History of gestational hypertension 08/09/2016  . Uterine scar from previous cesarean delivery affecting pregnancy 08/09/2016  . Grand multiparity 08/09/2016  . Current smoker 08/09/2016  . Mild intermittent asthma 07/14/2016  . GERD (gastroesophageal reflux disease) 07/14/2016  . Tobacco use disorder 07/14/2016  . Atypical squamous cells of undetermined significance on cytologic smear of cervix (ASC-US) 06/21/2016   She plans to breastfeed She desires nexplanon for postpartum contraception.   Prenatal labs and studies: ABO, Rh: --/--/O POS (05/22 1225) Antibody: NEG (05/22 1225) Rubella: Immune (11/17 0000) RPR: Non Reactive (05/22 1225)  HBsAg: Negative (11/17 0000)  HIV: Non Reactive (03/06 0834)  Anatomy US normal  Prenatal Transfer Tool  Maternal Diabetes: No Genetic Screening: Declined Maternal Ultrasounds/Referrals: Normal Fetal Ultrasounds or other Referrals:  None Maternal Substance Abuse:  Yes:  Type: Other: amphetamines Significant Maternal Medications:  None Significant Maternal Lab Results: None  Past Medical History:  Diagnosis Date  . Asthma   . Depression   . Pregnancy induced  hypertension   . Preterm labor     Past Surgical History:  Procedure Laterality Date  . CESAREAN SECTION      OB History  Gravida Para Term Preterm AB Living  17 16 15 1   14   SAB TAB Ectopic Multiple Live Births          16    # Outcome Date GA Lbr Len/2nd Weight Sex Delivery Anes PTL Lv  17 Current           16 Term 04/13/15    F CS-LTranv   LIV  15 Term 04/23/13    F CS-LTranv   LIV  14 Term 01/17/12     CS-LTranv   LIV     Complications: Abruptio Placenta  13 Term 12/22/10    M Vag-Spont   LIV  12 Term 09/13/09    F Vag-Spont   LIV  11 Term 03/11/08    F Vag-Spont   LIV  10 Term 05/13/05    M Vag-Spont   DEC  9 Term 03/22/03    M Vag-Spont   LIV  8 Term 10/12/00    M Vag-Spont   LIV  7 Term 02/17/00    F Vag-Spont   LIV  6 Term 07/10/98    F Vag-Spont   LIV  5 Term 04/1996     Vag-Spont   ND     Complications: SIDS (sudden infant death syndrome)  4 Term 30-Jan-1995    F Vag-Spont   LIV  3 Term 11/10/93    F Vag-Spont   LIV  2 Term 08/08/92    M Vag-Spont   LIV  1 Preterm 09/01/88    M Vag-Spont  Y LIV  Social History   Social History  . Marital status: Single    Spouse name: N/A  . Number of children: N/A  . Years of education: N/A   Social History Main Topics  . Smoking status: Current Some Day Smoker    Packs/day: 0.25    Years: 22.00    Types: Cigarettes    Start date: 08/05/1993  . Smokeless tobacco: Current User     Comment: Peak rate of 1ppd - Quit for 1 year previously  . Alcohol use No     Comment: Occasionally - but none now  . Drug use: No  . Sexual activity: Yes   Other Topics Concern  . None   Social History Narrative   [facility]:   Originally [place]. 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 No pets currently. Previous home had mold (white mold). No bird or hot tub exposure. Currently staying motel with mold under bed.      Family History  Problem Relation Age of Onset  . Prostate cancer Father   . Colon cancer Father   . GER disease Brother   . Lung disease Neg Hx     Prescriptions Prior to Admission  Medication Sig Dispense Refill Last Dose  . acetaminophen (TYLENOL) 500 MG tablet Take 1,000 mg by mouth every 6 (six) hours as needed (for pain.).     Marland Kitchen. albuterol (PROVENTIL HFA;VENTOLIN HFA) 108 (90 Base) MCG/ACT inhaler Inhale 2 puffs into the lungs every 6 (six) hours as needed for wheezing or shortness of breath. 1 Inhaler 2 12/14/2016 at Unknown time  . aspirin EC 81 MG tablet Take 1 tablet (81 mg total) by mouth daily. (Patient taking differently: Take 81 mg by mouth daily with breakfast. ) 30 tablet 3 12/13/2016 at Unknown time  . omeprazole (PRILOSEC) 20 MG capsule Take 1 capsule (20 mg total) by mouth 2 (two) times daily before a meal. 30 capsule 5 12/13/2016 at Unknown time  . predniSONE (DELTASONE) 10 MG tablet Take 4 tablets (40 mg total) by mouth daily with breakfast. 20 tablet 0 12/16/2016 at Unknown time  . Prenatal Vit-Fe Fumarate-FA (MULTIVITAMIN-PRENATAL) 27-0.8 MG TABS tablet Take 1 tablet by mouth daily at 12 noon. (Patient taking differently: Take 1 tablet by mouth daily with breakfast. ) 30 each 6 12/16/2016 at Unknown time  . budesonide (PULMICORT) 180 MCG/ACT inhaler Inhale 2 puffs into the lungs 2 (two) times daily. (Patient not taking: Reported on 12/14/2016) 1 Inhaler 10 Not Taking at Unknown time    Allergies  Allergen Reactions  . Cinnamon Hives  . Keflex [Cephalexin] Hives  . Naproxen Rash    Review of Systems: Negative except for what is mentioned in HPI.  Physical Exam: BP (!) 143/90   Pulse 97   Temp 98.1 F (36.7 C) (Oral)   Ht 5\' 7"  (1.702 m)   Wt 182 lb (82.6 kg)   LMP 03/23/2016 (Exact Date)   BMI 28.51 kg/m  FHR by Doppler: 155 bpm CONSTITUTIONAL: Well-developed, well-nourished female in no acute distress.  HENT:  Normocephalic, atraumatic, External right and  left ear normal. Oropharynx is clear and moist EYES: Conjunctivae and EOM are normal. Pupils are equal, round, and reactive to light. No scleral icterus.  NECK: Normal range of motion, supple, no masses SKIN: Skin is warm and dry. No rash noted. Not diaphoretic. No erythema. No pallor. NEUROLGIC: Alert and oriented  to person, place, and time. Normal reflexes, muscle tone coordination. No cranial nerve deficit noted. PSYCHIATRIC: Normal mood and affect. Normal behavior. Normal judgment and thought content. CARDIOVASCULAR: Normal heart rate noted, regular rhythm RESPIRATORY: Effort and breath sounds normal, no problems with respiration noted ABDOMEN: Soft, nontender, nondistended, gravid. Well-healed Pfannenstiel incision. PELVIC: Deferred MUSCULOSKELETAL: Normal range of motion. No edema and no tenderness. 2+ distal pulses.   Pertinent Labs/Studies:   Results for orders placed or performed during the hospital encounter of 12/21/16 (from the past 72 hour(s))  Prepare RBC (crossmatch)     Status: None   Collection Time: 12/21/16 11:30 AM  Result Value Ref Range   Order Confirmation ORDER PROCESSED BY BLOOD BANK     Assessment and Plan :Anberlin Diez is a 41 y.o. Z61W96 at [redacted]w[redacted]d being admitted for scheduled cesarean section. The risks of cesarean section discussed with the patient included but were not limited to: bleeding which may require transfusion or reoperation; infection which may require antibiotics; injury to bowel, bladder, ureters or other surrounding organs; injury to the fetus; need for additional procedures including hysterectomy in the event of a life-threatening hemorrhage; placental abnormalities wth subsequent pregnancies, incisional problems, thromboembolic phenomenon and other postoperative/anesthesia complications. The patient concurred with the proposed plan, giving informed written consent for the procedure. Patient has been NPO since last night she will remain NPO for  procedure. Anesthesia and OR aware. Preoperative prophylactic antibiotics and SCDs ordered on call to the OR. To OR when ready.   Patient at significant risk of PPH with G17 and three previous c-sections. Will have hemabate, methergine, and lysteda in room ready for use if need be.  D/W patient her increased risk of PPH.   Ernestina Penna MD OB Fellow Faculty Practice, [facility]

## 2016-12-21 NOTE — Lactation Note (Signed)
This note was copied from a baby's chart. Lactation Consultation Note  Patient Name: Tamara Bradford JYNWG'NToday's Date: 12/21/2016 Reason for consult: Initial assessment Baby at 4 hr of life. Upon entry baby had a shallow latch in cradle position. Showed mom how the get a deep latch in cross cradle. Mom reports bf all 16 of her other children for 3-4 months each. Mom asked when she should start pumping, suggested she focus on letting the baby learn to bf for today. Discussed baby behavior, feeding frequency, baby belly size, voids, wt loss, breast changes, and nipple care. Demonstrated manual expression, colostrum noted bilaterally. Given lactation handouts. Aware of OP services and support group. Mom will offer the breast on demand 8+/24hr.    Maternal Data Has patient been taught Hand Expression?: Yes Does the patient have breastfeeding experience prior to this delivery?: Yes  Feeding Feeding Type: Breast Fed Length of feed: 23 min  LATCH Score/Interventions Latch: Grasps breast easily, tongue down, lips flanged, rhythmical sucking.  Audible Swallowing: A few with stimulation Intervention(s): Skin to skin;Hand expression  Type of Nipple: Everted at rest and after stimulation  Comfort (Breast/Nipple): Soft / non-tender     Hold (Positioning): Assistance needed to correctly position infant at breast and maintain latch. Intervention(s): Support Pillows;Position options  LATCH Score: 8  Lactation Tools Discussed/Used WIC Program: Yes   Consult Status Consult Status: Follow-up Date: 12/22/16 Follow-up type: In-patient    [name] 12/21/2016, 6:42 PM

## 2016-12-21 NOTE — Transfer of Care (Signed)
Immediate Anesthesia Transfer of Care Note  Patient: Tamara Bradford  Procedure(s) Performed: Procedure(s): CESAREAN SECTION (N/A)  Patient Location: PACU  Anesthesia Type:Spinal  Level of Consciousness: awake  Airway & Oxygen Therapy: Patient Spontanous Breathing  Post-op Assessment: Report given to RN and Post -op Vital signs reviewed and stable  Post vital signs: Reviewed and stable  Last Vitals:  Vitals:   12/21/16 1134  BP: (!) 143/90  Pulse: 97  Temp: 36.7 C    Last Pain:  Vitals:   12/21/16 1134  TempSrc: Oral         Complications: No apparent anesthesia complications

## 2016-12-22 LAB — CBC
HCT: 32.7 % — ABNORMAL LOW (ref 36.0–46.0)
HEMOGLOBIN: 10.6 g/dL — AB (ref 12.0–15.0)
MCH: 29.2 pg (ref 26.0–34.0)
MCHC: 32.4 g/dL (ref 30.0–36.0)
MCV: 90.1 fL (ref 78.0–100.0)
PLATELETS: 213 10*3/uL (ref 150–400)
RBC: 3.63 MIL/uL — ABNORMAL LOW (ref 3.87–5.11)
RDW: 15.8 % — ABNORMAL HIGH (ref 11.5–15.5)
WBC: 13.2 10*3/uL — ABNORMAL HIGH (ref 4.0–10.5)

## 2016-12-22 NOTE — Plan of Care (Signed)
Problem: Pain Management: Goal: General experience of comfort will improve and pain level will decrease Outcome: Progressing Per Dr Natale MilchLancaster ibuprofen orders to remain PRN due to risk of low-severity reaction. Pt has an allergy to naproxen, another NSAID.

## 2016-12-22 NOTE — Progress Notes (Signed)
Pt. Received 2 Albuterol treatments today. BBS both times clear, equal, good aeration. Pt. In no distress. Teaching service was called for the discontinuation of treatments, and we are awaiting the order.

## 2016-12-22 NOTE — Progress Notes (Signed)
I have examined this patient and agree.
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
## 2016-12-22 NOTE — Progress Notes (Signed)
Post Partum Day 1  Subjective:  Tamara Bradford is a 41 y.o. [id number] 8677w0d s/p RLTCS.  No acute events overnight.  Pt has not ambulated and is still using foley cath to void. She has not had problems with po intake though she did have nausea and vomiting yesterday once.  Pain is well controlled.  She has had flatus. She has not had bowel movement.  Plan for birth control is nexplanon.  Method of Feeding: breast and bottle feeding  Objective: BP 121/64 (BP Location: Left Arm)   Pulse 67   Temp 98 F (36.7 C) (Axillary)   Resp 20   Ht 5\' 7"  (1.702 m)   Wt 82.6 kg (182 lb)   LMP 03/23/2016 (Exact Date)   SpO2 100%   Breastfeeding? Unknown   BMI 28.51 kg/m   Physical Exam:  General: alert, cooperative and no distress Chest: normal work of breathing  Abdomen: soft, nontender, incision is dry Extremities: no edema   Recent Labs  12/20/16 1225 12/22/16 0542  HGB 10.3* 10.6*  HCT 31.4* 32.7*    Assessment/Plan:  ASSESSMENT: Tamara Bradford is a 41 y.o. [id number] 477w0d ppd #1 s/p RLTCS doing well.   Continue current care, reassess for discharge tomorrow   LOS: 1 day   Ivan AnchorsJohn Adeolu [facility] 12/22/2016, 7:42 AM

## 2016-12-22 NOTE — Lactation Note (Signed)
This note was copied from a baby's chart. Lactation Consultation Note  Patient Name: Tamara Bradford [id number]'QToday's Date: 12/22/2016 Reason for consult: Follow-up assessment Baby at 30 hr of life. Upon entry family was resting. Mom stated bf is "ok". Her "whole body hurts". She denies specific bf related pain. She voiced no bf concerns. She is offering formula bottles in between bf because she thinks the baby is still hungry. Briefly reviewed breast changes and encouraged her to call for a latch check at the next bf.   Maternal Data    Feeding Feeding Type: Breast Fed  LATCH Score/Interventions                      Lactation Tools Discussed/Used     Consult Status Consult Status: Follow-up Date: 12/23/16 Follow-up type: In-patient    Rulon Eisenmengerlizabeth E Brandin Stetzer 12/22/2016, 8:18 PM

## 2016-12-22 NOTE — Plan of Care (Signed)
Problem: Skin Integrity: Goal: Risk for impaired skin integrity will decrease Outcome: Progressing Pt's original honeycomb dressing became saturated and bled through to pressure dressing around 2200, contacted on-call resident Dr Natale MilchLancaster for new dressing order, changed honeycomb and pressure dressing.

## 2016-12-23 ENCOUNTER — Ambulatory Visit: Payer: Self-pay

## 2016-12-23 MED ORDER — OXYCODONE HCL 5 MG PO TABS: 5.0000 mg | ORAL_TABLET | ORAL | 0 refills | Status: DC | PRN

## 2016-12-23 NOTE — Progress Notes (Signed)
Post Partum Day 2  Subjective:  Tamara Bradford is a 41 y.o. [id number] 5670w0d s/p RLTCS.  No acute events overnight.  Pt denies problems with ambulating, voiding or po intake.  She denies nausea or vomiting.  Pain is well controlled.  She has had flatus. She has not had bowel movement.  Lochia Minimal.  Plan for birth control is nexplanon.  Method of Feeding: breast and bottle feeding  Objective: BP 121/68   Pulse 78   Temp 98.1 F (36.7 C)   Resp 18   Ht 5\' 7"  (1.702 m)   Wt 82.6 kg (182 lb)   LMP 03/23/2016 (Exact Date)   SpO2 99%   Breastfeeding? Unknown   BMI 28.51 kg/m   Physical Exam:  General: alert, cooperative and no distress Chest: normal work of breathing  Abdomen: soft, nontender, incision is dry Uterine Fundus: firm below umbilicus DVT Evaluation: No evidence of DVT seen on physical exam. Extremities: no edema   Recent Labs  12/20/16 1225 12/22/16 0542  HGB 10.3* 10.6*  HCT 31.4* 32.7*    Assessment/Plan:  ASSESSMENT: Tamara Bradford is a 41 y.o. [id number] 4070w0d ppd #2 s/p RLTCS doing well.   Discharge home   LOS: 2 days   Ivan AnchorsJohn Adeolu [facility] 12/23/2016, 9:04 AM   I have seen and examined this patient and agree with the management plan.  See my note

## 2016-12-23 NOTE — Lactation Note (Signed)
This note was copied from a baby's chart. Lactation Consultation Note  Patient Name: [name] ZOXWR'UToday's Date: 12/23/2016 Reason for consult: Follow-up assessment  Follow up visit at 52 hours of age.  Baby has breast fed 5 times and had 4 bottles of 15-2 mls, with 4 voids and 1 stool.  Mom requests a "plastic nipple' because she has an "inverted" nipple.  Mom has large everted nipples with left nipple slightly inverted.  Mom hand expressed easily with colostrum flowing.  LC advised mom may not need a NS and can call for [facility] assist with next feeding.  LC advised mom that she would need to pump if using a nipple shield and may want to just work on latching.  Mom reports some nipple pain although nipple appears WNL and intact.  Baby asleep on FOB and mom reports recent feeding.     Maternal Data    Feeding Feeding Type: Bottle Fed - Formula Nipple Type: Slow - flow Length of feed: 15 min  LATCH Score/Interventions          Comfort (Breast/Nipple): Filling, red/small blisters or bruises, mild/mod discomfort           Lactation Tools Discussed/Used     Consult Status Consult Status: Follow-up Date: 12/24/16 Follow-up type: In-patient    Melvin Whiteford, Arvella MerlesJana Lynn 12/23/2016, 6:12 PM

## 2016-12-23 NOTE — Discharge Instructions (Signed)

## 2016-12-23 NOTE — Discharge Summary (Signed)
    OB Discharge Summary     Patient Name: Tamara Bradford DOB: 11/26/1975 MRN: [id number]  Date of admission: 12/21/2016 Delivering MD: Federico FlakeNEWTON, KIMBERLY NILES   Date of discharge: 12/23/2016  Admitting diagnosis: RCS Intrauterine pregnancy: 2752w0d     Secondary diagnosis:  Active Problems:   Grand multiparity   Supervision of high-risk pregnancy   History of 3 cesarean sections   Previous cesarean delivery affecting pregnancy  Additional problems: none     Discharge diagnosis: Term Pregnancy Delivered                                                                                                Post partum procedures:none  Augmentation: n/a  Complications: None  Hospital course:  Sceduled C/S   41 y.o. yo [id number] at 2652w0d was admitted to the hospital 12/21/2016 for scheduled cesarean section with the following indication:Elective Repeat.  Membrane Rupture Time/Date: 2:02 PM ,12/21/2016   Patient delivered a Viable infant.12/21/2016  Details of operation can be found in separate operative note.  Pateint had an uncomplicated postpartum course.  She is ambulating, tolerating a regular diet, passing flatus, and urinating well. Patient is discharged home in stable condition on  12/23/16         Physical exam  Vitals:   12/22/16 0800 12/22/16 0850 12/22/16 1806 12/23/16 0520  BP: (!) 113/56  (!) 117/58 121/68  Pulse: 60  (!) 106 78  Resp: 18 14 18 18   Temp: 97.6 F (36.4 C)  98.6 F (37 C) 98.1 F (36.7 C)  TempSrc: Oral  Oral   SpO2: 99%     Weight:      Height:       General: alert, cooperative and no distress Lochia: appropriate Uterine Fundus: firm Incision: Healing well with no significant drainage, No significant erythema, Dressing is clean, dry, and intact DVT Evaluation: No evidence of DVT seen on physical exam. Negative Homan's sign. No cords or calf tenderness. Labs: Lab Results  Component Value Date   WBC 13.2 (H) 12/22/2016   HGB 10.6 (L) 12/22/2016   HCT  32.7 (L) 12/22/2016   MCV 90.1 12/22/2016   PLT 213 12/22/2016   No flowsheet data found.  Discharge instruction: per After Visit Summary and "Baby and Me Booklet".  After visit meds:  percocet  Diet: home with mother  Activity: Advance as tolerated. Pelvic rest for 6 weeks.   Outpatient follow up:4 weeks Follow up Appt:No future appointments. Follow up Visit:No Follow-up on file.  Postpartum contraception: Nexplanon  Newborn Data: Live born female  Birth Weight: 7 lb 8.8 oz (3425 g) APGAR: 8, 8  Baby Feeding: Bottle Disposition:home with mother   12/23/2016 Greig RightRESENZO-DISHMAN,Tamara Bradford, CNM

## 2016-12-23 NOTE — Progress Notes (Signed)
CPS case has been accepted and assigned to A. Guerrero/[phone].  CSW spoke with Ms. Guerrero who states she will be on her way to the hospital in 15 minutes to meet with the parents.  CSW informed bedside RN. 

## 2016-12-23 NOTE — Progress Notes (Signed)
CSW spoke with CPS worker who states she needs to receive requested records from CPS in [place] before a safe disposition plan can be made.  Pediatrician aware that baby will need to remain in the hospital until Tuesday, 12/27/16. 

## 2016-12-24 ENCOUNTER — Ambulatory Visit: Payer: Self-pay

## 2016-12-24 LAB — TYPE AND SCREEN
ABO/RH(D): O POS
Antibody Screen: NEGATIVE
UNIT DIVISION: 0
Unit division: 0

## 2016-12-24 LAB — BPAM RBC
BLOOD PRODUCT EXPIRATION DATE: 201806202359
Blood Product Expiration Date: 201806202359
UNIT TYPE AND RH: 5100
Unit Type and Rh: 5100

## 2016-12-24 NOTE — Progress Notes (Signed)
CSW spoke with [place] CPS worker, Lisa Gamon ([phone]), regarding MOB and FOB wanting to leave with infant AMA.  CSW updated CPS regarding MOB's clinical assessment and agreed plan between [place] CPS and MOB. CPS will consult with CPS supervisor and will follow-up with CSW.  Henrique Parekh Boyd-Gilyard, MSW, LCSW Clinical Social Work [phone]   

## 2016-12-24 NOTE — Progress Notes (Signed)
CSW and Teaching Service NP met with MOB and FOB at bedside. When hospital staff arrived, MOB was resting in the recliner and FOB was resting in the bed.  MOB and FOB were appropriate with attending to the infant's needs and appeared comfortable.  CSW inquired about the parent's wanting to leave the hospital today as oppose to Tuesday. MOB and FOB expressed feelings confusion and not having a clear understanding of the documents they signed with [facility] CPS.  CSW explained to the parents the consequences of leaving AMA and violating the safety agreement with [facility] CPS. Both parents were emotional but understanding. CSW agreed to meet with MOB and FOB on Monday and Tuesday to "check-in".  CSW also spoke House Coverage to get approval for MOB and FOB take baby to the nursery for approximately 30 minutes to allow the parent's to take a walk.  CSW updated bedside nurse and [facility] CPS.   Laurey Arrow, MSW, LCSW Clinical Social Work [phone]

## 2016-12-24 NOTE — Progress Notes (Signed)
CSW spoke with CPS worker Veneta PentonAmie Rodriguez, regarding MOB and FOB wanting to leave AMA.  CPS worker informed CSW that MOB's C[place] [facility] and CSW will need to contact [facility].  CSW left a message with [facility] nonemergency department and requested a call back.  CSW will follow-up with Teaching Service after speaking with [facility].  Blaine HamperAngel Boyd-Gilyard, MSW, LCSW Clinical Social Work [phone]

## 2016-12-24 NOTE — Lactation Note (Signed)
This note was copied from a baby's chart. Lactation Consultation Note  Patient Name: Tamara Bradford AVWUJ'WToday's Date: 12/24/2016 Reason for consult: Follow-up assessment;Breast/nipple pain   Follow up with mom of 76 hour old infant. Infant was asleep in crib. Mom asking about using a NS. Mom was noted to be severely engorged. Ice packs given and mom pumped with DEBP and obtained 20 cc EBM. Infant was then latched to right breast and fed for 10 minutes, she was still feeding when LC left room. Left breast is to taut for infant to latch at this time.  Discussed mom icing breasts for 10-20 minutes followed by pumping or BF every 2-3 hours to relieve engorgement and protect milk supply. Mom has not been latching infant to breast today. Discussed with mom that infant is the best pump. Discussed that infant can be evaluated for NS use once left breast is softer. Discussed that infant is the best pump and to offer breast with each feeding.   DEBP set up with instructions for use and cleaning. Enc mom to pump in Initiate setting until expressing 20 cc x 3 pumpings and then change to maintenance setting. Engorgement treatment for the Nursing Mother handout given and explained. Enc parents to go and get ice to refill ice packs as needed. [facility], Charity fundraiserN.      Maternal Data Has patient been taught Hand Expression?: Yes Does the patient have breastfeeding experience prior to this delivery?: Yes  Feeding Feeding Type: Breast Fed Length of feed: 15 min (infant still feeding when LC left room)  LATCH Score/Interventions Latch: Grasps breast easily, tongue down, lips flanged, rhythmical sucking.  Audible Swallowing: Spontaneous and intermittent Intervention(s): Alternate breast massage;Hand expression;Skin to skin  Type of Nipple: Everted at rest and after stimulation  Comfort (Breast/Nipple): Engorged, cracked, bleeding, large blisters, severe discomfort Problem noted: Engorgment Intervention(s):  Ice;Hand expression     Hold (Positioning): No assistance needed to correctly position infant at breast. Intervention(s): Breastfeeding basics reviewed;Support Pillows;Position options;Skin to skin  LATCH Score: 8  Lactation Tools Discussed/Used Tools: Pump Breast pump type: Double-Electric Breast Pump Pump Review: Setup, frequency, and cleaning;Milk Storage Initiated by:: Noralee StainSharon Dulse Rutan, RN, IBCLC Date initiated:: 12/24/16   Consult Status Consult Status: Follow-up Date: 12/25/16 Follow-up type: In-patient    Silas FloodSharon S Alsha Meland 12/24/2016, 8:02 PM

## 2016-12-25 ENCOUNTER — Ambulatory Visit: Payer: Self-pay

## 2016-12-25 NOTE — Lactation Note (Signed)
This note was copied from a baby's chart. Lactation Consultation Note  Mother states she is only formula feeding now. Her breasts have softened some from fully engorged last night.  Offered ice and she refused.  Patient Name: Tamara Haywood Fillerleshia Henry ONGEX'BToday's Date: 12/25/2016     Maternal Data    Feeding Feeding Type: Formula Nipple Type: Slow - flow  LATCH Score/Interventions                      Lactation Tools Discussed/Used     Consult Status      Hardie PulleyBerkelhammer, Ruth Boschen 12/25/2016, 2:41 PM

## 2016-12-26 NOTE — Progress Notes (Signed)
CSW met with MOB and FOB in room 146.  When CSW arrived, MOB and FOB were resting in bed and infant was asleep in her bassinet. Both parent's was polite and receptive to meeting with CSW.  MOB inquired about a transfer of custody form for infant( MOB want to give FOB full custody). CSW provided MOB with resources for legal consultation and encouraged MOB to communicate MOB's request to CPS. MOB openly discussed MOB's past hx of CPS involvement and reported that MOB currently has 17 children; 8 were placed in CPS custody in [place].  MOB admits to a hx of substance abuse and reports being in sobriety for over 1 year.  CSW praised MOB for MOB's sobriety and encouraged MOB to continue to connect with community resources in effort to continue MOB's sobriety.  MOB and FOB reports having all necessary items for infant and denied all other psychosocial stressors.  CSW agreed to meet with parent's on tomorrow (12/27/16) prior to infant's d/c to re-assess for psychosocial needs.   Tamara Bradford, MSW, LCSW Clinical Social Work [phone]   

## 2016-12-27 NOTE — Progress Notes (Signed)
CSW spoke with [facility] worker, Sallye LatKim Craven, via telephone.  CSW inquired about CPS safety plan and assessment for family.  CPS informed CSW that CPS is currently reviewing information and attempting to make contact with [facility] [place] [facility].  CPS will contact CSW when CPS confirm and received additional information from [place].   Blaine HamperAngel Boyd-Gilyard, MSW, LCSW Clinical Social Work [phone]

## 2017-02-09 NOTE — Anesthesia Postprocedure Evaluation (Signed)
Anesthesia Post Note  Patient: Tamara Bradford  Procedure(s) Performed: Procedure(s) (LRB): CESAREAN SECTION (N/A)     Anesthesia Post Evaluation  Last Vitals:  Vitals:   12/23/16 0520 12/23/16 1017  BP: 121/68 123/76  Pulse: 78 83  Resp: 18 20  Temp: 36.7 C 37 C    Last Pain:  Vitals:   12/23/16 1056  TempSrc:   PainSc: 3                  Phillips Groutarignan, Kadarius Cuffe

## 2017-02-09 NOTE — Addendum Note (Signed)
Addendum  created 02/09/17 1445 by Phillips Groutarignan, Jymir Dunaj, MD   Sign clinical note

## 2017-04-11 ENCOUNTER — Encounter (HOSPITAL_COMMUNITY): Payer: Self-pay | Admitting: *Deleted

## 2017-04-11 ENCOUNTER — Emergency Department (HOSPITAL_COMMUNITY)
Admission: EM | Admit: 2017-04-11 | Discharge: 2017-04-11 | Disposition: A | Discharge status: Home / Self Care | Payer: Self-pay | Attending: Emergency Medicine | Admitting: Emergency Medicine

## 2017-04-11 ENCOUNTER — Encounter (HOSPITAL_COMMUNITY): Payer: Self-pay | Admitting: Emergency Medicine

## 2017-04-11 DIAGNOSIS — R064 Hyperventilation: Secondary | ICD-10-CM | POA: Insufficient documentation

## 2017-04-11 DIAGNOSIS — Y999 Unspecified external cause status: Secondary | ICD-10-CM | POA: Insufficient documentation

## 2017-04-11 DIAGNOSIS — F17210 Nicotine dependence, cigarettes, uncomplicated: Secondary | ICD-10-CM | POA: Insufficient documentation

## 2017-04-11 DIAGNOSIS — Y939 Activity, unspecified: Secondary | ICD-10-CM | POA: Insufficient documentation

## 2017-04-11 DIAGNOSIS — W25XXXA Contact with sharp glass, initial encounter: Secondary | ICD-10-CM | POA: Insufficient documentation

## 2017-04-11 DIAGNOSIS — R062 Wheezing: Secondary | ICD-10-CM | POA: Insufficient documentation

## 2017-04-11 DIAGNOSIS — Z79899 Other long term (current) drug therapy: Secondary | ICD-10-CM | POA: Insufficient documentation

## 2017-04-11 DIAGNOSIS — J45909 Unspecified asthma, uncomplicated: Secondary | ICD-10-CM | POA: Insufficient documentation

## 2017-04-11 DIAGNOSIS — Y929 Unspecified place or not applicable: Secondary | ICD-10-CM | POA: Insufficient documentation

## 2017-04-11 DIAGNOSIS — S91311A Laceration without foreign body, right foot, initial encounter: Secondary | ICD-10-CM | POA: Insufficient documentation

## 2017-04-11 LAB — POC URINE PREG, ED: PREG TEST UR: NEGATIVE

## 2017-04-11 MED ORDER — DOXYCYCLINE HYCLATE 100 MG PO CAPS: 100.0000 mg | ORAL_CAPSULE | Freq: Two times a day (BID) | ORAL | 0 refills | Status: DC

## 2017-04-11 MED ORDER — ALBUTEROL SULFATE HFA 108 (90 BASE) MCG/ACT IN AERS
2.0000 | INHALATION_SPRAY | RESPIRATORY_TRACT | Status: DC | PRN
  Administered 2017-04-11: 2 via RESPIRATORY_TRACT
  Filled 2017-04-11: qty 6.7

## 2017-04-11 MED ORDER — IPRATROPIUM-ALBUTEROL 0.5-2.5 (3) MG/3ML IN SOLN
3.0000 mL | Freq: Once | RESPIRATORY_TRACT | Status: AC
  Administered 2017-04-11: 3 mL via RESPIRATORY_TRACT
  Filled 2017-04-11: qty 3

## 2017-04-11 NOTE — Discharge Instructions (Signed)
Use your inhaler 2 puffs every 4 hours as needed for wheezing or shortness of breath.

## 2017-04-11 NOTE — Discharge Instructions (Signed)
Please read attached information. If you experience any new or worsening signs or symptoms please return to the emergency room for evaluation. Please follow-up with your primary care provider or specialist as discussed. Please use medication prescribed only as directed and discontinue taking if you have any concerning signs or symptoms.   °
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
## 2017-04-11 NOTE — ED Provider Notes (Signed)
[facility]-EMERGENCY DEPT Provider Note   CSN: [id number] Arrival date & time: 04/11/17  1136     History   Chief Complaint Chief Complaint  Patient presents with  . Wound Infection    HPI Tamara Bradford is a 41 y.o. female.  HPI    41 year old female presents today with complaints of foot pain.  Patient notes that approximately 3 weeks ago she cut the dorsal aspect of her right foot on glass.  Patient notes that she went a week and did not have any interventions.  She was jailed for approximately 1 week where they started her on amoxicillin.  She notes continued pain over the dorsal aspect of the right foot with a open laceration.  She denies any discharge, swelling, warmth, redness, fever.  She reports full active range of motion of the toes, she does note pain surrounding the wound.  Patient reports ibuprofen has not been providing significant relief of symptoms.  Past Medical History:  Diagnosis Date  . Asthma   . Depression   . Pregnancy induced hypertension   . Preterm labor     Patient Active Problem List   Diagnosis Date Noted  . Previous cesarean delivery affecting pregnancy 12/21/2016  . Pelvic adhesive disease 11/03/2016  . Traumatic injury during pregnancy in third trimester 10/28/2016  . Supervision of high-risk pregnancy 08/30/2016  . History of placenta abruption 08/30/2016  . History of pre-eclampsia in prior pregnancy, currently pregnant in second trimester 08/30/2016  . History of 3 cesarean sections 08/30/2016  . Elderly multigravida in second trimester 08/09/2016  . History of gestational hypertension 08/09/2016  . Uterine scar from previous cesarean delivery affecting pregnancy 08/09/2016  . Grand multiparity 08/09/2016  . Current smoker 08/09/2016  . Mild intermittent asthma 07/14/2016  . GERD (gastroesophageal reflux disease) 07/14/2016  . Tobacco use disorder 07/14/2016  . Atypical squamous cells of undetermined significance on cytologic smear of  cervix (ASC-US) 06/21/2016    Past Surgical History:  Procedure Laterality Date  . CESAREAN SECTION    . CESAREAN SECTION N/A 12/21/2016   Procedure: CESAREAN SECTION;  Surgeon: Federico Flake, MD;  Location: [facility] BIRTHING SUITES;  Service: Obstetrics;  Laterality: N/A;    OB History    Gravida Para Term Preterm AB Living   SAB TAB Ectopic Multiple Live Births         0 17       Home Medications    Prior to Admission medications   Medication Sig Start Date End Date Taking? Authorizing Provider  albuterol (PROVENTIL HFA;VENTOLIN HFA) 108 (90 Base) MCG/ACT inhaler Inhale 2 puffs into the lungs every 6 (six) hours as needed for wheezing or shortness of breath. 12/08/16   Reva Bores, MD  budesonide (PULMICORT) 180 MCG/ACT inhaler Inhale 2 puffs into the lungs 2 (two) times daily. Patient not taking: Reported on 12/14/2016 10/31/16   Sharen Counter A, CNM  doxycycline (VIBRAMYCIN) 100 MG capsule Take 1 capsule (100 mg total) by mouth 2 (two) times daily. 04/11/17   Angelina Venard, Tinnie Gens, PA-C  oxyCODONE (OXY IR/ROXICODONE) 5 MG immediate release tablet Take 1 tablet (5 mg total) by mouth every 4 (four) hours as needed (pain scale 4-7). 12/23/16   Jacklyn Shell, CNM    Family History Family History  Problem Relation Age of Onset  . Prostate cancer Father   . Colon cancer Father   . GER disease Brother   . Lung disease Neg Hx  Social History Social History  Substance Use Topics  . Smoking status: Current Some Day Smoker    Packs/day: 0.25    Years: 22.00    Types: Cigarettes    Start date: 08/05/1993  . Smokeless tobacco: Current User     Comment: Peak rate of 1ppd - Quit for 1 year previously  . Alcohol use No     Comment: Occasionally - but none now     Allergies   Cinnamon; Keflex [cephalexin]; and Naproxen   Review of Systems Review of Systems  All other systems reviewed and are negative.    Physical Exam Updated Vital  Signs BP 132/88 (BP Location: Right Arm)   Pulse 83   Temp 98.6 F (37 C) (Oral)   Resp 18   Ht  (1.702 m)   Wt 62.1 kg (137 lb)   LMP 03/28/2017   SpO2 100%   BMI 21.46 kg/m   Physical Exam  Constitutional: She is oriented to person, place, and time. She appears well-developed and well-nourished.  HENT:  Head: Normocephalic and atraumatic.  Eyes: Pupils are equal, round, and reactive to light. Conjunctivae are normal. Right eye exhibits no discharge. Left eye exhibits no discharge. No scleral icterus.  Neck: Normal range of motion. No JVD present. No tracheal deviation present.  Pulmonary/Chest: Effort normal. No stridor.  Musculoskeletal:  2 cm laceration of the dorsal aspect of the right foot, granulation tissue noted, no discharge, no swelling, no warmth or redness.  Tenderness to palpation surrounding wound full active range of motion of the toes-sensation intact.  Neurological: She is alert and oriented to person, place, and time. Coordination normal.  Psychiatric: She has a normal mood and affect. Her behavior is normal. Judgment and thought content normal.  Nursing note and vitals reviewed.    ED Treatments / Results  Labs (all labs ordered are listed, but only abnormal results are displayed) Labs Reviewed - No data to display  EKG  EKG Interpretation None       Radiology No results found.  Procedures Procedures (including critical care time)  Medications Ordered in ED Medications - No data to display   Initial Impression / Assessment and Plan / ED Course  I have reviewed the triage vital signs and the nursing notes.  Pertinent labs & imaging results that were available during my care of the patient were reviewed by me and considered in my medical decision making (see chart for details).      Final Clinical Impressions(s) / ED Diagnoses   Final diagnoses:  Laceration of right foot, initial encounter    41 year old female presents today with  complaints of wound.  She has a laceration that appears to be healing without complication.  Patient had plain films to rule out any embedded foreign bodies.  Patient reports that she cannot stay for any imaging studies.  No signs of tendon involvement, patient will be given a prescription for antibiotics in the event she develops signs of infectious etiology.  She will follow-up as an outpatient with podiatry if she continues to endorse pain.  Patient is given strict return precautions, she verbalized understanding and agreement to today's plan.  New Prescriptions Discharge Medication List as of 04/11/2017  1:34 PM    START taking these medications   Details  doxycycline (VIBRAMYCIN) 100 MG capsule Take 1 capsule (100 mg total) by mouth 2 (two) times daily., Starting Tue 04/11/2017, Print         Jamario Colina, [place], PA-C 04/11/17  2039    Pricilla LovelessGoldston, Scott, MD 04/14/17 1141

## 2017-04-11 NOTE — ED Triage Notes (Signed)
Couple days ago a glass window fell on her right foot and then she stepped on the glass. Pt has a laceration to top of right foot and is tender and sore and states it drains some.

## 2017-04-11 NOTE — ED Notes (Signed)
The patient wants to leave due to needing to be at work at 1400. They do not want to wait on xray but would like referral to foot doctor. Trey PaulaJeff, PA aware. VO to cancel right foot xray, pt will be discharged
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
## 2017-05-28 ENCOUNTER — Emergency Department (HOSPITAL_COMMUNITY): Payer: Self-pay

## 2017-05-28 ENCOUNTER — Emergency Department (HOSPITAL_COMMUNITY)
Admission: EM | Admit: 2017-05-28 | Discharge: 2017-05-28 | Payer: Self-pay | Attending: Emergency Medicine | Admitting: Emergency Medicine

## 2017-05-28 DIAGNOSIS — J45909 Unspecified asthma, uncomplicated: Secondary | ICD-10-CM | POA: Insufficient documentation

## 2017-05-28 DIAGNOSIS — F17210 Nicotine dependence, cigarettes, uncomplicated: Secondary | ICD-10-CM | POA: Insufficient documentation

## 2017-05-28 DIAGNOSIS — F1510 Other stimulant abuse, uncomplicated: Secondary | ICD-10-CM | POA: Insufficient documentation

## 2017-05-28 DIAGNOSIS — M79671 Pain in right foot: Secondary | ICD-10-CM | POA: Insufficient documentation

## 2017-05-28 DIAGNOSIS — G8929 Other chronic pain: Secondary | ICD-10-CM | POA: Insufficient documentation

## 2017-05-28 DIAGNOSIS — Z79899 Other long term (current) drug therapy: Secondary | ICD-10-CM | POA: Insufficient documentation

## 2017-05-28 DIAGNOSIS — R0789 Other chest pain: Secondary | ICD-10-CM | POA: Insufficient documentation

## 2017-05-28 LAB — CBC
HEMATOCRIT: 38.4 % (ref 36.0–46.0)
HEMOGLOBIN: 12.8 g/dL (ref 12.0–15.0)
MCH: 29.2 pg (ref 26.0–34.0)
MCHC: 33.3 g/dL (ref 30.0–36.0)
MCV: 87.5 fL (ref 78.0–100.0)
Platelets: 277 10*3/uL (ref 150–400)
RBC: 4.39 MIL/uL (ref 3.87–5.11)
RDW: 13.9 % (ref 11.5–15.5)
WBC: 8.2 10*3/uL (ref 4.0–10.5)

## 2017-05-28 LAB — I-STAT CHEM 8, ED
BUN: 9 mg/dL (ref 6–20)
CHLORIDE: 107 mmol/L (ref 101–111)
Calcium, Ion: 1.23 mmol/L (ref 1.15–1.40)
Creatinine, Ser: 0.9 mg/dL (ref 0.44–1.00)
Glucose, Bld: 88 mg/dL (ref 65–99)
HEMATOCRIT: 40 % (ref 36.0–46.0)
Hemoglobin: 13.6 g/dL (ref 12.0–15.0)
Potassium: 4.1 mmol/L (ref 3.5–5.1)
Sodium: 141 mmol/L (ref 135–145)
TCO2: 23 mmol/L (ref 22–32)

## 2017-05-28 LAB — D-DIMER, QUANTITATIVE: D-Dimer, Quant: 0.36 ug/mL-FEU (ref 0.00–0.50)

## 2017-05-28 LAB — I-STAT TROPONIN, ED: Troponin i, poc: 0 ng/mL (ref 0.00–0.08)

## 2017-05-28 NOTE — ED Notes (Signed)
Pt stable and left with [employer]

## 2017-05-28 NOTE — Discharge Instructions (Signed)
Ask your primary care physician to help you to stop smoking.  Call any of the numbers on the resource guide to get help with your amphetamine problem.

## 2017-05-28 NOTE — ED Provider Notes (Signed)
[facility] EMERGENCY DEPARTMENT Provider Note   CSN: [id number] Arrival date & time: 05/28/17  1431     History   Chief Complaint Chief Complaint  Patient presents with  . Chest Pain    HPI Tamara Bradford is a 41 y.o. female.  Complains of left anterior chest pain onset 4 AM today pain is intermittent last 2 or 3 seconds at a time worse when she argues or gets aggravated, nonradiating improved when she is calm.  No shortness of breath nausea or sweatiness however she does report that she gets short of breath sometimes when at work.  No shortness of breath presently.  Also admits to slight cough.  No fever.  No other associated symptoms.  Treated with Tylenol without relief  HPI  Past Medical History:  Diagnosis Date  . Asthma   . Depression   . Pregnancy induced hypertension   . Preterm labor     Patient Active Problem List   Diagnosis Date Noted  . Previous cesarean delivery affecting pregnancy 12/21/2016  . Pelvic adhesive disease 11/03/2016  . Traumatic injury during pregnancy in third trimester 10/28/2016  . Supervision of high-risk pregnancy 08/30/2016  . History of placenta abruption 08/30/2016  . History of pre-eclampsia in prior pregnancy, currently pregnant in second trimester 08/30/2016  . History of 3 cesarean sections 08/30/2016  . Elderly multigravida in second trimester 08/09/2016  . History of gestational hypertension 08/09/2016  . Uterine scar from previous cesarean delivery affecting pregnancy 08/09/2016  . Grand multiparity 08/09/2016  . Current smoker 08/09/2016  . Mild intermittent asthma 07/14/2016  . GERD (gastroesophageal reflux disease) 07/14/2016  . Tobacco use disorder 07/14/2016  . Atypical squamous cells of undetermined significance on cytologic smear of cervix (ASC-US) 06/21/2016   Hypercholesterolemia Past Surgical History:  Procedure Laterality Date  . CESAREAN SECTION    . CESAREAN SECTION N/A 12/21/2016   Procedure: CESAREAN SECTION;  Surgeon: Federico Flake, MD;  Location: [facility] BIRTHING SUITES;  Service: Obstetrics;  Laterality: N/A;    OB History    Gravida Para Term Preterm AB Living   17 17 16 1   15    SAB TAB Ectopic Multiple Live Births         0 17       Home Medications    Prior to Admission medications   Medication Sig Start Date End Date Taking? Authorizing Provider  albuterol (PROVENTIL HFA;VENTOLIN HFA) 108 (90 Base) MCG/ACT inhaler Inhale 2 puffs into the lungs every 6 (six) hours as needed for wheezing or shortness of breath. 12/08/16   Reva Bores, MD  budesonide (PULMICORT) 180 MCG/ACT inhaler Inhale 2 puffs into the lungs 2 (two) times daily. Patient not taking: Reported on 12/14/2016 10/31/16   Sharen Counter A, CNM  doxycycline (VIBRAMYCIN) 100 MG capsule Take 1 capsule (100 mg total) by mouth 2 (two) times daily. 04/11/17   Hedges, Tinnie Gens, PA-C  oxyCODONE (OXY IR/ROXICODONE) 5 MG immediate release tablet Take 1 tablet (5 mg total) by mouth every 4 (four) hours as needed (pain scale 4-7). 12/23/16   Jacklyn Shell, CNM    Family History Family History  Problem Relation Age of Onset  . Prostate cancer Father   . Colon cancer Father   . GER disease Brother   . Lung disease Neg Hx    Father had MI age 75 Social History Social History  Substance Use Topics  . Smoking status: Current Some Day Smoker    Packs/day: 0.25  Years: 22.00    Types: Cigarettes    Start date: 08/05/1993  . Smokeless tobacco: Current User     Comment: Peak rate of 1ppd - Quit for 1 year previously  . Alcohol use No     Comment: Occasionally - but none now   Positive amphetamine use.  No history of IV drug use last admits to amphetamines 3 days ago.  Takes amphetamine as pills  Allergies   Cinnamon; Keflex [cephalexin]; and Naproxen   Review of Systems Review of Systems  Cardiovascular: Positive for chest pain.  Genitourinary:       Menses are regular and  last normal menstrual period August 2018  Musculoskeletal: Positive for arthralgias.       Foot pain for 1 month worry glass broke on her foot.  All other systems reviewed and are negative.    Physical Exam Updated Vital Signs BP 134/78 (BP Location: Right Arm)   Pulse (!) 101   Temp 97.7 F (36.5 C) (Oral)   Resp 15   SpO2 99%   Physical Exam  Constitutional: She appears well-developed and well-nourished.  Tearful  HENT:  Head: Normocephalic and atraumatic.  Eyes: Pupils are equal, round, and reactive to light. Conjunctivae are normal.  Neck: Neck supple. No tracheal deviation present. No thyromegaly present.  Cardiovascular: Regular rhythm, normal heart sounds and intact distal pulses.   No murmur heard. Mildly tachycardic  Pulmonary/Chest: Effort normal and breath sounds normal.  Abdominal: Soft. Bowel sounds are normal. She exhibits no distension. There is no tenderness.  Musculoskeletal: Normal range of motion. She exhibits no edema or tenderness.  All 4 extremities without redness finger tenderness neurovascularly intact radial pulses 2+ bilaterally.  Neurological: She is alert. Coordination normal.  Skin: Skin is warm and dry. No rash noted.  Psychiatric: She has a normal mood and affect.  Nursing note and vitals reviewed.    ED Treatments / Results  Labs (all labs ordered are listed, but only abnormal results are displayed) Labs Reviewed  CBC  D-DIMER, QUANTITATIVE (NOT AT [facility])  I-STAT TROPONIN, ED  I-STAT CHEM 8, ED  I-STAT TROPONIN, ED    EKG  EKG Interpretation  Date/Time:  Sunday May 28 2017 14:45:44 EDT Ventricular Rate:  100 PR Interval:    QRS Duration: 84 QT Interval:  357 QTC Calculation: 461 R Axis:   77 Text Interpretation:  Sinus tachycardia Prominent P waves, nondiagnostic No significant change since last tracing Confirmed by Doug SouJacubowitz, Kaelin Holford [phone]([id number]) on 05/28/2017 3:07:11 PM     Chest x-ray reviewed by me Results for orders placed  or performed during the hospital encounter of 05/28/17  CBC  Result Value Ref Range   WBC 8.2 4.0 - 10.5 K/uL   RBC 4.39 3.87 - 5.11 MIL/uL   Hemoglobin 12.8 12.0 - 15.0 g/dL   HCT 57.838.4 46.936.0 - 62.946.0 %   MCV 87.5 78.0 - 100.0 fL   MCH 29.2 26.0 - 34.0 pg   MCHC 33.3 30.0 - 36.0 g/dL   RDW 52.813.9 41.311.5 - 24.415.5 %   Platelets 277 150 - 400 K/uL  D-dimer, quantitative (not at [facility])  Result Value Ref Range   D-Dimer, Quant 0.36 0.00 - 0.50 ug/mL-FEU  I-stat troponin, ED  Result Value Ref Range   Troponin i, poc 0.00 0.00 - 0.08 ng/mL   Comment 3          I-stat chem 8, ed  Result Value Ref Range   Sodium 141 135 - 145 mmol/L  Potassium 4.1 3.5 - 5.1 mmol/L   Chloride 107 101 - 111 mmol/L   BUN 9 6 - 20 mg/dL   Creatinine, Ser 1.61 0.44 - 1.00 mg/dL   Glucose, Bld 88 65 - 99 mg/dL   Calcium, Ion 0.96 0.45 - 1.40 mmol/L   TCO2 23 22 - 32 mmol/L   Hemoglobin 13.6 12.0 - 15.0 g/dL   HCT 40.9 81.1 - 91.4 %   Dg Chest 2 View  Result Date: 05/28/2017 CLINICAL DATA:  Left-sided chest pain. EXAM: CHEST  2 VIEW COMPARISON:  October 28, 2016 FINDINGS: Bilateral nipple shadows are identified. The heart, hila, and mediastinum are normal. No pneumothorax. No suspicious nodules or masses. No focal infiltrates. No acute abnormalities are identified. IMPRESSION: No active cardiopulmonary disease. Electronically Signed   By: Gerome Sinai Mahany III M.D   On: 05/28/2017 15:27   Radiology No results found.  Procedures Procedures (including critical care time)  Medications Ordered in ED Medications - No data to display   Initial Impression / Assessment and Plan / ED Course  I have reviewed the triage vital signs and the nursing notes.  Pertinent labs & imaging results that were available during my care of the patient were reviewed by me and considered in my medical decision making (see chart for details).   5:35 PM patient states "I feel better" low pretest clinical probability for pulmonary embolism  negative d-dimer.  Highly atypical symptoms for acute coronary syndrome.  Negative troponin.  Heart score equals 2.  Strongly doubt aortic dissection.  Highly atypical symptoms.  Symmetric bilateral pulses  I counseled for patient for 5 minutes on smoking cessation.  She will also be given resource guide for substance abuse.  She has an appointment with podiatrist coming up for her foot pain of 1 month.  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

## 2017-05-28 NOTE — ED Notes (Signed)
Pt given a sprite 

## 2017-05-28 NOTE — ED Notes (Signed)
Pt refused to let RN draw blood off of IV

## 2017-09-15 ENCOUNTER — Encounter (HOSPITAL_COMMUNITY): Payer: Self-pay | Admitting: Nurse Practitioner

## 2017-09-15 DIAGNOSIS — O9989 Other specified diseases and conditions complicating pregnancy, childbirth and the puerperium: Secondary | ICD-10-CM | POA: Insufficient documentation

## 2017-09-15 DIAGNOSIS — Z79899 Other long term (current) drug therapy: Secondary | ICD-10-CM | POA: Insufficient documentation

## 2017-09-15 DIAGNOSIS — F17210 Nicotine dependence, cigarettes, uncomplicated: Secondary | ICD-10-CM | POA: Diagnosis not present

## 2017-09-15 DIAGNOSIS — J45909 Unspecified asthma, uncomplicated: Secondary | ICD-10-CM | POA: Diagnosis not present

## 2017-09-15 DIAGNOSIS — Z3A01 Less than 8 weeks gestation of pregnancy: Secondary | ICD-10-CM | POA: Insufficient documentation

## 2017-09-15 DIAGNOSIS — R109 Unspecified abdominal pain: Secondary | ICD-10-CM | POA: Diagnosis present
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
## 2017-09-16 ENCOUNTER — Emergency Department (HOSPITAL_COMMUNITY)
Admission: EM | Admit: 2017-09-16 | Discharge: 2017-09-16 | Payer: Medicaid Other | Attending: Emergency Medicine | Admitting: Emergency Medicine

## 2017-09-16 ENCOUNTER — Emergency Department (HOSPITAL_COMMUNITY): Payer: Medicaid Other

## 2017-09-16 DIAGNOSIS — R109 Unspecified abdominal pain: Secondary | ICD-10-CM

## 2017-09-16 DIAGNOSIS — Z3A01 Less than 8 weeks gestation of pregnancy: Secondary | ICD-10-CM

## 2017-09-16 LAB — CBC
HEMATOCRIT: 38.2 % (ref 36.0–46.0)
Hemoglobin: 12.9 g/dL (ref 12.0–15.0)
MCH: 29.3 pg (ref 26.0–34.0)
MCHC: 33.8 g/dL (ref 30.0–36.0)
MCV: 86.6 fL (ref 78.0–100.0)
Platelets: 256 10*3/uL (ref 150–400)
RBC: 4.41 MIL/uL (ref 3.87–5.11)
RDW: 14 % (ref 11.5–15.5)
WBC: 7.6 10*3/uL (ref 4.0–10.5)

## 2017-09-16 LAB — COMPREHENSIVE METABOLIC PANEL
ALBUMIN: 3.5 g/dL (ref 3.5–5.0)
ALT: 33 U/L (ref 14–54)
AST: 42 U/L — AB (ref 15–41)
Alkaline Phosphatase: 101 U/L (ref 38–126)
Anion gap: 6 (ref 5–15)
BUN: 20 mg/dL (ref 6–20)
CHLORIDE: 107 mmol/L (ref 101–111)
CO2: 22 mmol/L (ref 22–32)
Calcium: 8.6 mg/dL — ABNORMAL LOW (ref 8.9–10.3)
Creatinine, Ser: 0.98 mg/dL (ref 0.44–1.00)
GFR calc Af Amer: >60 mL/min (ref >60)
GFR calc non Af Amer: >60 mL/min (ref >60)
GLUCOSE: 110 mg/dL — AB (ref 65–99)
POTASSIUM: 4 mmol/L (ref 3.5–5.1)
Sodium: 135 mmol/L (ref 135–145)
Total Bilirubin: 0.2 mg/dL — ABNORMAL LOW (ref 0.3–1.2)
Total Protein: 6.7 g/dL (ref 6.5–8.1)

## 2017-09-16 LAB — URINALYSIS, ROUTINE W REFLEX MICROSCOPIC
Bilirubin Urine: NEGATIVE
Glucose, UA: NEGATIVE mg/dL
KETONES UR: 5 mg/dL — AB
Leukocytes, UA: NEGATIVE
Nitrite: NEGATIVE
PROTEIN: 30 mg/dL — AB
Specific Gravity, Urine: 1.021 (ref 1.005–1.030)
pH: 6 (ref 5.0–8.0)

## 2017-09-16 LAB — LIPASE, BLOOD: Lipase: 23 U/L (ref 11–51)

## 2017-09-16 LAB — I-STAT BETA HCG BLOOD, ED (MC, WL, AP ONLY): I-stat hCG, quantitative: >2000 m[IU]/mL — ABNORMAL HIGH (ref <5)

## 2017-09-16 LAB — HCG, QUANTITATIVE, PREGNANCY: hCG, Beta Chain, Quant, S: 46242 m[IU]/mL — ABNORMAL HIGH (ref <5)

## 2017-09-16 NOTE — Discharge Instructions (Signed)
Follow-up with women's clinic. You can return here for any new/acute changes.
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
## 2017-10-02 ENCOUNTER — Emergency Department (HOSPITAL_COMMUNITY)
Admission: EM | Admit: 2017-10-02 | Discharge: 2017-10-03 | Disposition: A | Discharge status: Home / Self Care | Payer: Medicaid Other | Attending: Emergency Medicine | Admitting: Emergency Medicine

## 2017-10-02 ENCOUNTER — Encounter (HOSPITAL_COMMUNITY): Payer: Self-pay | Admitting: Emergency Medicine

## 2017-10-02 ENCOUNTER — Other Ambulatory Visit: Payer: Self-pay

## 2017-10-02 DIAGNOSIS — F430 Acute stress reaction: Secondary | ICD-10-CM | POA: Diagnosis not present

## 2017-10-02 DIAGNOSIS — O99519 Diseases of the respiratory system complicating pregnancy, unspecified trimester: Secondary | ICD-10-CM | POA: Insufficient documentation

## 2017-10-02 DIAGNOSIS — O9989 Other specified diseases and conditions complicating pregnancy, childbirth and the puerperium: Secondary | ICD-10-CM | POA: Diagnosis present

## 2017-10-02 DIAGNOSIS — Z3490 Encounter for supervision of normal pregnancy, unspecified, unspecified trimester: Secondary | ICD-10-CM

## 2017-10-02 DIAGNOSIS — Z3A00 Weeks of gestation of pregnancy not specified: Secondary | ICD-10-CM | POA: Diagnosis not present

## 2017-10-02 DIAGNOSIS — F439 Reaction to severe stress, unspecified: Secondary | ICD-10-CM
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
## 2017-10-02 NOTE — ED Notes (Signed)
Bed: WLPT3 Expected date:  Expected time:  Means of arrival:  Comments: 
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
## 2017-10-02 NOTE — ED Notes (Signed)
Unable to collect labs patient refused.  I made nurse aware 

## 2017-10-02 NOTE — ED Notes (Signed)
ED Provider at bedside. 

## 2017-10-06 ENCOUNTER — Encounter: Payer: Self-pay | Admitting: Medical

## 2017-10-19 ENCOUNTER — Other Ambulatory Visit: Payer: Self-pay

## 2017-10-19 ENCOUNTER — Inpatient Hospital Stay (HOSPITAL_COMMUNITY)
Admission: AD | Admit: 2017-10-19 | Discharge: 2017-10-19 | Disposition: A | Discharge status: Home / Self Care | Payer: Medicaid Other | Source: Ambulatory Visit | Attending: Obstetrics and Gynecology | Admitting: Obstetrics and Gynecology

## 2017-10-19 DIAGNOSIS — R109 Unspecified abdominal pain: Secondary | ICD-10-CM | POA: Diagnosis not present

## 2017-10-19 DIAGNOSIS — O99341 Other mental disorders complicating pregnancy, first trimester: Secondary | ICD-10-CM | POA: Diagnosis not present

## 2017-10-19 DIAGNOSIS — O161 Unspecified maternal hypertension, first trimester: Secondary | ICD-10-CM | POA: Insufficient documentation

## 2017-10-19 DIAGNOSIS — O99511 Diseases of the respiratory system complicating pregnancy, first trimester: Secondary | ICD-10-CM | POA: Diagnosis not present

## 2017-10-19 DIAGNOSIS — Z3491 Encounter for supervision of normal pregnancy, unspecified, first trimester: Secondary | ICD-10-CM

## 2017-10-19 DIAGNOSIS — J45909 Unspecified asthma, uncomplicated: Secondary | ICD-10-CM | POA: Diagnosis not present

## 2017-10-19 DIAGNOSIS — O26891 Other specified pregnancy related conditions, first trimester: Secondary | ICD-10-CM | POA: Diagnosis not present

## 2017-10-19 DIAGNOSIS — Z79899 Other long term (current) drug therapy: Secondary | ICD-10-CM | POA: Insufficient documentation

## 2017-10-19 DIAGNOSIS — F329 Major depressive disorder, single episode, unspecified: Secondary | ICD-10-CM | POA: Insufficient documentation

## 2017-10-19 DIAGNOSIS — Z91018 Allergy to other foods: Secondary | ICD-10-CM | POA: Diagnosis not present

## 2017-10-19 DIAGNOSIS — O34219 Maternal care for unspecified type scar from previous cesarean delivery: Secondary | ICD-10-CM | POA: Diagnosis not present

## 2017-10-19 DIAGNOSIS — O99331 Smoking (tobacco) complicating pregnancy, first trimester: Secondary | ICD-10-CM | POA: Insufficient documentation

## 2017-10-19 DIAGNOSIS — F17210 Nicotine dependence, cigarettes, uncomplicated: Secondary | ICD-10-CM | POA: Insufficient documentation

## 2017-10-19 DIAGNOSIS — Z881 Allergy status to other antibiotic agents status: Secondary | ICD-10-CM | POA: Diagnosis not present

## 2017-10-19 DIAGNOSIS — Z3A10 10 weeks gestation of pregnancy: Secondary | ICD-10-CM | POA: Insufficient documentation

## 2017-10-19 DIAGNOSIS — Z886 Allergy status to analgesic agent status: Secondary | ICD-10-CM | POA: Diagnosis not present

## 2017-10-19 DIAGNOSIS — Z7952 Long term (current) use of systemic steroids: Secondary | ICD-10-CM | POA: Diagnosis not present

## 2017-10-19 DIAGNOSIS — O26851 Spotting complicating pregnancy, first trimester: Secondary | ICD-10-CM

## 2017-10-19 NOTE — MAU Note (Addendum)
Took a preg test about 7 wks ago. Has had spotting. Had a little cramping today.  Concerned about miscarriage.  Reviewed US results from Feb with pt, seemed unsure of this, thought she might be having twins due to wt gain.  Informed pt she is preg with a singleton  Preg. Reassured, states just wanted to know that and needs a preg verification letter.

## 2017-10-19 NOTE — MAU Provider Note (Signed)
preg verifiction Heart tones Spotting 1 week ago, none today  Chief Complaint: Abdominal Pain   None     SUBJECTIVE HPI: Tamara Bradford is a 42 y.o. [id number] at [redacted]w[redacted]d by LMP who presents to maternity admissions reporting she is gaining weight in this pregnancy and was worried this was twins. She denies any pain or bleeding. There are no other associated symptoms. She has hx of high risk pregnancies with HTN, drug abuse, previous C/S x 4, fetal demise and desires prenatal care in [facility] office.   HPI  Past Medical History:  Diagnosis Date  . Asthma   . Depression   . Pregnancy induced hypertension   . Preterm labor    Past Surgical History:  Procedure Laterality Date  . CESAREAN SECTION    . CESAREAN SECTION N/A 12/21/2016   Procedure: CESAREAN SECTION;  Surgeon: Federico Flake, MD;  Location: [facility] BIRTHING SUITES;  Service: Obstetrics;  Laterality: N/A;   Social History   Socioeconomic History  . Marital status: Single    Spouse name: Not on file  . Number of children: Not on file  . Years of education: Not on file  . Highest education level: Not on file  Occupational History  . Not on file  Social Needs  . Financial resource strain: Not on file  . Food insecurity:    Worry: Not on file    Inability: Not on file  . Transportation needs:    Medical: Not on file    Non-medical: Not on file  Tobacco Use  . Smoking status: Current Some Day Smoker    Packs/day: 0.25    Years: 22.00    Pack years: 5.50    Types: Cigarettes    Start date: 08/05/1993  . Smokeless tobacco: Current User  . Tobacco comment: Peak rate of 1ppd - Quit for 1 year previously  Substance and Sexual Activity  . Alcohol use: No    Comment: Occasionally - but none now  . Drug use: No    Types: Methamphetamines  . Sexual activity: Yes  Lifestyle  . Physical activity:    Days per week: Not on file    Minutes per session: Not on file  . Stress: Not on file  Relationships  . Social  connections:    Talks on phone: Not on file    Gets together: Not on file    Attends religious service: Not on file    Active member of club or organization: Not on file    Attends meetings of clubs or organizations: Not on file    Relationship status: Not on file  . Intimate partner violence:    Fear of current or ex partner: Not on file    Emotionally abused: Not on file    Physically abused: Not on file    Forced sexual activity: Not on file  Other Topics Concern  . Not on file  Social History Narrative   [facility]:   Originally from [place]. Moved to [facility] in October 2017. 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 No pets currently. Previous home had mold (white mold). No bird or hot tub exposure. Currently staying motel with mold under bed.    No current facility-administered medications on file prior to encounter.    Current Outpatient Medications on File Prior to Encounter  Medication Sig Dispense Refill  . albuterol (PROVENTIL HFA;VENTOLIN HFA) 108 (90 Base) MCG/ACT inhaler  Inhale 2 puffs into the lungs every 6 (six) hours as needed for wheezing or shortness of breath. 1 Inhaler 2  . predniSONE (DELTASONE) 50 MG tablet Take 25 mg by mouth daily with breakfast.     Allergies  Allergen Reactions  . Cinnamon Hives  . Keflex [Cephalexin] Hives  . Naproxen Rash    ROS:  Review of Systems  Constitutional: Negative for chills, fatigue and fever.  Respiratory: Negative for shortness of breath.   Cardiovascular: Negative for chest pain.  Gastrointestinal: Negative for nausea and vomiting.  Genitourinary: Negative for difficulty urinating, dysuria, flank pain, pelvic pain, vaginal bleeding, vaginal discharge and vaginal pain.  Neurological: Negative for dizziness and headaches.  Psychiatric/Behavioral: Negative.      I have reviewed patient's Past Medical Hx, Surgical Hx, Family Hx, Social Hx, medications  and allergies.   Physical Exam   Patient Vitals for the past 24 hrs:  BP Temp Temp src Pulse Resp SpO2 Weight  10/19/17 1237 120/75 98.7 F (37.1 C) Oral 90 16 100 % 150 lb 12 oz (68.4 kg)   Constitutional: Well-developed, well-nourished female in no acute distress.  Cardiovascular: normal rate Respiratory: normal effort GI: Abd soft, non-tender. Pos BS x 4 MS: Extremities nontender, no edema, normal ROM Neurologic: Alert and oriented x 4.  GU: Neg CVAT.   FHT 159 by doppler  LAB RESULTS No results found for this or any previous visit (from the past 24 hour(s)).  --/--/O POS (05/22 1225)  IMAGING No results found.  MAU Management/MDM: Pt with no acute symptoms requiring management today. No vaginal bleeding or abdominal pain. No vaginal spotting x 1 week. Reviewed pt US on 09/16/17 showing single live IUP.  FHT present and wnl today by doppler.  Pregnancy verification letter provided. [facility] to establish high risk pregnancy care due to pt hx.  Return precautions reviewed.  ASSESSMENT 1. Normal IUP (intrauterine pregnancy) on prenatal ultrasound, first trimester   2. Spotting affecting pregnancy in first trimester     PLAN Discharge home  Allergies as of 10/19/2017      Reactions   Cinnamon Hives   Keflex [cephalexin] Hives   Naproxen Rash       Follow-up Information    Prenatal provider of your choice Follow up.   Why:  See list provided. Return to MAU as needed for emergencies.          Sharen CounterLisa Leftwich-Kirby Certified Nurse-Midwife 10/19/2017  1:29 PM

## 2017-10-19 NOTE — Discharge Instructions (Signed)
[facility] Prov[facility]  ° ° °[facility]       Phone: [phone] ° °[facility] Phone: [phone] ° °[facility] [place]  Phone: [phone] ° °[facility] [place]  Phone: [phone] ° °[facility] [place]  Phone: [phone] ° °[facility]       Phone: [phone] ° °[facility]    Phone: [phone]  ° °[facility] ([place])    Phone: [phone] ° °[facility]    Phone: [phone] ° °[facility]    Phone: [phone] ° °[facility]    Phone: [phone] ° °[facility]       Phone: [phone]  ° °[facility]  Phone: [phone] ° °[facility]    Phone: [phone] ° °[facility]    Phone: [phone] ° °[facility]      Phone: [phone] ° °[facility]    Phone: [phone] ° °

## 2017-10-30 ENCOUNTER — Encounter: Payer: Self-pay | Admitting: Family Medicine

## 2017-11-28 ENCOUNTER — Encounter: Payer: Self-pay | Admitting: Obstetrics

## 2017-11-30 ENCOUNTER — Telehealth: Payer: Self-pay | Admitting: *Deleted

## 2017-11-30 NOTE — Telephone Encounter (Signed)
Attempted to call patient to reschedule New Ob or see if she still plans to do care here no answer at both numbers listed and unable to leave vmail.Marland KitchenMarland Kitchen

## 2018-04-10 ENCOUNTER — Inpatient Hospital Stay (HOSPITAL_COMMUNITY)
Admission: AD | Admit: 2018-04-10 | Discharge: 2018-04-10 | Disposition: A | Discharge status: Home / Self Care | Payer: Medicaid Other | Source: Ambulatory Visit | Attending: Family Medicine | Admitting: Family Medicine

## 2018-04-10 ENCOUNTER — Other Ambulatory Visit: Payer: Self-pay

## 2018-04-10 ENCOUNTER — Encounter (HOSPITAL_COMMUNITY): Payer: Self-pay

## 2018-04-10 DIAGNOSIS — O99333 Smoking (tobacco) complicating pregnancy, third trimester: Secondary | ICD-10-CM | POA: Diagnosis not present

## 2018-04-10 DIAGNOSIS — Z881 Allergy status to other antibiotic agents status: Secondary | ICD-10-CM | POA: Insufficient documentation

## 2018-04-10 DIAGNOSIS — O09523 Supervision of elderly multigravida, third trimester: Secondary | ICD-10-CM | POA: Diagnosis present

## 2018-04-10 DIAGNOSIS — O4703 False labor before 37 completed weeks of gestation, third trimester: Secondary | ICD-10-CM | POA: Diagnosis not present

## 2018-04-10 DIAGNOSIS — O99513 Diseases of the respiratory system complicating pregnancy, third trimester: Secondary | ICD-10-CM | POA: Diagnosis not present

## 2018-04-10 DIAGNOSIS — F329 Major depressive disorder, single episode, unspecified: Secondary | ICD-10-CM | POA: Insufficient documentation

## 2018-04-10 DIAGNOSIS — Z0130 Encounter for examination of blood pressure without abnormal findings: Secondary | ICD-10-CM

## 2018-04-10 DIAGNOSIS — J45909 Unspecified asthma, uncomplicated: Secondary | ICD-10-CM | POA: Insufficient documentation

## 2018-04-10 DIAGNOSIS — F17210 Nicotine dependence, cigarettes, uncomplicated: Secondary | ICD-10-CM | POA: Insufficient documentation

## 2018-04-10 DIAGNOSIS — Z79899 Other long term (current) drug therapy: Secondary | ICD-10-CM | POA: Insufficient documentation

## 2018-04-10 DIAGNOSIS — Z3A35 35 weeks gestation of pregnancy: Secondary | ICD-10-CM | POA: Insufficient documentation

## 2018-04-10 DIAGNOSIS — O99343 Other mental disorders complicating pregnancy, third trimester: Secondary | ICD-10-CM | POA: Insufficient documentation

## 2018-04-10 DIAGNOSIS — Z886 Allergy status to analgesic agent status: Secondary | ICD-10-CM | POA: Diagnosis not present

## 2018-04-10 DIAGNOSIS — O09213 Supervision of pregnancy with history of pre-term labor, third trimester: Secondary | ICD-10-CM | POA: Insufficient documentation

## 2018-04-10 LAB — URINALYSIS, ROUTINE W REFLEX MICROSCOPIC
Bilirubin Urine: NEGATIVE
Glucose, UA: NEGATIVE mg/dL
Hgb urine dipstick: NEGATIVE
Ketones, ur: NEGATIVE mg/dL
LEUKOCYTES UA: NEGATIVE
Nitrite: NEGATIVE
PH: 7 (ref 5.0–8.0)
Protein, ur: NEGATIVE mg/dL
SPECIFIC GRAVITY, URINE: 1.01 (ref 1.005–1.030)

## 2018-04-10 LAB — OB RESULTS CONSOLE ANTIBODY SCREEN: Antibody Screen: NEGATIVE

## 2018-04-10 LAB — OB RESULTS CONSOLE ABO/RH: RH Type: POSITIVE

## 2018-04-10 LAB — OB RESULTS CONSOLE HIV ANTIBODY (ROUTINE TESTING): HIV: NONREACTIVE

## 2018-04-10 LAB — OB RESULTS CONSOLE HEPATITIS B SURFACE ANTIGEN: Hepatitis B Surface Ag: NEGATIVE

## 2018-04-10 LAB — HIV ANTIBODY (ROUTINE TESTING W REFLEX): HIV 1&2 Ab, 4th Generation: NONREACTIVE

## 2018-04-10 MED ORDER — ALBUTEROL SULFATE (2.5 MG/3ML) 0.083% IN NEBU
2.5000 mg | INHALATION_SOLUTION | Freq: Once | RESPIRATORY_TRACT | Status: AC
  Administered 2018-04-10: 2.5 mg via RESPIRATORY_TRACT
  Filled 2018-04-10: qty 3

## 2018-04-10 NOTE — MAU Note (Signed)
Pt sitting up on edge of bed vomiting into trash can. [place] monitor tracing maternal HR.

## 2018-04-10 NOTE — MAU Provider Note (Signed)
First Provider Initiated Contact with Patient 04/10/18 2051      Chief Complaint:  Contractions   Tamara Bradford is  42 y.o. [id number] at [redacted]w[redacted]d presents complaining of Contractions .She was in [place] a few days ago and went to  Hospital, telling providers that she was 39+ weeks pregnant. [place] at that facility gave a GA of 36.3, and MD acknowledged that this could be c/w a 39+ week pregnancy/smaller baby. She had borderline BPs at that time, and pr/cr ratio was 0.3. MD at that facility had recommended delivery d/t pt being 39+ weeks w/elevated BP/proteinuria.    However, a 6 week [place] in Epic shows pt is 35.3 weeks. Her BPs here are normal, protein normal.    Obstetrical/Gynecological History: OB History    Gravida  18   Para  17   Term  16   Preterm  1   AB      Living  15     SAB      TAB      Ectopic      Multiple  0   Live Births  85          Past Medical History: Past Medical History:  Diagnosis Date  . Asthma   . Depression   . Pregnancy induced hypertension   . Preterm labor     Past Surgical History: Past Surgical History:  Procedure Laterality Date  . CESAREAN SECTION    . CESAREAN SECTION N/A 12/21/2016   Procedure: CESAREAN SECTION;  Surgeon: Federico Flake, MD;  Location: [facility];  Service: Obstetrics;  Laterality: N/A;    Family History: Family History  Problem Relation Age of Onset  . Prostate cancer Father   . Colon cancer Father   . GER disease Brother   . Lung disease Neg Hx     Social History: Social History   Tobacco Use  . Smoking status: Current Some Day Smoker    Packs/day: 0.25    Years: 22.00    Pack years: 5.50    Types: Cigarettes    Start date: 08/05/1993  . Smokeless tobacco: Current User  . Tobacco comment: Peak rate of 1ppd - Quit for 1 year previously  Substance Use Topics  . Alcohol use: No    Comment: Occasionally - but none now  . Drug use: No    Types: Methamphetamines    Allergies:   Allergies  Allergen Reactions  . Cinnamon Hives  . Keflex [Cephalexin] Hives  . Naproxen Rash    Meds:  Medications Prior to Admission  Medication Sig Dispense Refill Last Dose  . albuterol (PROVENTIL HFA;VENTOLIN HFA) 108 (90 Base) MCG/ACT inhaler Inhale 2 puffs into the lungs every 6 (six) hours as needed for wheezing or shortness of breath. 1 Inhaler 2 04/10/2018 at Unknown time  . Prenatal Vit-Fe Fumarate-FA (PRENATAL MULTIVITAMIN) TABS tablet Take 1 tablet by mouth daily at 12 noon.   04/09/2018 at Unknown time  . predniSONE (DELTASONE) 50 MG tablet Take 25 mg by mouth daily with breakfast.   not needed    Review of Systems   Constitutional: Negative for fever and chills Eyes: Negative for visual disturbances Respiratory: Negative for shortness of breath, dyspnea Cardiovascular: Negative for chest pain or palpitations  Gastrointestinal: Negative for vomiting, diarrhea and constipation Genitourinary: Negative for dysuria and urgency Musculoskeletal: Negative for joint pain, myalgias.  Normal ROM  Neurological: Negative for dizziness and headaches    Physical Exam  Blood pressure 130/85,  pulse (!) 121, temperature 98.6 F (37 C), temperature source Oral, resp. rate 18, unknown if currently breastfeeding.    GENERAL: Well-developed, well-nourished female in no acute distress.  LUNGS: Clear to auscultation bilaterally.  HEART: Regular rate and rhythm. ABDOMEN: Soft, nontender, nondistended, gravid.  EXTREMITIES: Nontender, no edema, 2+ distal pulses. DTR's 2+ FHT:  Baseline rate 145 bpm   Variability moderate  Accelerations present   Decelerations none Contractions: Every 0 mins   Labs: No results found for this or any previous visit (from the past 24 hour(s)). Imaging Studies:  No results found.  Assessment: Tamara Bradford is  42 y.o. [id number] at [redacted]w[redacted]d presents with no evidence of GHTN or preeclampsia EDC based on 6 week [place] No indication for preterm  delivery.  Plan: Message sent to get pt appt at [facility] within a week for [facility], as she doesn't plan on going back to [place].  Records from [place] ED visit sent to clinic  Jacklyn Shell 9/10/20199:01 PM

## 2018-04-10 NOTE — Discharge Instructions (Signed)
Hypertension During Pregnancy °Hypertension, commonly called high blood pressure, is when the force of blood pumping through your arteries is too strong. Arteries are blood vessels that carry blood from the heart throughout the body. Hypertension during pregnancy can cause problems for you and your baby. Your baby may be born early (prematurely) or may not weigh as much as he or she should at birth. Very bad cases of hypertension during pregnancy can be life-threatening. °Different types of hypertension can occur during pregnancy. These include: °· Chronic hypertension. This happens when: °? You have hypertension before pregnancy and it continues during pregnancy. °? You develop hypertension before you are [redacted] weeks pregnant, and it continues during pregnancy. °· Gestational hypertension. This is hypertension that develops after the 20th week of pregnancy. °· Preeclampsia, also called toxemia of pregnancy. This is a very serious type of hypertension that develops only during pregnancy. It affects the whole body, and it can be very dangerous for you and your baby. ° °Gestational hypertension and preeclampsia usually go away within 6 weeks after your baby is born. Women who have hypertension during pregnancy have a greater chance of developing hypertension later in life or during future pregnancies. °What are the causes? °The exact cause of hypertension is not known. °What increases the risk? °There are certain factors that make it more likely for you to develop hypertension during pregnancy. These include: °· Having hypertension during a previous pregnancy or prior to pregnancy. °· Being overweight. °· Being older than age 40. °· Being pregnant for the first time or being pregnant with more than one baby. °· Becoming pregnant using fertilization methods such as IVF (in vitro fertilization). °· Having diabetes, kidney problems, or systemic lupus erythematosus. °· Having a family history of hypertension. ° °What are the  signs or symptoms? °Chronic hypertension and gestational hypertension rarely cause symptoms. Preeclampsia causes symptoms, which may include: °· Increased protein in your urine. Your health care provider will check for this at every visit before you give birth (prenatal visit). °· Severe headaches. °· Sudden weight gain. °· Swelling of the hands, face, legs, and feet. °· Nausea and vomiting. °· Vision problems, such as blurred or double vision. °· Numbness in the face, arms, legs, and feet. °· Dizziness. °· Slurred speech. °· Sensitivity to bright lights. °· Abdominal pain. °· Convulsions. ° °How is this diagnosed? °You may be diagnosed with hypertension during a routine prenatal exam. At each prenatal visit, you may: °· Have a urine test to check for high amounts of protein in your urine. °· Have your blood pressure checked. A blood pressure reading is recorded as two numbers, such as "120 over 80" (or 120/80). The first ("top") number is called the systolic pressure. It is a measure of the pressure in your arteries when your heart beats. The second ("bottom") number is called the diastolic pressure. It is a measure of the pressure in your arteries as your heart relaxes between beats. Blood pressure is measured in a unit called mm Hg. A normal blood pressure reading is: °? Systolic: below 120. °? Diastolic: below 80. ° °The type of hypertension that you are diagnosed with depends on your test results and when your symptoms developed. °· Chronic hypertension is usually diagnosed before 20 weeks of pregnancy. °· Gestational hypertension is usually diagnosed after 20 weeks of pregnancy. °· Hypertension with high amounts of protein in the urine is diagnosed as preeclampsia. °· Blood pressure measurements that stay above 160 systolic, or above 110 diastolic, are   signs of severe preeclampsia. ° °How is this treated? °Treatment for hypertension during pregnancy varies depending on the type of hypertension you have and how  serious it is. °· If you take medicines called ACE inhibitors to treat chronic hypertension, you may need to switch medicines. ACE inhibitors should not be taken during pregnancy. °· If you have gestational hypertension, you may need to take blood pressure medicine. °· If you are at risk for preeclampsia, your health care provider may recommend that you take a low-dose aspirin every day to prevent high blood pressure during your pregnancy. °· If you have severe preeclampsia, you may need to be hospitalized so you and your baby can be monitored closely. You may also need to take medicine (magnesium sulfate) to prevent seizures and to lower blood pressure. This medicine may be given as an injection or through an IV tube. °· In some cases, if your condition gets worse, you may need to deliver your baby early. ° °Follow these instructions at home: °Eating and drinking °· Drink enough fluid to keep your urine clear or pale yellow. °· Eat a healthy diet that is low in salt (sodium). Do not add salt to your food. Check food labels to see how much sodium a food or beverage contains. °Lifestyle °· Do not use any products that contain nicotine or tobacco, such as cigarettes and e-cigarettes. If you need help quitting, ask your health care provider. °· Do not use alcohol. °· Avoid caffeine. °· Avoid stress as much as possible. Rest and get plenty of sleep. °General instructions °· Take over-the-counter and prescription medicines only as told by your health care provider. °· While lying down, lie on your left side. This keeps pressure off your baby. °· While sitting or lying down, raise (elevate) your feet. Try putting some pillows under your lower legs. °· Exercise regularly. Ask your health care provider what kinds of exercise are best for you. °· Keep all prenatal and follow-up visits as told by your health care provider. This is important. °Contact a health care provider if: °· You have symptoms that your health care  provider told you may require more treatment or monitoring, such as: °? Fever. °? Vomiting. °? Headache. °Get help right away if: °· You have severe abdominal pain or vomiting that does not get better with treatment. °· You suddenly develop swelling in your hands, ankles, or face. °· You gain 4 lbs (1.8 kg) or more in 1 week. °· You develop vaginal bleeding, or you have blood in your urine. °· You do not feel your baby moving as much as usual. °· You have blurred or double vision. °· You have muscle twitching or sudden tightening (spasms). °· You have shortness of breath. °· Your lips or fingernails turn blue. °This information is not intended to replace advice given to you by your health care provider. Make sure you discuss any questions you have with your health care provider. °Document Released: 04/05/2011 Document Revised: 02/05/2016 Document Reviewed: 01/01/2016 °Elsevier Interactive Patient Education © 2018 Elsevier Inc. ° °
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
## 2018-04-10 NOTE — MAU Note (Signed)
Pt stated she is having ctx 3-4 an hour since yesterday at 5:30. Also c/o feeling dizzy  Pt reports Denies any vaginal bleeding or leaking and reports good fetal movement.. Got some prenatal care in [place].Stated she was sent from  [facility] (in [place] ) to have a c-section. Asked if they had contacted [place] and I said they had not. Tried to clarify why she was sent from [place] to have a c-section in [place] and pt asked to speak to supervisor. Would not finish triage part of visit. Waiting for [facility] supervisor to talk to patient.

## 2018-04-10 NOTE — MAU Note (Signed)
Records release faxed to [facility] in [place].

## 2018-04-10 NOTE — MAU Note (Signed)
Pt traveling from [place], states seen at hospital in [place] and was told BP was increased and needed to be seen once arriving in [place] for delivery. Concerned about swelling in bilateral feet and contractions while traveling. Husband reporting stopping to check BP at pharmacy on the way and it was high. Burry vision, SOB, decrease FM, and vomiting x1 day, denied any epigastric pain.

## 2018-04-10 NOTE — MAU Note (Signed)
RT notified of new order for nebulizer treatment. To come to unit to administer, pt aware.

## 2018-04-16 ENCOUNTER — Telehealth: Payer: Self-pay | Admitting: Family Medicine

## 2018-04-16 NOTE — Telephone Encounter (Signed)
Patient called to confirm her appointment for Wednesday.

## 2018-04-16 NOTE — Telephone Encounter (Signed)
Called patient about her upcoming appointment. Had to leave a voicemail.

## 2018-04-18 ENCOUNTER — Encounter: Payer: Self-pay | Admitting: Family Medicine

## 2018-04-18 ENCOUNTER — Encounter (HOSPITAL_COMMUNITY): Payer: Self-pay

## 2018-04-18 ENCOUNTER — Ambulatory Visit (INDEPENDENT_AMBULATORY_CARE_PROVIDER_SITE_OTHER): Payer: Medicaid Other | Admitting: Family Medicine

## 2018-04-18 VITALS — BP 141/83 | HR 115 | Wt 163.2 lb

## 2018-04-18 DIAGNOSIS — F17200 Nicotine dependence, unspecified, uncomplicated: Secondary | ICD-10-CM | POA: Diagnosis not present

## 2018-04-18 DIAGNOSIS — Z23 Encounter for immunization: Secondary | ICD-10-CM

## 2018-04-18 DIAGNOSIS — O0993 Supervision of high risk pregnancy, unspecified, third trimester: Secondary | ICD-10-CM | POA: Insufficient documentation

## 2018-04-18 DIAGNOSIS — J4521 Mild intermittent asthma with (acute) exacerbation: Secondary | ICD-10-CM | POA: Diagnosis not present

## 2018-04-18 DIAGNOSIS — O139 Gestational [pregnancy-induced] hypertension without significant proteinuria, unspecified trimester: Secondary | ICD-10-CM | POA: Diagnosis not present

## 2018-04-18 DIAGNOSIS — O34219 Maternal care for unspecified type scar from previous cesarean delivery: Secondary | ICD-10-CM

## 2018-04-18 DIAGNOSIS — Z98891 History of uterine scar from previous surgery: Secondary | ICD-10-CM | POA: Diagnosis not present

## 2018-04-18 DIAGNOSIS — T7491XS Unspecified adult maltreatment, confirmed, sequela: Secondary | ICD-10-CM

## 2018-04-18 DIAGNOSIS — O09292 Supervision of pregnancy with other poor reproductive or obstetric history, second trimester: Secondary | ICD-10-CM | POA: Diagnosis not present

## 2018-04-18 DIAGNOSIS — F1510 Other stimulant abuse, uncomplicated: Secondary | ICD-10-CM

## 2018-04-18 DIAGNOSIS — T7491XA Unspecified adult maltreatment, confirmed, initial encounter: Secondary | ICD-10-CM | POA: Insufficient documentation

## 2018-04-18 LAB — POCT URINALYSIS DIP (DEVICE)
Bilirubin Urine: NEGATIVE
Glucose, UA: NEGATIVE mg/dL
Hgb urine dipstick: NEGATIVE
Ketones, ur: NEGATIVE mg/dL
Leukocytes, UA: NEGATIVE
NITRITE: NEGATIVE
PH: 6 (ref 5.0–8.0)
Protein, ur: NEGATIVE mg/dL
Specific Gravity, Urine: 1.015 (ref 1.005–1.030)
Urobilinogen, UA: 1 mg/dL (ref 0.0–1.0)

## 2018-04-18 MED ORDER — ALBUTEROL SULFATE HFA 108 (90 BASE) MCG/ACT IN AERS: 2.0000 | INHALATION_SPRAY | Freq: Four times a day (QID) | RESPIRATORY_TRACT | 3 refills | Status: DC | PRN

## 2018-04-18 MED ORDER — PRENATAL VITAMINS 0.8 MG PO TABS: 1.0000 | ORAL_TABLET | Freq: Every day | ORAL | 6 refills | Status: DC

## 2018-04-18 NOTE — BH Specialist Note (Deleted)
Integrated Behavioral Health Initial Visit  MRN: [id number] Name: Tamara Bradford  Number of Integrated Behavioral Health Clinician visits:: {IBH Number of Visits:21014052} Session Start time: ***  Session End time: *** Total time: {IBH Total Time:21014050}  Type of Service: Integrated Behavioral Health- Individual/Family Interpretor:{yes [id number]}no:[id number]} Interpretor Name and Language: ***   Warm Hand Off Completed.       SUBJECTIVE: Tamara Bradford is a 42 y.o. female accompanied by {CHL AMB ACCOMPANIED OJ:5009381829}BY:5192371189} Patient was referred by *** for ***. Patient reports the following symptoms/concerns: *** Duration of problem: ***; Severity of problem: {Mild/Moderate/Severe:20260}  OBJECTIVE: Mood: {BHH MOOD:22306} and Affect: {BHH AFFECT:22307} Risk of harm to self or others: {CHL AMB BH Suicide Current Mental Status:21022748}  LIFE CONTEXT: Family and Social: *** School/Work: *** Self-Care: *** Life Changes: ***  GOALS ADDRESSED: Patient will: 1. Reduce symptoms of: {IBH Symptoms:21014056} 2. Increase knowledge and/or ability of: {IBH Patient Tools:21014057}  3. Demonstrate ability to: {IBH Goals:21014053}  INTERVENTIONS: Interventions utilized: {IBH Interventions:21014054}  Standardized Assessments completed: {IBH Screening Tools:21014051}  ASSESSMENT: Patient currently experiencing ***.   Patient may benefit from ***.  PLAN: 1. Follow up with behavioral health clinician on : *** 2. Behavioral recommendations: *** 3. Referral(s): {IBH Referrals:21014055} 4. "From scale of 1-10, how likely are you to follow plan?": ***  Valetta CloseJamie C Bayley [place], LCSW

## 2018-04-18 NOTE — Patient Instructions (Signed)
Tamara Bradford  04/18/2018   Your procedure is scheduled on:  04/21/2018  [facility] through [facility] 0745 AM.  Pick up the phone at the desk and dial [phone]  Call this number if you have problems the morning of surgery:[phone]  Remember:   Do not eat food:(After Midnight) Desps de medianoche.  Do not drink clear liquids: (After Midnight) Desps de medianoche.  Take these medicines the morning of surgery with A SIP OF WATER: none   Do not wear jewelry, make-up or nail polish.  Do not wear lotions, powders, or perfumes. Do not wear deodorant.  Do not shave 48 hours prior to surgery.  Do not bring valuables to the hospital.  [facility] is not   responsible for any belongings or valuables brought to the hospital.  Contacts, dentures or bridgework may not be worn into surgery.  Leave suitcase in the car. After surgery it may be brought to your room.  For patients admitted to the hospital, checkout time is 11:00 AM the day of              discharge.    N/A   Please read over the following fact sheets that you were given:   Surgical Site Infection Prevention
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
## 2018-04-18 NOTE — Progress Notes (Signed)
Pt concerned of dimples in legs & feet, she states they hurt real bad. Pt states has been having heartburn & vomiting.

## 2018-04-18 NOTE — Patient Instructions (Signed)
Your C-section is scheduled on Saturday, September 18 at 9:30am. Please arrive at 7am to have your labs drawn and get checked in.

## 2018-04-19 ENCOUNTER — Encounter (HOSPITAL_COMMUNITY)
Admission: RE | Admit: 2018-04-19 | Discharge: 2018-04-19 | Disposition: A | Discharge status: Home / Self Care | Payer: Medicaid Other | Source: Ambulatory Visit

## 2018-04-19 ENCOUNTER — Encounter (HOSPITAL_COMMUNITY): Payer: Self-pay

## 2018-04-19 HISTORY — DX: Supervision of pregnancy with grand multiparity, third trimester: O09.43

## 2018-04-19 HISTORY — DX: Anemia, unspecified: D64.9

## 2018-04-19 HISTORY — DX: Other stimulant abuse, uncomplicated: F15.10

## 2018-04-19 HISTORY — DX: Supervision of pregnancy with other poor reproductive or obstetric history, unspecified trimester: O09.299

## 2018-04-19 HISTORY — DX: Supervision of elderly multigravida, third trimester: O09.523

## 2018-04-20 ENCOUNTER — Encounter (HOSPITAL_COMMUNITY): Admission: RE | Admit: 2018-04-20 | Payer: Medicaid Other | Source: Ambulatory Visit

## 2018-04-20 ENCOUNTER — Telehealth (HOSPITAL_COMMUNITY): Payer: Self-pay | Admitting: *Deleted

## 2018-04-20 DIAGNOSIS — F1510 Other stimulant abuse, uncomplicated: Secondary | ICD-10-CM | POA: Insufficient documentation

## 2018-04-20 NOTE — Telephone Encounter (Signed)
Voicemail left with NPO and arrival instructions of 0700 9/21.  No show for PAT on 9/19 and 9/20

## 2018-04-21 ENCOUNTER — Inpatient Hospital Stay (HOSPITAL_COMMUNITY): Payer: Medicaid Other | Admitting: Anesthesiology

## 2018-04-21 ENCOUNTER — Encounter (HOSPITAL_COMMUNITY)
Admission: RE | Disposition: A | Discharge status: Other Acute Inpatient Hospital | Payer: Self-pay | Source: Home / Self Care | Attending: Obstetrics & Gynecology

## 2018-04-21 ENCOUNTER — Inpatient Hospital Stay (HOSPITAL_COMMUNITY): Payer: Medicaid Other

## 2018-04-21 ENCOUNTER — Encounter (HOSPITAL_COMMUNITY): Payer: Self-pay | Admitting: *Deleted

## 2018-04-21 ENCOUNTER — Inpatient Hospital Stay (HOSPITAL_COMMUNITY)
Admission: RE | Admit: 2018-04-21 | Discharge: 2018-04-21 | DRG: 832 | Disposition: A | Discharge status: Other Acute Inpatient Hospital | Payer: Medicaid Other | Attending: Obstetrics & Gynecology | Admitting: Obstetrics & Gynecology

## 2018-04-21 DIAGNOSIS — J4520 Mild intermittent asthma, uncomplicated: Secondary | ICD-10-CM | POA: Diagnosis present

## 2018-04-21 DIAGNOSIS — F17200 Nicotine dependence, unspecified, uncomplicated: Secondary | ICD-10-CM | POA: Diagnosis present

## 2018-04-21 DIAGNOSIS — O99320 Drug use complicating pregnancy, unspecified trimester: Secondary | ICD-10-CM

## 2018-04-21 DIAGNOSIS — O133 Gestational [pregnancy-induced] hypertension without significant proteinuria, third trimester: Principal | ICD-10-CM | POA: Diagnosis present

## 2018-04-21 DIAGNOSIS — O34219 Maternal care for unspecified type scar from previous cesarean delivery: Secondary | ICD-10-CM | POA: Diagnosis present

## 2018-04-21 DIAGNOSIS — Z3A37 37 weeks gestation of pregnancy: Secondary | ICD-10-CM | POA: Diagnosis not present

## 2018-04-21 DIAGNOSIS — F1520 Other stimulant dependence, uncomplicated: Secondary | ICD-10-CM | POA: Diagnosis present

## 2018-04-21 DIAGNOSIS — O99333 Smoking (tobacco) complicating pregnancy, third trimester: Secondary | ICD-10-CM | POA: Diagnosis present

## 2018-04-21 DIAGNOSIS — O99323 Drug use complicating pregnancy, third trimester: Secondary | ICD-10-CM | POA: Diagnosis present

## 2018-04-21 DIAGNOSIS — R062 Wheezing: Secondary | ICD-10-CM

## 2018-04-21 DIAGNOSIS — F1020 Alcohol dependence, uncomplicated: Secondary | ICD-10-CM | POA: Diagnosis present

## 2018-04-21 DIAGNOSIS — F17210 Nicotine dependence, cigarettes, uncomplicated: Secondary | ICD-10-CM | POA: Diagnosis present

## 2018-04-21 DIAGNOSIS — O99513 Diseases of the respiratory system complicating pregnancy, third trimester: Secondary | ICD-10-CM | POA: Diagnosis present

## 2018-04-21 DIAGNOSIS — O99313 Alcohol use complicating pregnancy, third trimester: Secondary | ICD-10-CM | POA: Diagnosis present

## 2018-04-21 DIAGNOSIS — O09529 Supervision of elderly multigravida, unspecified trimester: Secondary | ICD-10-CM | POA: Diagnosis present

## 2018-04-21 DIAGNOSIS — F1510 Other stimulant abuse, uncomplicated: Secondary | ICD-10-CM | POA: Diagnosis present

## 2018-04-21 DIAGNOSIS — N736 Female pelvic peritoneal adhesions (postinfective): Secondary | ICD-10-CM | POA: Diagnosis present

## 2018-04-21 DIAGNOSIS — F1911 Other psychoactive substance abuse, in remission: Secondary | ICD-10-CM | POA: Diagnosis present

## 2018-04-21 HISTORY — DX: Gestational (pregnancy-induced) hypertension without significant proteinuria, unspecified trimester: O13.9

## 2018-04-21 HISTORY — DX: Premature separation of placenta, unspecified, unspecified trimester: O45.90

## 2018-04-21 HISTORY — DX: Female pelvic peritoneal adhesions (postinfective): N73.6

## 2018-04-21 HISTORY — DX: Unspecified adult maltreatment, confirmed, initial encounter: T74.91XA

## 2018-04-21 HISTORY — DX: Tobacco use: Z72.0

## 2018-04-21 LAB — DIFFERENTIAL
BASOS PCT: 0 %
Basophils Absolute: 0 10*3/uL (ref 0.0–0.1)
Eosinophils Absolute: 0.4 10*3/uL (ref 0.0–0.7)
Eosinophils Relative: 6 %
LYMPHS PCT: 27 %
Lymphs Abs: 1.9 10*3/uL (ref 0.7–4.0)
MONO ABS: 0.4 10*3/uL (ref 0.1–1.0)
MONOS PCT: 6 %
NEUTROS ABS: 4.2 10*3/uL (ref 1.7–7.7)
Neutrophils Relative %: 61 %

## 2018-04-21 LAB — CBC
HCT: 29.4 % — ABNORMAL LOW (ref 36.0–46.0)
Hemoglobin: 9.6 g/dL — ABNORMAL LOW (ref 12.0–15.0)
MCH: 28.3 pg (ref 26.0–34.0)
MCHC: 32.7 g/dL (ref 30.0–36.0)
MCV: 86.7 fL (ref 78.0–100.0)
PLATELETS: 235 10*3/uL (ref 150–400)
RBC: 3.39 MIL/uL — AB (ref 3.87–5.11)
RDW: 13.6 % (ref 11.5–15.5)
WBC: 6.9 10*3/uL (ref 4.0–10.5)

## 2018-04-21 LAB — PROTEIN / CREATININE RATIO, URINE: CREATININE, URINE: 59 mg/dL

## 2018-04-21 LAB — RAPID URINE DRUG SCREEN, HOSP PERFORMED
Amphetamines: POSITIVE — AB
BARBITURATES: NOT DETECTED
BENZODIAZEPINES: NOT DETECTED
Cocaine: NOT DETECTED
Opiates: NOT DETECTED
Tetrahydrocannabinol: NOT DETECTED

## 2018-04-21 LAB — COMPREHENSIVE METABOLIC PANEL
ALK PHOS: 274 U/L — AB (ref 38–126)
ALT: 12 U/L (ref 0–44)
ANION GAP: 9 (ref 5–15)
AST: 30 U/L (ref 15–41)
Albumin: 2.6 g/dL — ABNORMAL LOW (ref 3.5–5.0)
BUN: 12 mg/dL (ref 6–20)
CALCIUM: 8.5 mg/dL — AB (ref 8.9–10.3)
CO2: 19 mmol/L — ABNORMAL LOW (ref 22–32)
Chloride: 106 mmol/L (ref 98–111)
Creatinine, Ser: 0.86 mg/dL (ref 0.44–1.00)
GFR calc non Af Amer: >60 mL/min (ref >60)
Glucose, Bld: 120 mg/dL — ABNORMAL HIGH (ref 70–99)
Potassium: 3.8 mmol/L (ref 3.5–5.1)
SODIUM: 134 mmol/L — AB (ref 135–145)
TOTAL PROTEIN: 6.7 g/dL (ref 6.5–8.1)
Total Bilirubin: 0.8 mg/dL (ref 0.3–1.2)

## 2018-04-21 LAB — PREPARE RBC (CROSSMATCH)

## 2018-04-21 SURGERY — Surgical Case
Anesthesia: Spinal

## 2018-04-21 MED ORDER — THIAMINE HCL 100 MG/ML IJ SOLN
100.0000 mg | Freq: Every day | INTRAMUSCULAR | Status: DC
  Administered 2018-04-21: 100 mg via INTRAVENOUS
  Filled 2018-04-21 (×2): qty 1

## 2018-04-21 MED ORDER — FENTANYL CITRATE (PF) 100 MCG/2ML IJ SOLN
INTRAMUSCULAR | Status: AC
  Filled 2018-04-21: qty 2

## 2018-04-21 MED ORDER — ALBUTEROL SULFATE (2.5 MG/3ML) 0.083% IN NEBU: 3.0000 mL | INHALATION_SOLUTION | RESPIRATORY_TRACT | Status: DC | PRN

## 2018-04-21 MED ORDER — ADULT MULTIVITAMIN W/MINERALS CH
1.0000 | ORAL_TABLET | Freq: Every day | ORAL | Status: DC
  Filled 2018-04-21 (×2): qty 1

## 2018-04-21 MED ORDER — MORPHINE SULFATE (PF) 0.5 MG/ML IJ SOLN
INTRAMUSCULAR | Status: AC
  Filled 2018-04-21: qty 10

## 2018-04-21 MED ORDER — LORAZEPAM 2 MG/ML IJ SOLN: 1.0000 mg | Freq: Once | INTRAMUSCULAR | Status: DC

## 2018-04-21 MED ORDER — TRANEXAMIC ACID 1000 MG/10ML IV SOLN
1000.0000 mg | INTRAVENOUS | Status: DC
  Filled 2018-04-21: qty 10

## 2018-04-21 MED ORDER — SODIUM CHLORIDE 0.9 % IV SOLN
500.0000 mg | Freq: Once | INTRAVENOUS | Status: AC
  Administered 2018-04-21: 500 mg via INTRAVENOUS
  Filled 2018-04-21: qty 500

## 2018-04-21 MED ORDER — ONDANSETRON HCL 4 MG/2ML IJ SOLN
4.0000 mg | Freq: Once | INTRAMUSCULAR | Status: AC
  Administered 2018-04-21: 4 mg via INTRAVENOUS
  Filled 2018-04-21: qty 2

## 2018-04-21 MED ORDER — VITAMIN B-1 100 MG PO TABS
100.0000 mg | ORAL_TABLET | Freq: Every day | ORAL | Status: DC
  Filled 2018-04-21 (×2): qty 1

## 2018-04-21 MED ORDER — DEXTROSE 5 % IV SOLN: 1000.0000 mg | Freq: Once | INTRAVENOUS | Status: DC

## 2018-04-21 MED ORDER — LORAZEPAM 2 MG/ML IJ SOLN: 1.0000 mg | Freq: Four times a day (QID) | INTRAMUSCULAR | Status: DC | PRN

## 2018-04-21 MED ORDER — LORAZEPAM 2 MG/ML IJ SOLN
0.0000 mg | Freq: Four times a day (QID) | INTRAMUSCULAR | Status: DC
  Administered 2018-04-21: 2 mg via INTRAVENOUS

## 2018-04-21 MED ORDER — IPRATROPIUM-ALBUTEROL 0.5-2.5 (3) MG/3ML IN SOLN
3.0000 mL | RESPIRATORY_TRACT | Status: DC | PRN
  Administered 2018-04-21: 3 mL via RESPIRATORY_TRACT
  Filled 2018-04-21 (×2): qty 3

## 2018-04-21 MED ORDER — LACTATED RINGERS IV SOLN
INTRAVENOUS | Status: DC
  Administered 2018-04-21 (×2): via INTRAVENOUS

## 2018-04-21 MED ORDER — LORAZEPAM 2 MG/ML IJ SOLN: 0.0000 mg | Freq: Two times a day (BID) | INTRAMUSCULAR | Status: DC

## 2018-04-21 MED ORDER — SUGAMMADEX SODIUM 200 MG/2ML IV SOLN
INTRAVENOUS | Status: AC
  Filled 2018-04-21: qty 2

## 2018-04-21 MED ORDER — GENTAMICIN SULFATE 40 MG/ML IJ SOLN
INTRAVENOUS | Status: DC
  Filled 2018-04-21: qty 9.25

## 2018-04-21 MED ORDER — FOLIC ACID 1 MG PO TABS
1.0000 mg | ORAL_TABLET | Freq: Every day | ORAL | Status: DC
  Filled 2018-04-21 (×2): qty 1

## 2018-04-21 MED ORDER — FAMOTIDINE IN NACL 20-0.9 MG/50ML-% IV SOLN
20.0000 mg | Freq: Once | INTRAVENOUS | Status: AC
  Administered 2018-04-21: 20 mg via INTRAVENOUS
  Filled 2018-04-21: qty 50

## 2018-04-21 MED ORDER — LORAZEPAM 2 MG/ML IJ SOLN
2.0000 mg | Freq: Once | INTRAMUSCULAR | Status: AC
  Administered 2018-04-21: 2 mg via INTRAVENOUS

## 2018-04-21 MED ORDER — LACTATED RINGERS IV SOLN
INTRAVENOUS | Status: DC
  Administered 2018-04-21 (×2): via INTRAVENOUS

## 2018-04-21 MED ORDER — LORAZEPAM 0.5 MG PO TABS: 1.0000 mg | ORAL_TABLET | Freq: Four times a day (QID) | ORAL | Status: DC | PRN

## 2018-04-21 MED ORDER — SOD CITRATE-CITRIC ACID 500-334 MG/5ML PO SOLN
30.0000 mL | ORAL | Status: AC
  Administered 2018-04-21: 30 mL via ORAL
  Filled 2018-04-21: qty 15

## 2018-04-21 NOTE — Progress Notes (Signed)
[facility]

## 2018-04-21 NOTE — Anesthesia Preprocedure Evaluation (Addendum)
Anesthesia Evaluation  Patient identified by MRN, date of birth, ID band Patient awake    Reviewed: Allergy & Precautions, NPO status , Patient's Chart, lab work & pertinent test results  Airway Mallampati: II  TM Distance: >3 FB Neck ROM: Full    Dental no notable dental hx.    Pulmonary asthma , Current Smoker,    Pulmonary exam normal breath sounds clear to auscultation       Cardiovascular hypertension, Normal cardiovascular exam Rhythm:Regular Rate:Normal     Neuro/Psych negative neurological ROS  negative psych ROS   GI/Hepatic negative GI ROS, Neg liver ROS,   Endo/Other  negative endocrine ROS  Renal/GU negative Renal ROS     Musculoskeletal negative musculoskeletal ROS (+)   Abdominal   Peds  Hematology  (+) anemia ,   Anesthesia Other Findings grandmultip  Reproductive/Obstetrics negative OB ROS                             Anesthesia Physical  Anesthesia Plan  ASA: III  Anesthesia Plan: Spinal   Post-op Pain Management:    Induction:   PONV Risk Score and Plan: 3 and Ondansetron, Dexamethasone, Scopolamine patch - Pre-op and Treatment may vary due to age or medical condition  Airway Management Planned: Natural Airway  Additional Equipment:   Intra-op Plan:   Post-operative Plan:   Informed Consent: I have reviewed the patients History and Physical, chart, labs and discussed the procedure including the risks, benefits and alternatives for the proposed anesthesia with the patient or authorized representative who has indicated his/her understanding and acceptance.   Dental advisory given  Plan Discussed with: CRNA  Anesthesia Plan Comments:        Anesthesia Quick Evaluation

## 2018-04-21 NOTE — Progress Notes (Signed)
Anesthesia Evaluation Tamara Bradford presented this AM for scheduled C/S at 37 weeks for presumed preeclampsia (neg for urine protein). She has a significant history that is incredibly complicated. Regardless, upon arrival this morning she appeared either acutely intoxicated with cocaine and/or amphetamines and/or in withdrawal. She has been vomiting this AM as well. Though she initially denied it, she eventually admitted she had eaten some around 7 am, and that she used EtOH and amphetamines last night.  During our discussion with the patient she had audible wheezes when speaking, appeared out of breath, agitated, and tremorous. I did not do a thorough exam as she was being delayed for NPO guidelines regardless, but I did listen to her lungs. She had inspiratory and expiratory wheezes, and some rhonchi bilaterally. She was also tachycardic. I recommended CXR, EtOH withdrawal protocol, that she be given albuterol inhaler, and said that we would reassess later. Her initial CIWA was 17 and she was given ativan. I returned later to evaluate her, but I could not arouse her, and she was very agitated moving about in bed and moaning. I spoke briefly with her friend at bedside. I stated that [name] was in no shape to have elective surgery today. She responded, "she's not going to want to wait a day. She wants to have this baby and get back to [place]." I spoke to nursing staff and Dr. Macon LargeAnyanwu about this. I stated that she may need ICU admission soon.  Dr. Macon LargeAnyanwu the spoke length about this patient and her potentially tumultuous perioperative course. My concerns are many given the vast unknowns in her case. The baby, at this time, appears to be stable by external fetal monitoring.   First, although we do not have a toxicology screen to know what she currently has in her system, [name] is clearly a chronic abuser of at least EtOH and amphetamines. 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 This is particularly pertinent given her history of hemorrhage with prior c/s. She is also anemic and malnourished (Hg 9.6, albumin 2.6). She is also at risk for seizures and aspiration. In her current state, a c/s under a spinal anesthetic would be very difficult if not impossible.   All together with her poor prenatal care, prior hemorrhage with C/S's, acute and chronic drug abuse, significant wheezing, poor nutritional status, anemia, and terrible social setting, I feel strongly she would be better suited at a higher level center.

## 2018-04-21 NOTE — Progress Notes (Signed)
Patient's family/support notified RN that patient had eaten breakfast of doughnut at 0700, as well as used methamphetamine and alcohol yesterday. Patient initially denied then confessed to these facts.  Dr Macon LargeAnyanwu, Merrilyn Pumaana Wrinkle, and Dr Renold DonGermeroth in to see patient and discuss/decide plan of care. Placed patient on external fetal heart monitor per Dr Macon LargeAnyanwu. Arieonna Medine PollockBrown Gennett Garcia, [place]RN 04/21/2018 9:33 AM
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
## 2018-04-21 NOTE — Progress Notes (Signed)
MD Anyanwu notified of CIWA score of 17. 2mg  Ativan administered IV.  Nima Bamburg LisbonBrown Veleria Barnhardt, [place]RN  04/21/2018 11:15 AM

## 2018-04-21 NOTE — Progress Notes (Signed)
Faculty Practice OB/GYN Attending Note  Subjective:  Patient has been evaluated by Anesthesia staff and we have observed her closely. She has required Ativan 2 mg so far, and on evaluation is very agitated. She had also vomited about 6 times since admission, and seems to be acutely withdrawing. CIWA score was 17, concerned about possibly being in 20s with her acute agitation.     Objective:  Patient Vitals for the past 24 hrs:  BP Temp Pulse Resp SpO2 Height Weight  04/21/18 1400 117/71 - (!) 115 - - - -  04/21/18 1035 130/84 - 93 - - - -  04/21/18 1021 - - - - 97 % - -  04/21/18 0846 - - - - 100 % - -  04/21/18 0824 115/75 (!) 97.5 F (36.4 C) (!) 111 18 - 5\' 7"  (1.702 m) 73.9 kg   FHT  Baseline 130 bpm, moderate variability, +accelerations, no decelerations Toco: rare contractions Gen: Agitated then somnolent, unable to communicate well Lungs: Normal respiratory effort but still wheezing Heart: Tachycardic Abdomen: NT, gravid fundus, soft Cervix: Deferred Ext: 2+ DTRs, 1-2+ edema, no cyanosis, negative Homan's sign  Labs CBC Latest Ref Rng & Units 04/21/2018 09/16/2017 05/28/2017  WBC 4.0 - 10.5 K/uL 6.9 7.6 -  Hemoglobin 12.0 - 15.0 g/dL 5.4(U9.6(L) 98.112.9 19.113.6  Hematocrit 36.0 - 46.0 % 29.4(L) 38.2 40.0  Platelets 150 - 400 K/uL 235 256 -   CMP Latest Ref Rng & Units 04/21/2018 09/16/2017 05/28/2017  Glucose 70 - 99 mg/dL 478(G120(H) 956(O110(H) 88  BUN 6 - 20 mg/dL 12 20 9   Creatinine 0.44 - 1.00 mg/dL 1.300.86 8.650.98 7.840.90  Sodium 135 - 145 mmol/L 134(L) 135 141  Potassium 3.5 - 5.1 mmol/L 3.8 4.0 4.1  Chloride 98 - 111 mmol/L 106 107 107  CO2 22 - 32 mmol/L 19(L) 22 -  Calcium 8.9 - 10.3 mg/dL 6.9(G8.5(L) 2.9(B8.6(L) -  Total Protein 6.5 - 8.1 g/dL 6.7 6.7 -  Total Bilirubin 0.3 - 1.2 mg/dL 0.8 2.8(U0.2(L) -  Alkaline Phos 38 - 126 U/L 274(H) 101 -  AST 15 - 41 U/L 30 42(H) -  ALT 0 - 44 U/L 12 33 -    Imaging Dg Chest Port 1 View  Result Date: 04/21/2018 CLINICAL DATA:  Wheezing.  Patient is in  labor. EXAM: PORTABLE CHEST 1 VIEW COMPARISON:  05/28/2017 FINDINGS: The heart size and mediastinal contours are within normal limits. Both lungs are clear. The visualized skeletal structures are unremarkable. IMPRESSION: Normal exam. Electronically Signed   By: Francene BoyersJames  Maxwell M.D.   On: 04/21/2018 10:33    Assessment & Plan:  42 y.o. [id number] at 6531w0d admitted for [facility]; patient is acutely withdrawing.  She was evaluated by our Anesthesiologist (Dr. Renold DonGermeroth), he does not feel she is stable to undergo surgery at this point and feels she may need ICU care after surgery, which [facility] have [facility].  He is not comfortable proceeding with surgery unless there was an emergent maternal-fetal concern; and there currently is not an emergency.   Patient was unable to give a urine sample for testing; but freely admits to acute methamphetamine and alcohol use as recent as last night.  After further discussion, the decision was made that the patient will need to be transferred to a tertiary center with ready-access to other specialty services and ICU care. Please refer to Dr. Orlando PennerGermeroth's note or further details.   Will discuss this with patient and  family, and proceed with transfer to [facility] or [place].      Jaynie Collins, MD, FACOG Obstetrician & Gynecologist, [facility] for [employer], [facility]

## 2018-04-21 NOTE — Discharge Summary (Signed)
Physician Transfer Summary  Patient ID: Tamara Bradford MRN: [id number] DOB/AGE: 04-28-1976 42 y.o.  Admit date: 04/21/2018 Discharge date: 04/21/2018  Admission and Transfer :Diagnoses:  Principal Problem:   Gestational hypertension at 63w0dActive Problems:   Mild intermittent asthma   Tobacco use disorder   Elderly multigravida in second trimester   Pelvic adhesive disease   History of 4 Prior Cesearean Sections - last 2018    Methamphetamine use ([place]   Substance abuse affecting pregnancy, antepartum   Alcohol use disorder, severe, dependence ([place]  Discharged Condition: fair  Hospital Course: [facility]  She reported eating this morning, surgery was delayed. Also noted to intoxicated/withdrawing for substance abuse, she admitted methamphetamine and alcohol use daily, as recent as last night. UDS done today was positive for amphetamines as per her report.  She was observed, noted to be increasingly agitated.  CIWA scores at high as 17, Ativan given as per protocol. Category I FHR tracing, no preeclampsia or severe features.  Also noted to be wheezing, got a few breathing treatments, negative CXR. Our Anesthesiologist was concerned about her being stable enough to undergo major surgery here, with no ICU here and possible need for intensive care due to her cardiopulmonary status, and her substance withdrawal state. The recommendation was made for transfer to tertiary institution; [facility]  We are grateful for his acceptance of this patient.   Consults: Anesthesiology  Significant Diagnostic Studies:  Results for orders placed or performed during the hospital encounter of 04/21/18 (from the past 48 hour(s))  Type and screen WPrincess Anne    Status: None (Preliminary result)   Collection Time: 04/21/18  8:25 AM  Result Value Ref Range   ABO/RH(D) O POS    Antibody Screen  NEG    Sample Expiration      04/24/2018 [facility]formed [facility] [address] [place] [address]   Unit Number [id number]   Blood Component Type RED CELLS,LR    Unit division 00    Status of Unit ALLOCATED    Transfusion Status OK TO TRANSFUSE    Crossmatch Result Compatible    Unit Number [id number]   Blood Component Type RED CELLS,LR    Unit division 00    Status of Unit ALLOCATED    Transfusion Status OK TO TRANSFUSE    Crossmatch Result Compatible   Comprehensive metabolic panel     Status: Abnormal   Collection Time: 04/21/18  8:25 AM  Result Value Ref Range   Sodium 134 (L) 135 - 145 mmol/L   Potassium 3.8 3.5 - 5.1 mmol/L   Chloride 106 98 - 111 mmol/L   CO2 19 (L) 22 - 32 mmol/L   Glucose, Bld 120 (H) 70 - 99 mg/dL   BUN 12 6 - 20 mg/dL   Creatinine, Ser 0.86 0.44 - 1.00 mg/dL   Calcium 8.5 (L) 8.9 - 10.3 mg/dL   Total Protein 6.7 6.5 - 8.1 g/dL   Albumin 2.6 (L) 3.5 - 5.0 g/dL   AST 30 15 - 41 U/L   ALT 12 0 - 44 U/L   Alkaline Phosphatase 274 (H) 38 - 126 U/L   Total Bilirubin 0.8 0.3 - 1.2 mg/dL   GFR calc non Af Amer >60 >60 mL/min   GFR calc Af Amer >60 >60 mL/min    Comment: (NOTE) The eGFR has been  calculated using the CKD EPI equation. This calculation has not been validated in all clinical situations. eGFR's persistently <60 mL/min signify possible Chronic Kidney Disease.    Anion gap 9 5 - 15    Comment: Performed at [facility], [address] [place], [place] [address]  CBC     Status: Abnormal   Collection Time: 04/21/18  8:25 AM  Result Value Ref Range   WBC 6.9 4.0 - 10.5 K/uL   RBC 3.39 (L) 3.87 - 5.11 MIL/uL   Hemoglobin 9.6 (L) 12.0 - 15.0 g/dL   HCT 29.4 (L) 36.0 - 46.0 %   MCV 86.7 78.0 - 100.0 fL   MCH 28.3 26.0 - 34.0 pg   MCHC 32.7 30.0 - 36.0 g/dL   RDW 13.6 11.5 - 15.5 %   Platelets 235 150 - 400 K/uL    Comment: Performed at [facility], [address] [place], [place] [address]   Differential     Status: None   Collection Time: 04/21/18  8:25 AM  Result Value Ref Range   Neutrophils Relative % 61 %   Neutro Abs 4.2 1.7 - 7.7 K/uL   Lymphocytes Relative 27 %   Lymphs Abs 1.9 0.7 - 4.0 K/uL   Monocytes Relative 6 %   Monocytes Absolute 0.4 0.1 - 1.0 K/uL   Eosinophils Relative 6 %   Eosinophils Absolute 0.4 0.0 - 0.7 K/uL   Basophils Relative 0 %   Basophils Absolute 0.0 0.0 - 0.1 K/uL    Comment: Performed at [facility], [address], [place], [place] [address]  Prepare RBC     Status: None   Collection Time: 04/21/18  8:55 AM  Result Value Ref Range   Order Confirmation      ORDER PROCESSED BY BLOOD BANK Performed at [facility], [address], [place], [place] [address]   Rapid urine drug screen (hospital performed)     Status: Abnormal   Collection Time: 04/21/18  3:21 PM  Result Value Ref Range   Opiates NONE DETECTED NONE DETECTED   Cocaine NONE DETECTED NONE DETECTED   Benzodiazepines NONE DETECTED NONE DETECTED   Amphetamines POSITIVE (A) NONE DETECTED   Tetrahydrocannabinol NONE DETECTED NONE DETECTED   Barbiturates NONE DETECTED NONE DETECTED    Comment: (NOTE) DRUG SCREEN FOR MEDICAL PURPOSES ONLY.  IF CONFIRMATION IS NEEDED FOR ANY PURPOSE, NOTIFY LAB WITHIN 5 DAYS. LOWEST DETECTABLE LIMITS FOR URINE DRUG SCREEN Drug Class                     Cutoff (ng/mL) Amphetamine and metabolites    1000 Barbiturate and metabolites    200 Benzodiazepine                 030 Tricyclics and metabolites     300 Opiates and metabolites        300 Cocaine and metabolites        300 THC                            50 Performed at [facility], [address], [place], [place] [address]    Urine Protein/Creatinine: pending   Dg Chest Port 1 View  Result Date: 04/21/2018 CLINICAL DATA:  Wheezing.  Patient is in labor. EXAM: PORTABLE CHEST 1 VIEW COMPARISON:  05/28/2017 FINDINGS: The heart size and mediastinal contours are within  normal limits. Both lungs are clear. The visualized skeletal structures  are unremarkable. IMPRESSION: Normal exam. Electronically Signed   By: Lorriane Shire M.D.   On: 04/21/2018 10:33   '  Discharge disposition: Finger Not Defined   Allergies as of 04/21/2018      Reactions   Cinnamon Hives   Keflex [cephalexin] Hives   Naproxen Rash      Medication List    TAKE these medications   albuterol 108 (90 Base) MCG/ACT inhaler Commonly known as:  PROVENTIL HFA;VENTOLIN HFA Inhale 2 puffs into the lungs every 6 (six) hours as needed for wheezing or shortness of breath.   Prenatal Vitamins 0.8 MG tablet Take 1 tablet by mouth daily.        Signed: Verita Schneiders, MD 04/21/2018, 3:57 PM

## 2018-04-21 NOTE — Progress Notes (Signed)
FHR dopplered: 125-142. 04/21/2018 Tamara Bradford Lynder [name] Dierks Wach, RN 8:44 AM

## 2018-04-21 NOTE — Progress Notes (Signed)
[facility] notified and [facility]

## 2018-04-21 NOTE — Progress Notes (Signed)
[facility]  Accepting OB/GYN: Dr. Eliane DecreeWilliam Bradford  Patient still has not been able to give a urine sample, still somnolent at this point. Will try to get a UDS and Urine PR/CR prior to transfer.   Tamara CollinsUGONNA  Tamara Peterka, MD, FACOG Obstetrician & Gynecologist, [facility], [facility]

## 2018-04-21 NOTE — Progress Notes (Signed)
[facility] notified and report given to Corie ChiquitoMabel Cameron, RN.

## 2018-04-21 NOTE — Progress Notes (Signed)
Urine sample finally obtained, will run STAT toxicology screen  Awaiting [facility].

## 2018-04-21 NOTE — Progress Notes (Signed)
Dr. Macon LargeAnyanwu in to speak with patient regarding surgery and plan.  Keni Wafer HebronBrown Eliseo Withers, [place] 04/21/2018 1:50 PM

## 2018-04-22 DIAGNOSIS — O139 Gestational [pregnancy-induced] hypertension without significant proteinuria, unspecified trimester: Secondary | ICD-10-CM

## 2018-04-22 DIAGNOSIS — F1990 Other psychoactive substance use, unspecified, uncomplicated: Secondary | ICD-10-CM

## 2018-04-22 DIAGNOSIS — Z9189 Other specified personal risk factors, not elsewhere classified: Secondary | ICD-10-CM | POA: Insufficient documentation

## 2018-04-22 DIAGNOSIS — J45909 Unspecified asthma, uncomplicated: Secondary | ICD-10-CM | POA: Insufficient documentation

## 2018-04-22 DIAGNOSIS — D649 Anemia, unspecified: Secondary | ICD-10-CM | POA: Insufficient documentation

## 2018-04-22 LAB — HIV ANTIBODY (ROUTINE TESTING W REFLEX): HIV SCREEN 4TH GENERATION: NONREACTIVE

## 2018-04-22 MED ORDER — ALBUTEROL SULFATE (2.5 MG/3ML) 0.083% IN NEBU
2.50 | INHALATION_SOLUTION | RESPIRATORY_TRACT | Status: DC
Start: 2018-04-23 — End: 2018-04-22

## 2018-04-22 MED ORDER — LORAZEPAM 1 MG PO TABS
2.00 | ORAL_TABLET | ORAL | Status: DC
Start: ? — End: 2018-04-22

## 2018-04-22 MED ORDER — LACTATED RINGERS IV SOLN
500.00 | INTRAVENOUS | Status: DC
Start: ? — End: 2018-04-22

## 2018-04-22 MED ORDER — SODIUM CHLORIDE FLUSH 0.9 % IV SOLN
10.00 | INTRAVENOUS | Status: DC
Start: ? — End: 2018-04-22

## 2018-04-22 MED ORDER — THIAMINE HCL 100 MG/ML IJ SOLN
100.00 | INTRAMUSCULAR | Status: DC
Start: 2018-04-23 — End: 2018-04-22

## 2018-04-22 MED ORDER — GENERIC EXTERNAL MEDICATION
1.00 | Status: DC
Start: 2018-04-23 — End: 2018-04-22

## 2018-04-22 MED ORDER — SODIUM CHLORIDE FLUSH 0.9 % IV SOLN
10.00 | INTRAVENOUS | Status: DC
Start: 2018-04-22 — End: 2018-04-22

## 2018-04-22 MED ORDER — LACTATED RINGERS IV SOLN
INTRAVENOUS | Status: DC
Start: ? — End: 2018-04-22

## 2018-04-23 ENCOUNTER — Encounter (HOSPITAL_COMMUNITY): Payer: Self-pay | Admitting: *Deleted

## 2018-04-23 LAB — RUBELLA SCREEN: RUBELLA: 14.8 {index} (ref >0.99)

## 2018-04-23 LAB — RPR: RPR Ser Ql: NONREACTIVE

## 2018-04-23 MED ORDER — FOLIC ACID 1 MG PO TABS
1.00 | ORAL_TABLET | ORAL | Status: DC
Start: 2018-04-25 — End: 2018-04-23

## 2018-04-23 MED ORDER — BENZOCAINE-MENTHOL 15-3.6 MG MT LOZG
1.00 | LOZENGE | OROMUCOSAL | Status: DC
Start: ? — End: 2018-04-23

## 2018-04-23 MED ORDER — THIAMINE HCL 100 MG PO TABS
100.00 | ORAL_TABLET | ORAL | Status: DC
Start: 2018-04-25 — End: 2018-04-23

## 2018-04-23 MED ORDER — ALUMINUM-MAGNESIUM-SIMETHICONE 200-200-20 MG/5ML PO SUSP
30.00 | ORAL | Status: DC
Start: ? — End: 2018-04-23

## 2018-04-23 MED ORDER — SIMETHICONE 80 MG PO CHEW
80.00 | CHEWABLE_TABLET | ORAL | Status: DC
Start: ? — End: 2018-04-23

## 2018-04-23 MED ORDER — LACTATED RINGERS IV SOLN
INTRAVENOUS | Status: DC
Start: ? — End: 2018-04-23

## 2018-04-23 MED ORDER — BENZOCAINE-MENTHOL 20-0.5 % EX AERO
1.00 | INHALATION_SPRAY | CUTANEOUS | Status: DC
Start: ? — End: 2018-04-23

## 2018-04-23 MED ORDER — IBUPROFEN 400 MG PO TABS
800.00 | ORAL_TABLET | ORAL | Status: DC
Start: 2018-04-23 — End: 2018-04-23

## 2018-04-23 MED ORDER — GENERIC EXTERNAL MEDICATION
5.00 | Status: DC
Start: ? — End: 2018-04-23

## 2018-04-23 MED ORDER — MAGNESIUM HYDROXIDE 400 MG/5ML PO SUSP
30.00 | ORAL | Status: DC
Start: ? — End: 2018-04-23

## 2018-04-23 MED ORDER — DOCUSATE SODIUM 100 MG PO CAPS
100.00 | ORAL_CAPSULE | ORAL | Status: DC
Start: ? — End: 2018-04-23

## 2018-04-23 MED ORDER — VARICELLA VIRUS VACCINE LIVE 1350 PFU/0.5ML ~~LOC~~ INJ
0.50 | INJECTION | SUBCUTANEOUS | Status: DC
Start: ? — End: 2018-04-23

## 2018-04-23 MED ORDER — MORPHINE SULFATE 2 MG/ML IJ SOLN
2.00 | INTRAMUSCULAR | Status: DC
Start: ? — End: 2018-04-23

## 2018-04-23 MED ORDER — THERA PO TABS
1.00 | ORAL_TABLET | ORAL | Status: DC
Start: 2018-04-25 — End: 2018-04-23

## 2018-04-23 MED ORDER — BUDESONIDE 180 MCG/ACT IN AEPB
2.00 | INHALATION_SPRAY | RESPIRATORY_TRACT | Status: DC
Start: 2018-04-24 — End: 2018-04-23

## 2018-04-23 MED ORDER — OXYCODONE HCL 5 MG PO TABS
5.00 | ORAL_TABLET | ORAL | Status: DC
Start: ? — End: 2018-04-23

## 2018-04-23 MED ORDER — SALINE NASAL SPRAY 0.65 % NA SOLN
2.00 | NASAL | Status: DC
Start: ? — End: 2018-04-23

## 2018-04-23 MED ORDER — BISACODYL 10 MG RE SUPP
10.00 | RECTAL | Status: DC
Start: ? — End: 2018-04-23

## 2018-04-23 MED ORDER — WITCH HAZEL-GLYCERIN EX PADS
1.00 | MEDICATED_PAD | CUTANEOUS | Status: DC
Start: ? — End: 2018-04-23

## 2018-04-23 MED ORDER — ENEMA 7-19 GM/118ML RE ENEM
1.00 | ENEMA | RECTAL | Status: DC
Start: ? — End: 2018-04-23

## 2018-04-23 MED ORDER — HYDROCODONE-ACETAMINOPHEN 5-325 MG PO TABS
2.00 | ORAL_TABLET | ORAL | Status: DC
Start: ? — End: 2018-04-23

## 2018-04-23 MED ORDER — NALOXONE HCL 0.4 MG/ML IJ SOLN
0.10 | INTRAMUSCULAR | Status: DC
Start: ? — End: 2018-04-23

## 2018-04-23 MED ORDER — HYDROCODONE-ACETAMINOPHEN 5-325 MG PO TABS
1.00 | ORAL_TABLET | ORAL | Status: DC
Start: ? — End: 2018-04-23

## 2018-04-23 MED ORDER — ACETAMINOPHEN 325 MG PO TABS
650.00 | ORAL_TABLET | ORAL | Status: DC
Start: ? — End: 2018-04-23

## 2018-04-23 MED ORDER — TETANUS-DIPHTH-ACELL PERTUSSIS 5-2.5-18.5 LF-MCG/0.5 IM SUSP
0.50 | INTRAMUSCULAR | Status: DC
Start: ? — End: 2018-04-23

## 2018-04-23 MED ORDER — HYDROMORPHONE HCL 1 MG/ML IJ SOLN
1.00 | INTRAMUSCULAR | Status: DC
Start: ? — End: 2018-04-23

## 2018-04-23 MED ORDER — TRAMADOL HCL 50 MG PO TABS
100.00 | ORAL_TABLET | ORAL | Status: DC
Start: ? — End: 2018-04-23

## 2018-04-23 MED ORDER — GUAIFENESIN 100 MG/5ML PO SYRP
200.00 | ORAL_SOLUTION | ORAL | Status: DC
Start: ? — End: 2018-04-23

## 2018-04-23 MED ORDER — MEASLES, MUMPS & RUBELLA VAC ~~LOC~~ INJ
0.50 | INJECTION | SUBCUTANEOUS | Status: DC
Start: ? — End: 2018-04-23

## 2018-04-23 MED ORDER — GENERIC EXTERNAL MEDICATION
4.00 | Status: DC
Start: ? — End: 2018-04-23

## 2018-04-23 MED ORDER — GENERIC EXTERNAL MEDICATION
2.00 | Status: DC
Start: ? — End: 2018-04-23

## 2018-04-23 MED ORDER — LORAZEPAM 1 MG PO TABS
2.00 | ORAL_TABLET | ORAL | Status: DC
Start: ? — End: 2018-04-23

## 2018-04-24 LAB — TYPE AND SCREEN
ABO/RH(D): O POS
Antibody Screen: NEGATIVE
UNIT DIVISION: 0
Unit division: 0

## 2018-04-24 LAB — BPAM RBC
BLOOD PRODUCT EXPIRATION DATE: 201910082359
Blood Product Expiration Date: 201910082359
UNIT TYPE AND RH: 5100
Unit Type and Rh: 5100

## 2018-04-24 MED ORDER — ALBUTEROL SULFATE (2.5 MG/3ML) 0.083% IN NEBU
2.50 | INHALATION_SOLUTION | RESPIRATORY_TRACT | Status: DC
Start: 2018-04-24 — End: 2018-04-24

## 2018-04-24 MED ORDER — LIDOCAINE-EPINEPHRINE 1 %-1:100000 IJ SOLN
5.00 | INTRAMUSCULAR | Status: DC
Start: 2018-04-24 — End: 2018-04-24

## 2018-04-26 ENCOUNTER — Encounter: Payer: Self-pay | Admitting: *Deleted

## 2018-05-03 ENCOUNTER — Ambulatory Visit: Payer: Self-pay

## 2018-05-04 ENCOUNTER — Other Ambulatory Visit: Payer: Self-pay

## 2018-05-04 ENCOUNTER — Inpatient Hospital Stay (HOSPITAL_COMMUNITY)
Admission: AD | Admit: 2018-05-04 | Discharge: 2018-05-04 | Payer: Medicaid Other | Source: Ambulatory Visit | Attending: Family Medicine | Admitting: Family Medicine

## 2018-05-04 ENCOUNTER — Encounter (HOSPITAL_COMMUNITY): Payer: Self-pay | Admitting: *Deleted

## 2018-05-04 DIAGNOSIS — O902 Hematoma of obstetric wound: Secondary | ICD-10-CM | POA: Insufficient documentation

## 2018-05-04 DIAGNOSIS — R21 Rash and other nonspecific skin eruption: Secondary | ICD-10-CM | POA: Diagnosis not present

## 2018-05-04 DIAGNOSIS — Z881 Allergy status to other antibiotic agents status: Secondary | ICD-10-CM | POA: Diagnosis not present

## 2018-05-04 DIAGNOSIS — O9089 Other complications of the puerperium, not elsewhere classified: Secondary | ICD-10-CM

## 2018-05-04 DIAGNOSIS — Z886 Allergy status to analgesic agent status: Secondary | ICD-10-CM | POA: Diagnosis not present

## 2018-05-04 DIAGNOSIS — F17210 Nicotine dependence, cigarettes, uncomplicated: Secondary | ICD-10-CM | POA: Diagnosis not present

## 2018-05-04 DIAGNOSIS — N939 Abnormal uterine and vaginal bleeding, unspecified: Secondary | ICD-10-CM | POA: Diagnosis present

## 2018-05-04 LAB — CBC WITH DIFFERENTIAL/PLATELET
BASOS ABS: 0.1 10*3/uL (ref 0.0–0.1)
Basophils Relative: 1 %
EOS PCT: 8 %
Eosinophils Absolute: 0.8 10*3/uL — ABNORMAL HIGH (ref 0.0–0.7)
HCT: 34.1 % — ABNORMAL LOW (ref 36.0–46.0)
Hemoglobin: 10.9 g/dL — ABNORMAL LOW (ref 12.0–15.0)
LYMPHS ABS: 2.5 10*3/uL (ref 0.7–4.0)
LYMPHS PCT: 26 %
MCH: 28.2 pg (ref 26.0–34.0)
MCHC: 32 g/dL (ref 30.0–36.0)
MCV: 88.1 fL (ref 78.0–100.0)
MONO ABS: 0.3 10*3/uL (ref 0.1–1.0)
Monocytes Relative: 4 %
Neutro Abs: 6.1 10*3/uL (ref 1.7–7.7)
Neutrophils Relative %: 63 %
PLATELETS: 456 10*3/uL — AB (ref 150–400)
RBC: 3.87 MIL/uL (ref 3.87–5.11)
RDW: 14.1 % (ref 11.5–15.5)
WBC: 9.8 10*3/uL (ref 4.0–10.5)

## 2018-05-04 LAB — WET PREP, GENITAL
Clue Cells Wet Prep HPF POC: NONE SEEN
SPERM: NONE SEEN
TRICH WET PREP: NONE SEEN
YEAST WET PREP: NONE SEEN

## 2018-05-04 MED ORDER — OXYCODONE-ACETAMINOPHEN 5-325 MG PO TABS
2.0000 | ORAL_TABLET | Freq: Once | ORAL | Status: AC
  Administered 2018-05-04: 2 via ORAL
  Filled 2018-05-04: qty 2

## 2018-05-04 MED ORDER — HYDROCORTISONE 2.5 % EX LOTN: TOPICAL_LOTION | Freq: Two times a day (BID) | CUTANEOUS | 0 refills | Status: DC

## 2018-05-04 NOTE — Progress Notes (Signed)
Vaginal swabs completed by L. Leftwich-Kirby, CNM
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
## 2018-05-04 NOTE — Progress Notes (Signed)
Bedside ultrasound done by Dr. Shawnie Pons to assess swelling noted on abdominal incision.  MD reports fluid collection, seroma, will be aspirated once staples removed.

## 2018-05-04 NOTE — Progress Notes (Addendum)
Staples removed by Dr. Shawnie Pons.  Seroma aspirated using #18 needle, approximately 27cc of serous anguinuos fluid removed.  Pt tolerated procedure well.

## 2018-05-04 NOTE — MAU Note (Signed)
Pt presents with c/o incisional pain, staples intact. Also c/o bright red VB and passing quarter size clots.  Reports no void or BM since discharge from hospital. S/p C/S x5 04/22/2018 (delivered in [facility].)

## 2018-05-04 NOTE — Progress Notes (Signed)
RN assessed abdominal incision.  Skin well approximated, without redness or drainage.  Swelling noted mid incision & firm to palpation.  Pt reports area is tender to touch.

## 2018-05-07 LAB — GC/CHLAMYDIA PROBE AMP (~~LOC~~) NOT AT ARMC
Chlamydia: NEGATIVE
NEISSERIA GONORRHEA: NEGATIVE

## 2018-05-14 ENCOUNTER — Telehealth: Payer: Self-pay | Admitting: Family Medicine

## 2018-05-14 ENCOUNTER — Ambulatory Visit: Payer: Self-pay | Admitting: Family Medicine

## 2018-05-14 ENCOUNTER — Encounter: Payer: Self-pay | Admitting: Family Medicine

## 2018-05-14 NOTE — Progress Notes (Signed)
Patient did not keep appointment today. She will be called to reschedule.  

## 2018-05-14 NOTE — Telephone Encounter (Signed)
Called pt to get her rescheduled for her missed PP check on 05/14/18. After asking to speak to patient phone hang up. Called back and the phone just rang. VM did not play so I could leave a message. Letter mailed out with new appt

## 2018-05-24 ENCOUNTER — Encounter: Payer: Self-pay | Admitting: *Deleted

## 2018-06-01 ENCOUNTER — Ambulatory Visit: Payer: Self-pay | Admitting: Obstetrics & Gynecology

## 2018-06-06 ENCOUNTER — Encounter (HOSPITAL_COMMUNITY): Payer: Self-pay | Admitting: Emergency Medicine

## 2018-06-06 ENCOUNTER — Other Ambulatory Visit: Payer: Self-pay

## 2018-06-06 ENCOUNTER — Ambulatory Visit (HOSPITAL_COMMUNITY)
Admission: EM | Admit: 2018-06-06 | Discharge: 2018-06-06 | Disposition: A | Discharge status: Home / Self Care | Payer: Medicaid Other | Attending: Family Medicine | Admitting: Family Medicine

## 2018-06-06 DIAGNOSIS — L02611 Cutaneous abscess of right foot: Secondary | ICD-10-CM

## 2018-06-06 DIAGNOSIS — M79671 Pain in right foot: Secondary | ICD-10-CM | POA: Diagnosis not present

## 2018-06-06 DIAGNOSIS — S90821A Blister (nonthermal), right foot, initial encounter: Secondary | ICD-10-CM

## 2018-06-06 DIAGNOSIS — L84 Corns and callosities: Secondary | ICD-10-CM

## 2018-06-06 DIAGNOSIS — R030 Elevated blood-pressure reading, without diagnosis of hypertension: Secondary | ICD-10-CM

## 2018-06-06 MED ORDER — ACETAMINOPHEN 325 MG PO TABS
650.0000 mg | ORAL_TABLET | Freq: Once | ORAL | Status: AC
  Administered 2018-06-06: 650 mg via ORAL

## 2018-06-06 MED ORDER — ACETAMINOPHEN 500 MG PO TABS: 500.0000 mg | ORAL_TABLET | Freq: Four times a day (QID) | ORAL | 0 refills | Status: DC | PRN

## 2018-06-06 MED ORDER — AMLODIPINE BESYLATE 5 MG PO TABS: 5.0000 mg | ORAL_TABLET | Freq: Every day | ORAL | 0 refills | Status: DC

## 2018-06-06 MED ORDER — ACETAMINOPHEN 325 MG PO TABS
ORAL_TABLET | ORAL | Status: AC
  Filled 2018-06-06: qty 2

## 2018-06-06 MED ORDER — DOXYCYCLINE HYCLATE 100 MG PO CAPS: 100.0000 mg | ORAL_CAPSULE | Freq: Two times a day (BID) | ORAL | 0 refills | Status: DC

## 2018-06-06 NOTE — Discharge Instructions (Addendum)
Bandage applied Wash site daily with warm water and mild soap Keep covered to avoid friction Doxycycline prescribed.  DO NOT TAKE IF BREASTFEEDING Take antibiotic as prescribed and to completion Tylenol extra strength prescribed.   Follow up here or with PCP if symptoms persists Return or go to the ED if you have any new or worsening symptoms  Blood pressure elevated in office.  Recheck in 24 hours. Please continue to monitor blood pressure at home and keep a log Eat a well balanced diet of fruits, vegetables and lean meats.  Avoid foods high in fat and salt Drink water.  At least half your body weight in ounces Exercise for at least 30 minutes daily We will start amlodipine.  Take daily.  DO NOT TAKE IF BREASTFEEDING PCP assistance initiated Please follow up with PCP or [facility] for further evaluation and management of high blood pressure.  Return or go to the ED if you have any new or worsening symptoms such as vision changes, fatigue, dizziness, chest pain, shortness of breath, nausea, swelling in your hands or feet, urinary symptoms, etc..Marland Kitchen

## 2018-06-06 NOTE — ED Notes (Signed)
Bed: UC01 Expected date: 06/06/18 Expected time:  Means of arrival:  Comments: For APPTS

## 2018-06-06 NOTE — ED Triage Notes (Addendum)
Patient noticed pain in left foot 3 days ago.  Now, sole of foot is very painful,  Patient has tried callus removal remedies with no luck, multiple areas of discolored tissue
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
## 2018-06-07 ENCOUNTER — Ambulatory Visit: Payer: Medicaid Other | Admitting: Podiatry

## 2018-06-07 ENCOUNTER — Encounter: Payer: Self-pay | Admitting: Podiatry

## 2018-06-07 DIAGNOSIS — R238 Other skin changes: Secondary | ICD-10-CM | POA: Diagnosis not present

## 2018-06-07 DIAGNOSIS — B353 Tinea pedis: Secondary | ICD-10-CM

## 2018-06-07 MED ORDER — KETOCONAZOLE 2 % EX CREA: 1.0000 "application " | TOPICAL_CREAM | Freq: Every day | CUTANEOUS | 2 refills | Status: DC

## 2018-06-07 MED ORDER — TRIAMCINOLONE ACETONIDE 0.025 % EX OINT: 1.0000 "application " | TOPICAL_OINTMENT | Freq: Two times a day (BID) | CUTANEOUS | 0 refills | Status: DC
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
## 2018-06-08 ENCOUNTER — Emergency Department (HOSPITAL_COMMUNITY)
Admission: EM | Admit: 2018-06-08 | Discharge: 2018-06-08 | Payer: Medicaid Other | Attending: Emergency Medicine | Admitting: Emergency Medicine

## 2018-06-08 ENCOUNTER — Emergency Department (HOSPITAL_COMMUNITY): Payer: Medicaid Other

## 2018-06-08 ENCOUNTER — Other Ambulatory Visit: Payer: Self-pay | Admitting: Podiatry

## 2018-06-08 ENCOUNTER — Encounter (HOSPITAL_COMMUNITY): Payer: Self-pay | Admitting: Emergency Medicine

## 2018-06-08 DIAGNOSIS — S61412A Laceration without foreign body of left hand, initial encounter: Secondary | ICD-10-CM

## 2018-06-08 DIAGNOSIS — T148XXA Other injury of unspecified body region, initial encounter: Secondary | ICD-10-CM

## 2018-06-08 DIAGNOSIS — Y939 Activity, unspecified: Secondary | ICD-10-CM | POA: Diagnosis not present

## 2018-06-08 DIAGNOSIS — Y92009 Unspecified place in unspecified non-institutional (private) residence as the place of occurrence of the external cause: Secondary | ICD-10-CM | POA: Diagnosis not present

## 2018-06-08 DIAGNOSIS — Y999 Unspecified external cause status: Secondary | ICD-10-CM | POA: Insufficient documentation

## 2018-06-08 DIAGNOSIS — S61012A Laceration without foreign body of left thumb without damage to nail, initial encounter: Secondary | ICD-10-CM | POA: Insufficient documentation

## 2018-06-08 DIAGNOSIS — S0083XA Contusion of other part of head, initial encounter: Secondary | ICD-10-CM | POA: Diagnosis not present

## 2018-06-08 DIAGNOSIS — F17210 Nicotine dependence, cigarettes, uncomplicated: Secondary | ICD-10-CM | POA: Diagnosis not present

## 2018-06-08 DIAGNOSIS — S91301A Unspecified open wound, right foot, initial encounter: Secondary | ICD-10-CM | POA: Diagnosis not present

## 2018-06-08 DIAGNOSIS — Z23 Encounter for immunization: Secondary | ICD-10-CM | POA: Diagnosis not present

## 2018-06-08 DIAGNOSIS — R51 Headache: Secondary | ICD-10-CM | POA: Insufficient documentation

## 2018-06-08 LAB — I-STAT BETA HCG BLOOD, ED (MC, WL, AP ONLY)

## 2018-06-08 MED ORDER — BACITRACIN ZINC 500 UNIT/GM EX OINT
TOPICAL_OINTMENT | CUTANEOUS | Status: AC
  Administered 2018-06-08: 4
  Filled 2018-06-08: qty 3.6

## 2018-06-08 MED ORDER — LIDOCAINE HCL (PF) 1 % IJ SOLN
10.0000 mL | Freq: Once | INTRAMUSCULAR | Status: AC
  Administered 2018-06-08: 10 mL via INTRADERMAL
  Filled 2018-06-08: qty 30

## 2018-06-08 MED ORDER — CIPROFLOXACIN HCL 500 MG PO TABS: 500.0000 mg | ORAL_TABLET | Freq: Two times a day (BID) | ORAL | 0 refills | Status: AC

## 2018-06-08 MED ORDER — TETANUS-DIPHTH-ACELL PERTUSSIS 5-2.5-18.5 LF-MCG/0.5 IM SUSP
0.5000 mL | Freq: Once | INTRAMUSCULAR | Status: AC
  Administered 2018-06-08: 0.5 mL via INTRAMUSCULAR
  Filled 2018-06-08: qty 0.5

## 2018-06-08 MED ORDER — HYDROCODONE-ACETAMINOPHEN 5-325 MG PO TABS
1.0000 | ORAL_TABLET | Freq: Once | ORAL | Status: AC
  Administered 2018-06-08: 1 via ORAL
  Filled 2018-06-08: qty 1

## 2018-06-08 MED ORDER — BACITRACIN ZINC 500 UNIT/GM EX OINT: TOPICAL_OINTMENT | Freq: Once | CUTANEOUS | Status: DC

## 2018-06-08 NOTE — ED Notes (Signed)
Patient transported to CT 

## 2018-06-08 NOTE — Discharge Instructions (Addendum)
You can take Tylenol or Ibuprofen as directed for pain. You can alternate Tylenol and Ibuprofen every 4 hours. If you take Tylenol at 1pm, then you can take Ibuprofen at 5pm. Then you can take Tylenol again at 9pm.   Take antibiotics as directed. Please take all of your antibiotics until finished. Do not breast feed while taking this medication.   Keep the wound clean and dry for the first 24 hours. After that you may gently clean the wound with soap and water. Make sure to pat dry the wound before covering it with any dressing. You can use topical antibiotic ointment and bandage. Ice and elevate for pain relief.   For the foot, you need to soak it in Betadine and warm water daily to help with cleaning.  Make sure you are applying fresh bandages and apply Neosporin or bacitracin 2 times a day.  You can take Tylenol or Ibuprofen as directed for pain. You can alternate Tylenol and Ibuprofen every 4 hours for additional pain relief.   Return to the Emergency Department, your primary care doctor, or the [facility] in 5-7 days for suture removal.   Monitor closely for any signs of infection. Return to the Emergency Department for any worsening redness/swelling of the area that begins to spread, drainage from the site, worsening pain, fever or any other worsening or concerning symptoms.

## 2018-06-08 NOTE — ED Notes (Signed)
ED Provider at bedside for suturing.  

## 2018-06-08 NOTE — ED Notes (Signed)
Delay in obtaining blood work and vitals d/t pt request to speak with officer and give statement prior to medical treatment.
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
## 2018-06-08 NOTE — ED Notes (Signed)
Patient transported to CT and XR 

## 2018-06-11 ENCOUNTER — Telehealth: Payer: Self-pay | Admitting: *Deleted

## 2018-06-11 LAB — WOUND CULTURE
MICRO NUMBER: 91348341
SPECIMEN QUALITY: ADEQUATE

## 2018-06-11 MED ORDER — SULFAMETHOXAZOLE-TRIMETHOPRIM 800-160 MG PO TABS: 1.0000 | ORAL_TABLET | Freq: Two times a day (BID) | ORAL | 0 refills | Status: DC

## 2018-06-11 NOTE — Telephone Encounter (Signed)
I spoke with Barrington Ellison, pt's fiancee states he is on the paperwork to release information. I reviewed the Media and pt was signed on 06/08/2018. I informed Marcy Salvo of Dr. Gabriel Rung review of results and orders.

## 2018-06-11 NOTE — Telephone Encounter (Signed)
-----   Message from Vivi Barrack, DPM sent at 06/11/2018  8:13 AM EST ----- Val- Please let her know that the culture is growing a staph infection. She is on cipro from the urgent care it looks like but can you please have her add Bactrim DS 1 tab PO BID for 10 days as well. Thanks.

## 2018-06-12 ENCOUNTER — Telehealth: Payer: Self-pay | Admitting: *Deleted

## 2018-06-12 NOTE — Telephone Encounter (Signed)
Dr. Flossie BuffyHustling - pathology states they received a specimen that says right infected blister, and the bottle of specimen says right, but the ICD code says left, and where is the specimen from on the foot.

## 2018-06-12 NOTE — Telephone Encounter (Signed)
Called and spoke with Dr Flossie BuffyHustling about the biopsy and Dr Flossie BuffyHustling will be sending a report soon. Tamara Bradford

## 2018-06-13 LAB — TISSUE SPECIMEN

## 2018-06-13 LAB — PATHOLOGY

## 2018-06-15 NOTE — Telephone Encounter (Signed)
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 Can you see how she is doing with the cream? Thanks.

## 2018-06-15 NOTE — Telephone Encounter (Signed)
Left message for pt to call for results  

## 2018-06-18 ENCOUNTER — Ambulatory Visit: Payer: Medicaid Other | Admitting: Podiatry

## 2018-06-20 ENCOUNTER — Encounter: Payer: Self-pay | Admitting: *Deleted
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
## 2018-06-20 NOTE — Telephone Encounter (Signed)
Mailed Dr. Gabriel RungWagoner's review of skin biopsy results to pt's mailing address.

## 2018-07-03 ENCOUNTER — Telehealth: Payer: Self-pay | Admitting: Family Medicine
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
## 2018-08-27 ENCOUNTER — Ambulatory Visit: Payer: Self-pay

## 2018-08-28 ENCOUNTER — Ambulatory Visit: Payer: Self-pay

## 2018-08-31 ENCOUNTER — Ambulatory Visit (INDEPENDENT_AMBULATORY_CARE_PROVIDER_SITE_OTHER): Payer: Self-pay

## 2018-08-31 ENCOUNTER — Encounter: Payer: Self-pay | Admitting: Obstetrics & Gynecology

## 2018-08-31 DIAGNOSIS — Z3201 Encounter for pregnancy test, result positive: Secondary | ICD-10-CM

## 2018-08-31 DIAGNOSIS — O10919 Unspecified pre-existing hypertension complicating pregnancy, unspecified trimester: Secondary | ICD-10-CM

## 2018-08-31 DIAGNOSIS — Z789 Other specified health status: Secondary | ICD-10-CM

## 2018-08-31 LAB — POCT PREGNANCY, URINE: Preg Test, Ur: POSITIVE — AB

## 2018-08-31 MED ORDER — LABETALOL HCL 200 MG PO TABS: 200.0000 mg | ORAL_TABLET | Freq: Two times a day (BID) | ORAL | 3 refills | Status: DC

## 2018-08-31 NOTE — Progress Notes (Signed)
Pt here today for pregnancy test.  Resulted positive. Pt unsure of LMP.  Medications reconciled. Pt currently taking Norvasc 5 mg for HTN; notified Dr. Jolayne Panther who recommended that pt stop Norvasc and begin taking Labetalol 100 mg po bid.  Notified pt of change.  Pt stated understanding.  List of medications safe to take in pregnancy given to pt.  OB US scheduled for February 6th @ 0900 due to pt unsure of LMP.  Proof of pregnancy letter provided by the front office.

## 2018-09-06 ENCOUNTER — Ambulatory Visit (HOSPITAL_COMMUNITY): Payer: Self-pay

## 2018-09-12 ENCOUNTER — Ambulatory Visit (HOSPITAL_COMMUNITY): Payer: Medicaid Other

## 2018-09-13 ENCOUNTER — Ambulatory Visit (HOSPITAL_COMMUNITY): Payer: Self-pay | Attending: Advanced Practice Midwife

## 2018-09-16 ENCOUNTER — Emergency Department (HOSPITAL_COMMUNITY)
Admission: EM | Admit: 2018-09-16 | Discharge: 2018-09-16 | Disposition: A | Discharge status: Home / Self Care | Payer: Medicaid Other | Attending: Emergency Medicine | Admitting: Emergency Medicine

## 2018-09-16 ENCOUNTER — Encounter: Payer: Self-pay | Admitting: Emergency Medicine

## 2018-09-16 DIAGNOSIS — J4541 Moderate persistent asthma with (acute) exacerbation: Secondary | ICD-10-CM | POA: Insufficient documentation

## 2018-09-16 DIAGNOSIS — J45901 Unspecified asthma with (acute) exacerbation: Secondary | ICD-10-CM

## 2018-09-16 DIAGNOSIS — F17210 Nicotine dependence, cigarettes, uncomplicated: Secondary | ICD-10-CM | POA: Insufficient documentation

## 2018-09-16 DIAGNOSIS — Z79899 Other long term (current) drug therapy: Secondary | ICD-10-CM | POA: Diagnosis not present

## 2018-09-16 DIAGNOSIS — R062 Wheezing: Secondary | ICD-10-CM | POA: Diagnosis present

## 2018-09-16 MED ORDER — PREDNISONE 20 MG PO TABS: ORAL_TABLET | ORAL | 0 refills | Status: DC

## 2018-09-16 MED ORDER — ALBUTEROL SULFATE HFA 108 (90 BASE) MCG/ACT IN AERS
2.0000 | INHALATION_SPRAY | RESPIRATORY_TRACT | Status: DC | PRN
  Filled 2018-09-16: qty 6.7

## 2018-09-16 MED ORDER — PREDNISONE 20 MG PO TABS
60.0000 mg | ORAL_TABLET | Freq: Once | ORAL | Status: AC
  Administered 2018-09-16: 60 mg via ORAL
  Filled 2018-09-16: qty 3

## 2018-09-16 MED ORDER — ALBUTEROL SULFATE (2.5 MG/3ML) 0.083% IN NEBU
5.0000 mg | INHALATION_SOLUTION | Freq: Once | RESPIRATORY_TRACT | Status: AC
  Administered 2018-09-16: 5 mg via RESPIRATORY_TRACT
  Filled 2018-09-16: qty 6

## 2018-09-16 NOTE — ED Triage Notes (Signed)
Pt reports not having inhaler for 3 days due to her husband being in the hospital. Pt states she normal uses her inhaler twice a day.

## 2018-09-16 NOTE — ED Notes (Signed)
MD Belfi found fetal heart tones with bedside US.
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
## 2018-09-20 ENCOUNTER — Encounter (HOSPITAL_COMMUNITY): Payer: Self-pay

## 2018-09-20 ENCOUNTER — Other Ambulatory Visit: Payer: Self-pay

## 2018-09-20 ENCOUNTER — Emergency Department (HOSPITAL_COMMUNITY)
Admission: EM | Admit: 2018-09-20 | Discharge: 2018-09-20 | Disposition: A | Discharge status: Home / Self Care | Payer: Medicaid Other | Attending: Emergency Medicine | Admitting: Emergency Medicine

## 2018-09-20 DIAGNOSIS — F17210 Nicotine dependence, cigarettes, uncomplicated: Secondary | ICD-10-CM | POA: Diagnosis not present

## 2018-09-20 DIAGNOSIS — J45909 Unspecified asthma, uncomplicated: Secondary | ICD-10-CM | POA: Insufficient documentation

## 2018-09-20 DIAGNOSIS — J9801 Acute bronchospasm: Secondary | ICD-10-CM | POA: Insufficient documentation

## 2018-09-20 DIAGNOSIS — Z79899 Other long term (current) drug therapy: Secondary | ICD-10-CM | POA: Insufficient documentation

## 2018-09-20 DIAGNOSIS — R0602 Shortness of breath: Secondary | ICD-10-CM | POA: Diagnosis present

## 2018-09-20 NOTE — ED Triage Notes (Signed)
Pt arrives via [facility] for eval of shortness of breath/wheezing onset [place] and did not have inhaler. Pt has hx of asthma. EMS admin 5 mg albuterol w/ relief of sx. Pt is currently 4 mos pregnant w/ twins. No pregnancy related complaints.

## 2018-09-20 NOTE — ED Notes (Signed)
Patient verbalizes understanding of discharge instructions. Opportunity for questioning and answers were provided. Armband removed by staff, pt discharged from ED ambulatory by self\  
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
## 2018-10-04 ENCOUNTER — Other Ambulatory Visit: Payer: Self-pay | Admitting: *Deleted

## 2018-10-04 DIAGNOSIS — Z3490 Encounter for supervision of normal pregnancy, unspecified, unspecified trimester: Secondary | ICD-10-CM

## 2018-10-04 NOTE — Progress Notes (Signed)
Called Tamara Bradford in regards to her upcoming office appt on 3/10. Tamara Bradford stated that she cannot keep appt for that Taegan Haider due to she has to be in court. Her appointment will be rescheduled. I advised Tamara Bradford that she also needs ultrasound as previously scheduled in order to determine gestational age of baby. Tamara Bradford agreed to ultrasound on 3/16 @ 0800. She was informed of the need to have a full bladder for the exam. She will come to our office to receive results following the ultrasound. Tamara Bradford voiced understanding of all information given.

## 2018-10-12 ENCOUNTER — Telehealth: Payer: Self-pay

## 2018-10-12 NOTE — Telephone Encounter (Signed)
Pt called requesting have medication called into her pharmacy.  LM that we are returning her call to please give the office a call back and that our office is currently closed for the weekend and will reopen on Monday.

## 2018-10-15 ENCOUNTER — Encounter: Payer: Self-pay | Admitting: Obstetrics & Gynecology

## 2018-10-15 ENCOUNTER — Other Ambulatory Visit (HOSPITAL_COMMUNITY): Payer: Self-pay | Admitting: Obstetrics and Gynecology

## 2018-10-15 ENCOUNTER — Other Ambulatory Visit: Payer: Self-pay

## 2018-10-15 ENCOUNTER — Other Ambulatory Visit: Payer: Self-pay | Admitting: Obstetrics and Gynecology

## 2018-10-15 ENCOUNTER — Telehealth: Payer: Self-pay | Admitting: Obstetrics & Gynecology

## 2018-10-15 ENCOUNTER — Other Ambulatory Visit (HOSPITAL_COMMUNITY): Payer: Self-pay | Admitting: *Deleted

## 2018-10-15 ENCOUNTER — Other Ambulatory Visit: Payer: Self-pay | Admitting: General Practice

## 2018-10-15 ENCOUNTER — Ambulatory Visit (HOSPITAL_BASED_OUTPATIENT_CLINIC_OR_DEPARTMENT_OTHER)
Admission: RE | Admit: 2018-10-15 | Discharge: 2018-10-15 | Disposition: A | Discharge status: Home / Self Care | Payer: Medicaid Other | Source: Ambulatory Visit | Attending: Obstetrics and Gynecology | Admitting: Obstetrics and Gynecology

## 2018-10-15 ENCOUNTER — Ambulatory Visit (HOSPITAL_COMMUNITY)
Admission: RE | Admit: 2018-10-15 | Discharge: 2018-10-15 | Disposition: A | Discharge status: Home / Self Care | Payer: Medicaid Other | Source: Ambulatory Visit | Attending: Obstetrics and Gynecology | Admitting: Obstetrics and Gynecology

## 2018-10-15 DIAGNOSIS — Z3490 Encounter for supervision of normal pregnancy, unspecified, unspecified trimester: Secondary | ICD-10-CM

## 2018-10-15 DIAGNOSIS — O09292 Supervision of pregnancy with other poor reproductive or obstetric history, second trimester: Secondary | ICD-10-CM | POA: Diagnosis not present

## 2018-10-15 DIAGNOSIS — O0942 Supervision of pregnancy with grand multiparity, second trimester: Secondary | ICD-10-CM | POA: Diagnosis not present

## 2018-10-15 DIAGNOSIS — O09522 Supervision of elderly multigravida, second trimester: Secondary | ICD-10-CM | POA: Diagnosis not present

## 2018-10-15 DIAGNOSIS — Z3A16 16 weeks gestation of pregnancy: Secondary | ICD-10-CM

## 2018-10-15 DIAGNOSIS — Z789 Other specified health status: Secondary | ICD-10-CM | POA: Insufficient documentation

## 2018-10-15 DIAGNOSIS — O43102 Malformation of placenta, unspecified, second trimester: Secondary | ICD-10-CM

## 2018-10-15 DIAGNOSIS — Z362 Encounter for other antenatal screening follow-up: Secondary | ICD-10-CM

## 2018-10-15 DIAGNOSIS — O3680X0 Pregnancy with inconclusive fetal viability, not applicable or unspecified: Secondary | ICD-10-CM

## 2018-10-15 DIAGNOSIS — J4521 Mild intermittent asthma with (acute) exacerbation: Secondary | ICD-10-CM

## 2018-10-15 MED ORDER — ALBUTEROL SULFATE HFA 108 (90 BASE) MCG/ACT IN AERS: 2.0000 | INHALATION_SPRAY | Freq: Four times a day (QID) | RESPIRATORY_TRACT | 3 refills | Status: DC | PRN

## 2018-10-15 MED ORDER — PRENATAL PLUS 27-1 MG PO TABS: 1.0000 | ORAL_TABLET | Freq: Every day | ORAL | 11 refills | Status: DC

## 2018-10-15 MED ORDER — PREDNISONE 20 MG PO TABS: ORAL_TABLET | ORAL | 0 refills | Status: DC

## 2018-10-15 NOTE — Telephone Encounter (Signed)
The patient called in requesting information about an upcoming appointment. Informed the patient the appointment for today is scheulded with Ultrasound at 8;00am. The patient stated she was told 8:30am. She also asked if I would call the department. I called however received no answer. Offered the patient the number however she declined.

## 2018-10-17 ENCOUNTER — Encounter: Payer: Self-pay | Admitting: Obstetrics and Gynecology

## 2018-10-17 DIAGNOSIS — O43109 Malformation of placenta, unspecified, unspecified trimester: Secondary | ICD-10-CM | POA: Insufficient documentation

## 2018-10-22 ENCOUNTER — Encounter: Payer: Self-pay | Admitting: *Deleted

## 2018-10-22 ENCOUNTER — Other Ambulatory Visit (HOSPITAL_COMMUNITY)
Admission: RE | Admit: 2018-10-22 | Discharge: 2018-10-22 | Disposition: A | Discharge status: Home / Self Care | Payer: Medicaid Other | Source: Ambulatory Visit | Attending: Obstetrics & Gynecology | Admitting: Obstetrics & Gynecology

## 2018-10-22 ENCOUNTER — Telehealth: Payer: Self-pay | Admitting: *Deleted

## 2018-10-22 ENCOUNTER — Ambulatory Visit (INDEPENDENT_AMBULATORY_CARE_PROVIDER_SITE_OTHER): Payer: Medicaid Other | Admitting: Obstetrics & Gynecology

## 2018-10-22 ENCOUNTER — Other Ambulatory Visit: Payer: Self-pay

## 2018-10-22 ENCOUNTER — Encounter: Payer: Self-pay | Admitting: Obstetrics & Gynecology

## 2018-10-22 VITALS — BP 108/76 | HR 94 | Temp 97.8°F | Wt 159.9 lb

## 2018-10-22 DIAGNOSIS — O09892 Supervision of other high risk pregnancies, second trimester: Secondary | ICD-10-CM

## 2018-10-22 DIAGNOSIS — Z3A17 17 weeks gestation of pregnancy: Secondary | ICD-10-CM

## 2018-10-22 DIAGNOSIS — O99320 Drug use complicating pregnancy, unspecified trimester: Secondary | ICD-10-CM | POA: Diagnosis not present

## 2018-10-22 DIAGNOSIS — O09292 Supervision of pregnancy with other poor reproductive or obstetric history, second trimester: Secondary | ICD-10-CM

## 2018-10-22 DIAGNOSIS — O43102 Malformation of placenta, unspecified, second trimester: Secondary | ICD-10-CM | POA: Diagnosis not present

## 2018-10-22 DIAGNOSIS — O0990 Supervision of high risk pregnancy, unspecified, unspecified trimester: Secondary | ICD-10-CM

## 2018-10-22 DIAGNOSIS — O0992 Supervision of high risk pregnancy, unspecified, second trimester: Secondary | ICD-10-CM | POA: Insufficient documentation

## 2018-10-22 DIAGNOSIS — O09522 Supervision of elderly multigravida, second trimester: Secondary | ICD-10-CM | POA: Insufficient documentation

## 2018-10-22 DIAGNOSIS — O99322 Drug use complicating pregnancy, second trimester: Secondary | ICD-10-CM | POA: Diagnosis not present

## 2018-10-22 DIAGNOSIS — O34219 Maternal care for unspecified type scar from previous cesarean delivery: Secondary | ICD-10-CM | POA: Diagnosis present

## 2018-10-22 DIAGNOSIS — O43109 Malformation of placenta, unspecified, unspecified trimester: Secondary | ICD-10-CM

## 2018-10-22 MED ORDER — ASPIRIN EC 81 MG PO TBEC: 81.0000 mg | DELAYED_RELEASE_TABLET | Freq: Every day | ORAL | 2 refills | Status: AC

## 2018-10-22 MED ORDER — PREPLUS 27-1 MG PO TABS: 1.0000 | ORAL_TABLET | Freq: Every day | ORAL | 13 refills | Status: DC

## 2018-10-22 NOTE — Patient Instructions (Signed)
Return to office for any scheduled appointments. Call the office or go to the MAU at [facility] if:  You begin to have strong, frequent contractions  Your water breaks.  Sometimes it is a big gush of fluid, sometimes it is just a trickle that keeps getting your panties wet or running down your legs  You have vaginal bleeding.  It is normal to have a small amount of spotting if your cervix was checked.   You do not feel your baby moving like normal.  If you do not, get something to eat and drink and lay down and focus on feeling your baby move.   If your baby is still not moving like normal, you should call the office or go to MAU.  Any other obstetric concerns.   
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
## 2018-10-22 NOTE — Telephone Encounter (Signed)
Preplus is usually covered by medicaid- I attempted to put in order for Vitafol Ultra but unable to find in Epic. I called pharmacy to discuss and left a voicemail that I was calling back to discuss their request as order written is usually covered -please call us back.

## 2018-10-22 NOTE — Telephone Encounter (Signed)
Received a voicemail from [employer] stating they received a prescription from [facility] for preplus 27+1 Prenatal vitamin and this is not covered by Medicaid - need changed to prenatal that is covered by Medicaid such as vitafol ultra sent to pharmacy.

## 2018-10-24 LAB — CYTOLOGY - PAP
Adequacy: ABSENT
CHLAMYDIA, DNA PROBE: NEGATIVE
Diagnosis: NEGATIVE
HPV: NOT DETECTED
Neisseria Gonorrhea: NEGATIVE

## 2018-10-24 LAB — COMPREHENSIVE METABOLIC PANEL
A/G RATIO: 1.3 (ref 1.2–2.2)
ALT: 43 IU/L — ABNORMAL HIGH (ref 0–32)
AST: 35 IU/L (ref 0–40)
Albumin: 3.8 g/dL (ref 3.8–4.8)
Alkaline Phosphatase: 103 IU/L (ref 39–117)
BUN/Creatinine Ratio: 13 (ref 9–23)
BUN: 7 mg/dL (ref 6–24)
Bilirubin Total: <0.2 mg/dL (ref 0.0–1.2)
CALCIUM: 9.3 mg/dL (ref 8.7–10.2)
CO2: 18 mmol/L — ABNORMAL LOW (ref 20–29)
Chloride: 102 mmol/L (ref 96–106)
Creatinine, Ser: 0.52 mg/dL — ABNORMAL LOW (ref 0.57–1.00)
GFR, EST AFRICAN AMERICAN: 136 mL/min/{1.73_m2} (ref >59)
GFR, EST NON AFRICAN AMERICAN: 118 mL/min/{1.73_m2} (ref >59)
Globulin, Total: 2.9 g/dL (ref 1.5–4.5)
Glucose: 89 mg/dL (ref 65–99)
POTASSIUM: 4.2 mmol/L (ref 3.5–5.2)
Sodium: 135 mmol/L (ref 134–144)
TOTAL PROTEIN: 6.7 g/dL (ref 6.0–8.5)

## 2018-10-24 LAB — OBSTETRIC PANEL, INCLUDING HIV
ANTIBODY SCREEN: NEGATIVE
BASOS: 1 %
Basophils Absolute: 0.1 10*3/uL (ref 0.0–0.2)
EOS (ABSOLUTE): 0.6 10*3/uL — ABNORMAL HIGH (ref 0.0–0.4)
Eos: 6 %
HEMATOCRIT: 36.1 % (ref 34.0–46.6)
HEMOGLOBIN: 12.1 g/dL (ref 11.1–15.9)
HIV Screen 4th Generation wRfx: NONREACTIVE
Hepatitis B Surface Ag: NEGATIVE
Immature Grans (Abs): 0.1 10*3/uL (ref 0.0–0.1)
Immature Granulocytes: 1 %
LYMPHS ABS: 2.2 10*3/uL (ref 0.7–3.1)
Lymphs: 21 %
MCH: 27.8 pg (ref 26.6–33.0)
MCHC: 33.5 g/dL (ref 31.5–35.7)
MCV: 83 fL (ref 79–97)
Monocytes Absolute: 0.8 10*3/uL (ref 0.1–0.9)
Monocytes: 8 %
Neutrophils Absolute: 6.5 10*3/uL (ref 1.4–7.0)
Neutrophils: 63 %
Platelets: 265 10*3/uL (ref 150–450)
RBC: 4.35 x10E6/uL (ref 3.77–5.28)
RDW: 15.2 % (ref 11.7–15.4)
RH TYPE: POSITIVE
RPR Ser Ql: NONREACTIVE
Rubella Antibodies, IGG: 12.8 index (ref >0.99)
WBC: 10.2 10*3/uL (ref 3.4–10.8)

## 2018-10-24 LAB — URINE CULTURE, OB REFLEX

## 2018-10-24 LAB — AFP, SERUM, OPEN SPINA BIFIDA
AFP MOM: 0.85
AFP Value: 35.6 ng/mL
Gest. Age on Collection Date: 17.3 weeks
MATERNAL AGE AT EDD: 42.6 a
OSBR Risk 1 IN: 10000
TEST RESULTS AFP: NEGATIVE
Weight: 159 [lb_av]

## 2018-10-24 LAB — CULTURE, OB URINE

## 2018-10-24 LAB — HEMOGLOBIN A1C
Est. average glucose Bld gHb Est-mCnc: 126 mg/dL
HEMOGLOBIN A1C: 6 % — AB (ref 4.8–5.6)

## 2018-10-24 LAB — PROTEIN / CREATININE RATIO, URINE
Creatinine, Urine: 103.7 mg/dL
Protein, Ur: 10.6 mg/dL
Protein/Creat Ratio: 102 mg/g creat (ref 0–200)

## 2018-10-24 LAB — TSH: TSH: 0.794 u[IU]/mL (ref 0.450–4.500)

## 2018-10-25 ENCOUNTER — Encounter: Payer: Self-pay | Admitting: Obstetrics & Gynecology

## 2018-10-25 LAB — TOXASSURE SELECT 13 (MW), URINE

## 2018-10-30 MED ORDER — VITAFOL ULTRA 29-0.6-0.4-200 MG PO CAPS: 1.0000 | ORAL_CAPSULE | Freq: Every day | ORAL | 12 refills | Status: DC

## 2018-10-30 NOTE — Telephone Encounter (Signed)
Was able to order the Vidafol Ultra recommended by pharmacy to replace prenatal vitamins that were not covered by Medicaid. Pharmacist, on the phone, verified correct medication.

## 2018-11-01 ENCOUNTER — Encounter: Payer: Self-pay | Admitting: Advanced Practice Midwife

## 2018-11-06 ENCOUNTER — Encounter: Payer: Self-pay | Admitting: *Deleted

## 2018-11-12 ENCOUNTER — Ambulatory Visit (HOSPITAL_COMMUNITY): Payer: Medicaid Other

## 2018-11-12 ENCOUNTER — Encounter (HOSPITAL_COMMUNITY): Payer: Self-pay

## 2018-11-12 ENCOUNTER — Ambulatory Visit (HOSPITAL_COMMUNITY): Payer: Medicaid Other | Attending: Obstetrics and Gynecology

## 2018-11-16 ENCOUNTER — Telehealth: Payer: Self-pay | Admitting: Obstetrics & Gynecology

## 2018-11-16 NOTE — Telephone Encounter (Signed)
Called the patient to inform of upcoming face to face visit. Left a detailed voicemail message.

## 2018-11-19 ENCOUNTER — Encounter: Payer: Self-pay | Admitting: Family Medicine

## 2018-11-21 ENCOUNTER — Encounter: Payer: Self-pay | Admitting: Podiatry

## 2018-11-21 ENCOUNTER — Telehealth: Payer: Self-pay | Admitting: Podiatry

## 2018-11-21 NOTE — Telephone Encounter (Signed)
I would like to see her before stating any medications since it looks like she is pregnant.
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
## 2018-11-21 NOTE — Telephone Encounter (Signed)
Patient wants another dose of anti-biotic for her foot. Please call patient. Will schedule her another follow up visit.

## 2018-11-21 NOTE — Telephone Encounter (Signed)
I sent her a MyChart message to see what is going on and why she needs an antibiotic. If you wanted to call as well that would be great. I asked her to send a picture of her feet and offered an e-visit. I have not seen her since November. Thanks.

## 2018-11-25 NOTE — Progress Notes (Signed)
COVID Hotel Screening performed. Temperature, PHQ-9, and need for medical care and medications assessed. No needs assessed at this time.  Maliki Gignac RN MSN 

## 2018-11-26 ENCOUNTER — Ambulatory Visit: Payer: Medicaid Other | Admitting: Podiatry

## 2018-11-27 ENCOUNTER — Telehealth: Payer: Self-pay | Admitting: *Deleted

## 2018-11-27 NOTE — Telephone Encounter (Signed)
-----   Message from Vivien Rota sent at 11/27/2018 11:55 AM EDT ----- Regarding: FW: MFM follow up  ----- Message ----- From: Rolm Bookbinder, CNM Sent: 11/24/2018   7:31 PM EDT To: [facility] Admin Pool Subject: MFM follow up                                  This patient needs follow up at MFM ASAP for diagnosis of possible accreta!!

## 2018-11-27 NOTE — Telephone Encounter (Signed)
Scheduled pt follow up ultrasound for 11/28/18 @ 1245.  Called pt to inform her of this papointment.  Pt did not pick up.  Left voicemail advising pt that she has been scheduled for an appointment for ultrasound and giving her the day and time.  Pt advised to contact the MFM (number provided) if she has any questions or concerns.

## 2018-11-28 ENCOUNTER — Ambulatory Visit (HOSPITAL_COMMUNITY)
Admission: RE | Admit: 2018-11-28 | Discharge: 2018-11-28 | Disposition: A | Discharge status: Home / Self Care | Payer: Medicaid Other | Source: Ambulatory Visit | Attending: Obstetrics and Gynecology | Admitting: Obstetrics and Gynecology

## 2018-11-28 ENCOUNTER — Other Ambulatory Visit: Payer: Self-pay

## 2018-11-28 ENCOUNTER — Encounter (HOSPITAL_COMMUNITY): Payer: Self-pay

## 2018-11-28 ENCOUNTER — Ambulatory Visit (HOSPITAL_COMMUNITY): Payer: Medicaid Other | Admitting: *Deleted

## 2018-11-28 VITALS — BP 118/73 | HR 96 | Temp 97.9°F

## 2018-11-28 DIAGNOSIS — O0990 Supervision of high risk pregnancy, unspecified, unspecified trimester: Secondary | ICD-10-CM

## 2018-11-28 DIAGNOSIS — Z362 Encounter for other antenatal screening follow-up: Secondary | ICD-10-CM | POA: Insufficient documentation

## 2018-11-28 DIAGNOSIS — O09529 Supervision of elderly multigravida, unspecified trimester: Secondary | ICD-10-CM

## 2018-11-29 ENCOUNTER — Other Ambulatory Visit (HOSPITAL_COMMUNITY): Payer: Self-pay | Admitting: *Deleted

## 2018-11-29 DIAGNOSIS — O43212 Placenta accreta, second trimester: Secondary | ICD-10-CM

## 2018-11-30 NOTE — Progress Notes (Signed)
COVID Hotel Screening performed. Temperature, PHQ-9, and need for medical care and medications assessed. No additional needs at this time.  Denna Fryberger RN MSN 

## 2018-12-03 ENCOUNTER — Encounter: Payer: Self-pay | Admitting: Family Medicine

## 2018-12-03 ENCOUNTER — Telehealth: Payer: Self-pay | Admitting: Family Medicine

## 2018-12-03 NOTE — Telephone Encounter (Signed)
Called the patient with an appointment reminder. The patient stated to her boyfriend she is busy and can not come to the phone. He urged her to come the phone, she did not. I asked if she intended on coming to the appointment and she stated no.

## 2018-12-03 NOTE — Telephone Encounter (Signed)
The patient also did not want to come to the phone to reschedule the appointment. Sending an appointment reminder with the new information, the patient also has mychart.

## 2018-12-07 NOTE — Progress Notes (Signed)
COVID Hotel Screening performed. Temperature, PHQ-9, and need for medical care and medications assessed. No additional needs assessed at this time.  Laquentin Loudermilk RN MSN 

## 2018-12-10 ENCOUNTER — Telehealth: Payer: Self-pay | Admitting: Obstetrics & Gynecology

## 2018-12-10 NOTE — Telephone Encounter (Signed)
Called the patient to confirm the appointment, left a detailed voicemail message. °

## 2018-12-12 ENCOUNTER — Encounter (HOSPITAL_COMMUNITY): Payer: Self-pay

## 2018-12-12 ENCOUNTER — Emergency Department (HOSPITAL_COMMUNITY)
Admission: EM | Admit: 2018-12-12 | Discharge: 2018-12-12 | Disposition: A | Discharge status: Home / Self Care | Payer: Medicaid Other | Attending: Emergency Medicine | Admitting: Emergency Medicine

## 2018-12-12 ENCOUNTER — Other Ambulatory Visit: Payer: Self-pay

## 2018-12-12 ENCOUNTER — Ambulatory Visit (INDEPENDENT_AMBULATORY_CARE_PROVIDER_SITE_OTHER): Payer: Medicaid Other | Admitting: Obstetrics & Gynecology

## 2018-12-12 VITALS — BP 148/68 | HR 109 | Temp 97.9°F | Wt 161.0 lb

## 2018-12-12 DIAGNOSIS — O9989 Other specified diseases and conditions complicating pregnancy, childbirth and the puerperium: Secondary | ICD-10-CM | POA: Insufficient documentation

## 2018-12-12 DIAGNOSIS — J4521 Mild intermittent asthma with (acute) exacerbation: Secondary | ICD-10-CM | POA: Insufficient documentation

## 2018-12-12 DIAGNOSIS — O43212 Placenta accreta, second trimester: Secondary | ICD-10-CM

## 2018-12-12 DIAGNOSIS — O34219 Maternal care for unspecified type scar from previous cesarean delivery: Secondary | ICD-10-CM | POA: Diagnosis not present

## 2018-12-12 DIAGNOSIS — Z3A24 24 weeks gestation of pregnancy: Secondary | ICD-10-CM | POA: Diagnosis not present

## 2018-12-12 DIAGNOSIS — F17210 Nicotine dependence, cigarettes, uncomplicated: Secondary | ICD-10-CM | POA: Diagnosis not present

## 2018-12-12 DIAGNOSIS — J4551 Severe persistent asthma with (acute) exacerbation: Secondary | ICD-10-CM

## 2018-12-12 DIAGNOSIS — Z20828 Contact with and (suspected) exposure to other viral communicable diseases: Secondary | ICD-10-CM | POA: Insufficient documentation

## 2018-12-12 DIAGNOSIS — O99512 Diseases of the respiratory system complicating pregnancy, second trimester: Secondary | ICD-10-CM

## 2018-12-12 DIAGNOSIS — O0990 Supervision of high risk pregnancy, unspecified, unspecified trimester: Secondary | ICD-10-CM

## 2018-12-12 DIAGNOSIS — Z79899 Other long term (current) drug therapy: Secondary | ICD-10-CM | POA: Diagnosis not present

## 2018-12-12 DIAGNOSIS — Z7982 Long term (current) use of aspirin: Secondary | ICD-10-CM | POA: Insufficient documentation

## 2018-12-12 MED ORDER — PREDNISONE 20 MG PO TABS
60.0000 mg | ORAL_TABLET | Freq: Once | ORAL | Status: AC
  Administered 2018-12-12: 10:00:00 60 mg via ORAL
  Filled 2018-12-12: qty 3

## 2018-12-12 MED ORDER — AEROCHAMBER Z-STAT PLUS/MEDIUM MISC
1.0000 | Freq: Once | Status: AC
  Administered 2018-12-12: 10:00:00 1
  Filled 2018-12-12: qty 1

## 2018-12-12 MED ORDER — ALBUTEROL SULFATE HFA 108 (90 BASE) MCG/ACT IN AERS
1.0000 | INHALATION_SPRAY | RESPIRATORY_TRACT | Status: DC | PRN
  Administered 2018-12-12: 10:00:00 2 via RESPIRATORY_TRACT
  Filled 2018-12-12 (×2): qty 6.7

## 2018-12-12 MED ORDER — ALBUTEROL SULFATE HFA 108 (90 BASE) MCG/ACT IN AERS: 2.0000 | INHALATION_SPRAY | Freq: Four times a day (QID) | RESPIRATORY_TRACT | 3 refills | Status: DC | PRN

## 2018-12-12 MED ORDER — PREDNISONE 20 MG PO TABS: ORAL_TABLET | ORAL | 0 refills | Status: DC

## 2018-12-12 NOTE — Patient Instructions (Signed)
Return to office for any scheduled appointments. Call the office or go to the MAU at [facility] if:  You begin to have strong, frequent contractions  Your water breaks.  Sometimes it is a big gush of fluid, sometimes it is just a trickle that keeps getting your panties wet or running down your legs  You have vaginal bleeding.  It is normal to have a small amount of spotting if your cervix was checked.   You do not feel your baby moving like normal.  If you do not, get something to eat and drink and lay down and focus on feeling your baby move.   If your baby is still not moving like normal, you should call the office or go to MAU.  Any other obstetric concerns.   
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
## 2018-12-12 NOTE — Progress Notes (Addendum)
Pt having trouble breathing from asthma. O2 sat 100. Respirations 30. Audible wheezing. Dr Gillian Scarce aware. Pt unable to complete Medicaid home Form. Advised by MD to go to [facility] from clinic for asthma symptoms.

## 2018-12-12 NOTE — ED Triage Notes (Signed)
Pt states that she became [facility] after doing yard work outside 2 days ago. Pt has not had inhaler x2 weeks, but has had nebulizer. Pt is approximately [redacted] weeks pregnant.

## 2018-12-13 LAB — NOVEL CORONAVIRUS, NAA (HOSP ORDER, SEND-OUT TO REF LAB; TAT 18-24 HRS): SARS-CoV-2, NAA: NOT DETECTED

## 2018-12-14 ENCOUNTER — Encounter: Payer: Self-pay | Admitting: *Deleted

## 2018-12-14 ENCOUNTER — Encounter: Payer: Self-pay | Admitting: Obstetrics and Gynecology

## 2018-12-14 NOTE — Progress Notes (Signed)
COVID Hotel Screening performed. Temperature, PHQ-9, and need for medical care and medications assessed. No additional needs assessed at this time.   Tamara Bradford

## 2018-12-19 DIAGNOSIS — O43213 Placenta accreta, third trimester: Secondary | ICD-10-CM | POA: Insufficient documentation

## 2018-12-23 NOTE — Progress Notes (Signed)
COVID Hotel Screening performed. Temperature, PHQ-9, and need for medical care and medications assessed. No additional needs assessed at this time.  Temara Lanum RN MSN 

## 2018-12-25 ENCOUNTER — Telehealth: Payer: Self-pay | Admitting: *Deleted

## 2018-12-25 DIAGNOSIS — Z20822 Contact with and (suspected) exposure to covid-19: Secondary | ICD-10-CM

## 2018-12-25 NOTE — Telephone Encounter (Signed)
Order placed for COVID-19 testing.  

## 2018-12-31 ENCOUNTER — Encounter: Payer: Self-pay | Admitting: *Deleted

## 2019-01-02 NOTE — Progress Notes (Signed)
COVID Hotel Screening performed. Temperature, PHQ-9, and need for medical care and medications assessed. No additional needs assessed at this time.  Lurline Caver RN MSN 

## 2019-01-09 ENCOUNTER — Ambulatory Visit (HOSPITAL_COMMUNITY): Payer: Medicaid Other | Attending: Maternal & Fetal Medicine

## 2019-01-09 ENCOUNTER — Ambulatory Visit (HOSPITAL_COMMUNITY): Admission: RE | Admit: 2019-01-09 | Payer: Medicaid Other | Source: Ambulatory Visit

## 2019-01-09 ENCOUNTER — Encounter (HOSPITAL_COMMUNITY): Payer: Self-pay

## 2019-01-11 NOTE — Progress Notes (Signed)
COVID Hotel Screening performed. Temperature, PHQ-9, and need for medical care and medications assessed. No additional needs assessed at this time.  Tamara Bressman  MSN, RN 

## 2019-01-12 ENCOUNTER — Emergency Department (HOSPITAL_COMMUNITY)
Admission: EM | Admit: 2019-01-12 | Discharge: 2019-01-12 | Disposition: A | Discharge status: Home / Self Care | Payer: Medicaid Other | Attending: Emergency Medicine | Admitting: Emergency Medicine

## 2019-01-12 ENCOUNTER — Emergency Department (HOSPITAL_COMMUNITY): Payer: Medicaid Other

## 2019-01-12 ENCOUNTER — Encounter (HOSPITAL_COMMUNITY): Payer: Self-pay | Admitting: Pharmacy Technician

## 2019-01-12 ENCOUNTER — Other Ambulatory Visit: Payer: Self-pay

## 2019-01-12 DIAGNOSIS — Z79899 Other long term (current) drug therapy: Secondary | ICD-10-CM | POA: Insufficient documentation

## 2019-01-12 DIAGNOSIS — F17210 Nicotine dependence, cigarettes, uncomplicated: Secondary | ICD-10-CM | POA: Insufficient documentation

## 2019-01-12 DIAGNOSIS — O99332 Smoking (tobacco) complicating pregnancy, second trimester: Secondary | ICD-10-CM | POA: Diagnosis not present

## 2019-01-12 DIAGNOSIS — Z3492 Encounter for supervision of normal pregnancy, unspecified, second trimester: Secondary | ICD-10-CM

## 2019-01-12 DIAGNOSIS — O99511 Diseases of the respiratory system complicating pregnancy, first trimester: Secondary | ICD-10-CM | POA: Diagnosis not present

## 2019-01-12 DIAGNOSIS — Z20828 Contact with and (suspected) exposure to other viral communicable diseases: Secondary | ICD-10-CM | POA: Insufficient documentation

## 2019-01-12 DIAGNOSIS — Z7982 Long term (current) use of aspirin: Secondary | ICD-10-CM | POA: Insufficient documentation

## 2019-01-12 DIAGNOSIS — J4542 Moderate persistent asthma with status asthmaticus: Secondary | ICD-10-CM | POA: Insufficient documentation

## 2019-01-12 DIAGNOSIS — Z3A26 26 weeks gestation of pregnancy: Secondary | ICD-10-CM | POA: Insufficient documentation

## 2019-01-12 DIAGNOSIS — J4521 Mild intermittent asthma with (acute) exacerbation: Secondary | ICD-10-CM

## 2019-01-12 LAB — COMPREHENSIVE METABOLIC PANEL
ALT: 13 U/L (ref 0–44)
AST: 18 U/L (ref 15–41)
Albumin: 2.5 g/dL — ABNORMAL LOW (ref 3.5–5.0)
Alkaline Phosphatase: 126 U/L (ref 38–126)
Anion gap: 9 (ref 5–15)
BUN: <5 mg/dL — ABNORMAL LOW (ref 6–20)
CO2: 16 mmol/L — ABNORMAL LOW (ref 22–32)
Calcium: 8.4 mg/dL — ABNORMAL LOW (ref 8.9–10.3)
Chloride: 113 mmol/L — ABNORMAL HIGH (ref 98–111)
Creatinine, Ser: 0.66 mg/dL (ref 0.44–1.00)
GFR calc Af Amer: >60 mL/min (ref >60)
GFR calc non Af Amer: >60 mL/min (ref >60)
Glucose, Bld: 138 mg/dL — ABNORMAL HIGH (ref 70–99)
Potassium: 3.2 mmol/L — ABNORMAL LOW (ref 3.5–5.1)
Sodium: 138 mmol/L (ref 135–145)
Total Bilirubin: 0.8 mg/dL (ref 0.3–1.2)
Total Protein: 6 g/dL — ABNORMAL LOW (ref 6.5–8.1)

## 2019-01-12 LAB — CBC
HCT: 32.3 % — ABNORMAL LOW (ref 36.0–46.0)
Hemoglobin: 10.3 g/dL — ABNORMAL LOW (ref 12.0–15.0)
MCH: 28.5 pg (ref 26.0–34.0)
MCHC: 31.9 g/dL (ref 30.0–36.0)
MCV: 89.2 fL (ref 80.0–100.0)
Platelets: 271 10*3/uL (ref 150–400)
RBC: 3.62 MIL/uL — ABNORMAL LOW (ref 3.87–5.11)
RDW: 14.5 % (ref 11.5–15.5)
WBC: 6.3 10*3/uL (ref 4.0–10.5)
nRBC: 0 % (ref 0.0–0.2)

## 2019-01-12 LAB — SARS CORONAVIRUS 2: SARS Coronavirus 2: NOT DETECTED

## 2019-01-12 MED ORDER — ALBUTEROL SULFATE (2.5 MG/3ML) 0.083% IN NEBU: 2.5000 mg | INHALATION_SOLUTION | Freq: Four times a day (QID) | RESPIRATORY_TRACT | 0 refills | Status: DC | PRN

## 2019-01-12 MED ORDER — ALBUTEROL SULFATE (2.5 MG/3ML) 0.083% IN NEBU: 5.0000 mg | INHALATION_SOLUTION | RESPIRATORY_TRACT | Status: DC | PRN

## 2019-01-12 MED ORDER — SODIUM CHLORIDE 0.9 % IV BOLUS
1000.0000 mL | Freq: Once | INTRAVENOUS | Status: AC
  Administered 2019-01-12: 13:00:00 1000 mL via INTRAVENOUS

## 2019-01-12 MED ORDER — PREDNISONE 20 MG PO TABS: ORAL_TABLET | ORAL | 0 refills | Status: DC

## 2019-01-12 MED ORDER — MAGNESIUM SULFATE 2 GM/50ML IV SOLN
2.0000 g | Freq: Once | INTRAVENOUS | Status: AC
  Administered 2019-01-12: 2 g via INTRAVENOUS
  Filled 2019-01-12: qty 50

## 2019-01-12 MED ORDER — ALBUTEROL SULFATE HFA 108 (90 BASE) MCG/ACT IN AERS
2.0000 | INHALATION_SPRAY | RESPIRATORY_TRACT | Status: DC | PRN
  Administered 2019-01-12: 16:00:00 2 via RESPIRATORY_TRACT

## 2019-01-12 MED ORDER — ALBUTEROL (5 MG/ML) CONTINUOUS INHALATION SOLN
15.0000 mg/h | INHALATION_SOLUTION | Freq: Once | RESPIRATORY_TRACT | Status: AC
  Administered 2019-01-12: 16:00:00 15 mg/h via RESPIRATORY_TRACT
  Filled 2019-01-12: qty 20

## 2019-01-12 MED ORDER — ALBUTEROL SULFATE HFA 108 (90 BASE) MCG/ACT IN AERS
4.0000 | INHALATION_SPRAY | Freq: Once | RESPIRATORY_TRACT | Status: AC
  Administered 2019-01-12: 4 via RESPIRATORY_TRACT
  Filled 2019-01-12: qty 6.7

## 2019-01-12 MED ORDER — METHYLPREDNISOLONE SODIUM SUCC 125 MG IJ SOLR
125.0000 mg | Freq: Once | INTRAMUSCULAR | Status: AC
  Administered 2019-01-12: 13:00:00 125 mg via INTRAVENOUS
  Filled 2019-01-12: qty 2

## 2019-01-12 MED ORDER — AEROCHAMBER PLUS FLO-VU MEDIUM MISC
1.0000 | Freq: Once | Status: DC
  Filled 2019-01-12: qty 1

## 2019-01-12 NOTE — Progress Notes (Signed)
Spoke with Dr. Elly Modena. Pt is a G19 P16 at [redacted] weeks gestation presenting with shortness of breath. Pt has a hx of asthma. No vaginal bleeding or leaking of fluid. FHR tracing Baseline 155, moderate variability, 15x15 accels, no decels. Pt has received 830ml of NS so far. She has been given Solumedrol and an albuterol inhaler. CBC and CMET has been done. Covid test is still pending. Chest xray shows that her lungs are clear. EKG shows sinus rhythem. Obstetrically she is stable and the ED MD can contact Dr. Elly Modena at [phone] for any concerns.

## 2019-01-12 NOTE — ED Notes (Signed)
Respiratory notified of albuterol neb

## 2019-01-12 NOTE — Progress Notes (Signed)
Dr. Jeanell Sparrow notified that pt is obstetrically stable and we are signing off. Dr. Elly Modena can be reached at [phone].

## 2019-01-12 NOTE — Discharge Instructions (Addendum)
Take prednisone as prescribed.   Use albuterol every 4 hrs as needed for wheezing or cough   See your doctor in a week   Return to ER if you have worse wheezing, cough, fever, shortness of breath, vaginal bleeding, contractions
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
## 2019-01-12 NOTE — ED Triage Notes (Addendum)
Pt arrives pov with increasing sob X2 days. Hx asthma and states lost her inhaler. Pt visibly sob.  Pt [redacted] weeks pregnant.

## 2019-01-12 NOTE — ED Provider Notes (Signed)
  Physical Exam  BP 131/73 (BP Location: Right Arm)   Pulse (!) 109   Temp 97.9 F (36.6 C) (Oral)   Resp (!) 21   Ht 5\' 7"  (1.702 m)   LMP 06/05/2018   SpO2 98%   BMI 25.21 kg/m   Physical Exam  ED Course/Procedures     Procedures  MDM  Care assumed at 3 pm from Dr. Jeanell Sparrow. Patient is [redacted] weeks pregnant and has hx of asthma here with SOB, wheezing. Was thought to have asthma exacerbation. Given albuterol MDI, steroids, magnesium. Cleared by OB. COVID pending and likely need nebs and reassessment   5:06 PM COVID negative. Given continuous nebs and now felt much better. Slightly tachycardic likely from nebs. Only minimal wheezing now and no retractions. Not hypoxic. Stable for discharge home with albuterol, prednisone taper.      Drenda Freeze, MD 01/12/19 [phone]

## 2019-01-12 NOTE — Progress Notes (Signed)
Pt is a G19 P16 at [redacted] weeks gestation. She presents today with c/o shortness of breath. She gets her care at [facility]. She says she was being seen at [facility] hospital but was transferred to [facility] because she has a placenta acreta. Her last five babies were delivered by cesarean section. Her youngest child is 22 old. I am waiting for the pt to have a covid test and a chest x-ray before placing her on the fetal monitor.

## 2019-01-20 NOTE — Progress Notes (Signed)
COVID Hotel Screening performed. Temperature, PHQ-9, and need for medical care and medications assessed. No additional needs assessed at this time.  Pallie Swigert  MSN, RN 

## 2019-01-21 ENCOUNTER — Other Ambulatory Visit: Payer: Self-pay

## 2019-01-21 ENCOUNTER — Emergency Department (HOSPITAL_COMMUNITY)
Admission: EM | Admit: 2019-01-21 | Discharge: 2019-01-21 | Discharge status: Against Medical Advice | Payer: Medicaid Other | Attending: Emergency Medicine | Admitting: Emergency Medicine

## 2019-01-21 ENCOUNTER — Emergency Department (HOSPITAL_COMMUNITY): Payer: Medicaid Other

## 2019-01-21 ENCOUNTER — Encounter (HOSPITAL_COMMUNITY): Payer: Self-pay | Admitting: Emergency Medicine

## 2019-01-21 DIAGNOSIS — Z7982 Long term (current) use of aspirin: Secondary | ICD-10-CM | POA: Diagnosis not present

## 2019-01-21 DIAGNOSIS — Z5329 Procedure and treatment not carried out because of patient's decision for other reasons: Secondary | ICD-10-CM | POA: Diagnosis not present

## 2019-01-21 DIAGNOSIS — Z20828 Contact with and (suspected) exposure to other viral communicable diseases: Secondary | ICD-10-CM | POA: Diagnosis not present

## 2019-01-21 DIAGNOSIS — F17210 Nicotine dependence, cigarettes, uncomplicated: Secondary | ICD-10-CM | POA: Diagnosis not present

## 2019-01-21 DIAGNOSIS — Z3A27 27 weeks gestation of pregnancy: Secondary | ICD-10-CM | POA: Diagnosis not present

## 2019-01-21 DIAGNOSIS — O99333 Smoking (tobacco) complicating pregnancy, third trimester: Secondary | ICD-10-CM | POA: Diagnosis not present

## 2019-01-21 DIAGNOSIS — O9989 Other specified diseases and conditions complicating pregnancy, childbirth and the puerperium: Secondary | ICD-10-CM | POA: Insufficient documentation

## 2019-01-21 DIAGNOSIS — Z79899 Other long term (current) drug therapy: Secondary | ICD-10-CM | POA: Insufficient documentation

## 2019-01-21 DIAGNOSIS — J4541 Moderate persistent asthma with (acute) exacerbation: Secondary | ICD-10-CM | POA: Insufficient documentation

## 2019-01-21 LAB — CBC WITH DIFFERENTIAL/PLATELET
Abs Immature Granulocytes: 0.04 10*3/uL (ref 0.00–0.07)
Basophils Absolute: 0 10*3/uL (ref 0.0–0.1)
Basophils Relative: 0 %
Eosinophils Absolute: 0.6 10*3/uL — ABNORMAL HIGH (ref 0.0–0.5)
Eosinophils Relative: 7 %
HCT: 32.2 % — ABNORMAL LOW (ref 36.0–46.0)
Hemoglobin: 10.4 g/dL — ABNORMAL LOW (ref 12.0–15.0)
Immature Granulocytes: 1 %
Lymphocytes Relative: 27 %
Lymphs Abs: 2.4 10*3/uL (ref 0.7–4.0)
MCH: 28.2 pg (ref 26.0–34.0)
MCHC: 32.3 g/dL (ref 30.0–36.0)
MCV: 87.3 fL (ref 80.0–100.0)
Monocytes Absolute: 0.9 10*3/uL (ref 0.1–1.0)
Monocytes Relative: 11 %
Neutro Abs: 4.7 10*3/uL (ref 1.7–7.7)
Neutrophils Relative %: 54 %
Platelets: 256 10*3/uL (ref 150–400)
RBC: 3.69 MIL/uL — ABNORMAL LOW (ref 3.87–5.11)
RDW: 14.3 % (ref 11.5–15.5)
WBC: 8.7 10*3/uL (ref 4.0–10.5)
nRBC: 0 % (ref 0.0–0.2)

## 2019-01-21 LAB — BASIC METABOLIC PANEL
Anion gap: 10 (ref 5–15)
BUN: 6 mg/dL (ref 6–20)
CO2: 22 mmol/L (ref 22–32)
Calcium: 8.9 mg/dL (ref 8.9–10.3)
Chloride: 107 mmol/L (ref 98–111)
Creatinine, Ser: 0.71 mg/dL (ref 0.44–1.00)
GFR calc Af Amer: >60 mL/min (ref >60)
GFR calc non Af Amer: >60 mL/min (ref >60)
Glucose, Bld: 117 mg/dL — ABNORMAL HIGH (ref 70–99)
Potassium: 3.7 mmol/L (ref 3.5–5.1)
Sodium: 139 mmol/L (ref 135–145)

## 2019-01-21 LAB — SARS CORONAVIRUS 2 BY RT PCR (HOSPITAL ORDER, PERFORMED IN ~~LOC~~ HOSPITAL LAB): SARS Coronavirus 2: NEGATIVE

## 2019-01-21 MED ORDER — LORAZEPAM 2 MG/ML IJ SOLN
0.5000 mg | Freq: Once | INTRAMUSCULAR | Status: DC
  Filled 2019-01-21: qty 1

## 2019-01-21 MED ORDER — IPRATROPIUM-ALBUTEROL 0.5-2.5 (3) MG/3ML IN SOLN
3.0000 mL | Freq: Once | RESPIRATORY_TRACT | Status: AC
  Administered 2019-01-21: 3 mL via RESPIRATORY_TRACT
  Filled 2019-01-21: qty 3

## 2019-01-21 MED ORDER — PREDNISONE 20 MG PO TABS: ORAL_TABLET | ORAL | 0 refills | Status: DC

## 2019-01-21 MED ORDER — ALBUTEROL SULFATE (2.5 MG/3ML) 0.083% IN NEBU
5.0000 mg | INHALATION_SOLUTION | Freq: Once | RESPIRATORY_TRACT | Status: AC
  Administered 2019-01-21: 5 mg via RESPIRATORY_TRACT
  Filled 2019-01-21: qty 6

## 2019-01-21 MED ORDER — METHYLPREDNISOLONE SODIUM SUCC 125 MG IJ SOLR
125.0000 mg | Freq: Once | INTRAMUSCULAR | Status: AC
  Administered 2019-01-21: 08:00:00 125 mg via INTRAVENOUS
  Filled 2019-01-21: qty 2

## 2019-01-21 MED ORDER — MAGNESIUM SULFATE 2 GM/50ML IV SOLN
2.0000 g | Freq: Once | INTRAVENOUS | Status: AC
  Administered 2019-01-21: 2 g via INTRAVENOUS
  Filled 2019-01-21: qty 50

## 2019-01-21 MED ORDER — ALBUTEROL SULFATE HFA 108 (90 BASE) MCG/ACT IN AERS
5.0000 | INHALATION_SPRAY | Freq: Once | RESPIRATORY_TRACT | Status: AC
  Administered 2019-01-21: 08:00:00 5 via RESPIRATORY_TRACT
  Filled 2019-01-21: qty 6.7

## 2019-01-21 NOTE — ED Notes (Signed)
Pt urged to stay to complete treatment or return at any time

## 2019-01-21 NOTE — ED Notes (Addendum)
Pt refuses ativan at present. Rapid ob at bedside and toco monitor applied.

## 2019-01-21 NOTE — ED Notes (Signed)
Pt ambulates to bathroom with steadyn gait but tachypneic and l;abored. Pt urtged to stay for trteatment./
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
## 2019-01-21 NOTE — Discharge Instructions (Addendum)
It is important for you to take the steroids as directed and use your inhalers and nebulizer treatments. Please return to the ED if you start to have worsening symptoms, trouble breathing, chest pain or leg swelling.

## 2019-01-21 NOTE — ED Notes (Signed)
Rapid OB called  

## 2019-01-21 NOTE — ED Triage Notes (Signed)
Pt. Stated, I don't have an inhaler the pharmacy didn't fill it. Im SOB started yesterday.
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
## 2019-01-24 DIAGNOSIS — O1493 Unspecified pre-eclampsia, third trimester: Secondary | ICD-10-CM | POA: Insufficient documentation

## 2019-01-27 NOTE — Progress Notes (Signed)
COVID Hotel Screening performed. Temperature, PHQ-9, and need for medical care and medications assessed. No additional needs assessed at this time.  Patina Spanier  MSN, RN 

## 2019-02-07 DIAGNOSIS — O99343 Other mental disorders complicating pregnancy, third trimester: Secondary | ICD-10-CM | POA: Insufficient documentation

## 2019-02-07 DIAGNOSIS — F329 Major depressive disorder, single episode, unspecified: Secondary | ICD-10-CM | POA: Insufficient documentation

## 2019-02-08 ENCOUNTER — Other Ambulatory Visit: Payer: Self-pay | Admitting: Internal Medicine

## 2019-02-14 LAB — NOVEL CORONAVIRUS, NAA: SARS-CoV-2, NAA: NOT DETECTED

## 2019-02-16 MED ORDER — GENERIC EXTERNAL MEDICATION
Status: DC
Start: ? — End: 2019-02-16

## 2019-02-16 MED ORDER — DIPHENHYDRAMINE HCL 25 MG PO CAPS
25.00 | ORAL_CAPSULE | ORAL | Status: DC
Start: ? — End: 2019-02-16

## 2019-02-16 MED ORDER — SALINE NASAL SPRAY 0.65 % NA SOLN
2.00 | NASAL | Status: DC
Start: ? — End: 2019-02-16

## 2019-02-16 MED ORDER — BUDESONIDE-FORMOTEROL FUMARATE 160-4.5 MCG/ACT IN AERO
2.00 | INHALATION_SPRAY | RESPIRATORY_TRACT | Status: DC
Start: 2019-02-16 — End: 2019-02-16

## 2019-02-16 MED ORDER — VARICELLA VIRUS VACCINE LIVE 1350 PFU/0.5ML ~~LOC~~ INJ
0.50 | INJECTION | SUBCUTANEOUS | Status: DC
Start: ? — End: 2019-02-16

## 2019-02-16 MED ORDER — BENZOCAINE-MENTHOL 6-10 MG MT LOZG
1.00 | LOZENGE | OROMUCOSAL | Status: DC
Start: ? — End: 2019-02-16

## 2019-02-16 MED ORDER — TETANUS-DIPHTH-ACELL PERTUSSIS 5-2.5-18.5 LF-MCG/0.5 IM SUSP
0.50 | INTRAMUSCULAR | Status: DC
Start: ? — End: 2019-02-16

## 2019-02-16 MED ORDER — GUAIFENESIN 100 MG/5ML PO SYRP
200.00 | ORAL_SOLUTION | ORAL | Status: DC
Start: ? — End: 2019-02-16

## 2019-02-16 MED ORDER — FAMOTIDINE 20 MG PO TABS
20.00 | ORAL_TABLET | ORAL | Status: DC
Start: ? — End: 2019-02-16

## 2019-02-16 MED ORDER — TIOTROPIUM BROMIDE MONOHYDRATE 18 MCG IN CAPS
1.00 | ORAL_CAPSULE | RESPIRATORY_TRACT | Status: DC
Start: 2019-02-16 — End: 2019-02-16

## 2019-02-16 MED ORDER — WITCH HAZEL-GLYCERIN EX PADS
1.00 | MEDICATED_PAD | CUTANEOUS | Status: DC
Start: ? — End: 2019-02-16

## 2019-02-16 MED ORDER — LACTATED RINGERS IV SOLN
INTRAVENOUS | Status: DC
Start: ? — End: 2019-02-16

## 2019-02-16 MED ORDER — DOCUSATE SODIUM 100 MG PO CAPS
100.00 | ORAL_CAPSULE | ORAL | Status: DC
Start: ? — End: 2019-02-16

## 2019-02-16 MED ORDER — HYDROCORTISONE 1 % EX OINT
TOPICAL_OINTMENT | CUTANEOUS | Status: DC
Start: 2019-02-16 — End: 2019-02-16

## 2019-02-16 MED ORDER — BISACODYL 10 MG RE SUPP
10.00 | RECTAL | Status: DC
Start: ? — End: 2019-02-16

## 2019-02-16 MED ORDER — NICOTINE 21 MG/24HR TD PT24
1.00 | MEDICATED_PATCH | TRANSDERMAL | Status: DC
Start: 2019-02-16 — End: 2019-02-16

## 2019-02-16 MED ORDER — BENZOCAINE-MENTHOL 20-0.5 % EX AERO
1.00 | INHALATION_SPRAY | CUTANEOUS | Status: DC
Start: ? — End: 2019-02-16

## 2019-02-16 MED ORDER — NALOXONE HCL 0.4 MG/ML IJ SOLN
0.10 | INTRAMUSCULAR | Status: DC
Start: ? — End: 2019-02-16

## 2019-02-16 MED ORDER — MORPHINE SULFATE 2 MG/ML IJ SOLN
2.00 | INTRAMUSCULAR | Status: DC
Start: ? — End: 2019-02-16

## 2019-02-16 MED ORDER — GABAPENTIN 300 MG PO CAPS
300.00 | ORAL_CAPSULE | ORAL | Status: DC
Start: 2019-02-16 — End: 2019-02-16

## 2019-02-16 MED ORDER — SIMETHICONE 80 MG PO CHEW
80.00 | CHEWABLE_TABLET | ORAL | Status: DC
Start: ? — End: 2019-02-16

## 2019-02-16 MED ORDER — MAGNESIUM HYDROXIDE 400 MG/5ML PO SUSP
30.00 | ORAL | Status: DC
Start: ? — End: 2019-02-16

## 2019-02-16 MED ORDER — SENNOSIDES-DOCUSATE SODIUM 8.6-50 MG PO TABS
1.00 | ORAL_TABLET | ORAL | Status: DC
Start: 2019-02-16 — End: 2019-02-16

## 2019-02-16 MED ORDER — ENOXAPARIN SODIUM 40 MG/0.4ML ~~LOC~~ SOLN
40.00 | SUBCUTANEOUS | Status: DC
Start: 2019-02-16 — End: 2019-02-16

## 2019-02-16 MED ORDER — ALUMINUM-MAGNESIUM-SIMETHICONE 200-200-20 MG/5ML PO SUSP
30.00 | ORAL | Status: DC
Start: ? — End: 2019-02-16

## 2019-02-16 MED ORDER — ALBUTEROL SULFATE (2.5 MG/3ML) 0.083% IN NEBU
2.50 | INHALATION_SOLUTION | RESPIRATORY_TRACT | Status: DC
Start: ? — End: 2019-02-16

## 2019-02-16 MED ORDER — ENEMA 7-19 GM/118ML RE ENEM
1.00 | ENEMA | RECTAL | Status: DC
Start: ? — End: 2019-02-16

## 2019-02-16 MED ORDER — PRENATAL 28-0.8 MG PO TABS
1.00 | ORAL_TABLET | ORAL | Status: DC
Start: 2019-02-16 — End: 2019-02-16

## 2019-02-16 MED ORDER — MEASLES, MUMPS & RUBELLA VAC IJ SOLR
0.50 | INTRAMUSCULAR | Status: DC
Start: ? — End: 2019-02-16

## 2019-02-16 MED ORDER — SERTRALINE HCL 50 MG PO TABS
50.00 | ORAL_TABLET | ORAL | Status: DC
Start: 2019-02-16 — End: 2019-02-16

## 2019-02-16 MED ORDER — MONTELUKAST SODIUM 10 MG PO TABS
10.00 | ORAL_TABLET | ORAL | Status: DC
Start: 2019-02-16 — End: 2019-02-16

## 2019-02-16 MED ORDER — OXYCODONE HCL 5 MG PO TABS
5.00 | ORAL_TABLET | ORAL | Status: DC
Start: ? — End: 2019-02-16

## 2019-02-16 MED ORDER — IBUPROFEN 400 MG PO TABS
800.00 | ORAL_TABLET | ORAL | Status: DC
Start: 2019-02-16 — End: 2019-02-16

## 2019-02-16 MED ORDER — OXYCODONE HCL 5 MG PO TABS
10.00 | ORAL_TABLET | ORAL | Status: DC
Start: ? — End: 2019-02-16

## 2019-03-24 NOTE — Progress Notes (Signed)
COVID-19 Screening performed. Temperature, PHQ-9, and need for medical care and medications assessed. Pt refused referral to  [facility]   Jobe Igo MSN, RN

## 2019-03-31 NOTE — Progress Notes (Signed)
COVID-19 Screening performed. Temperature, PHQ-9, and need for medical care and medications assessed.Pt referred for medication assistance.    Laurna Shetley MSN, RN 

## 2019-04-02 ENCOUNTER — Encounter: Payer: Self-pay | Admitting: Podiatry

## 2019-04-02 ENCOUNTER — Ambulatory Visit: Payer: Medicaid Other | Admitting: Podiatry

## 2019-04-02 ENCOUNTER — Other Ambulatory Visit: Payer: Self-pay

## 2019-04-02 ENCOUNTER — Ambulatory Visit (INDEPENDENT_AMBULATORY_CARE_PROVIDER_SITE_OTHER): Payer: Medicaid Other

## 2019-04-02 VITALS — BP 153/93

## 2019-04-02 DIAGNOSIS — M79671 Pain in right foot: Secondary | ICD-10-CM

## 2019-04-02 DIAGNOSIS — M216X2 Other acquired deformities of left foot: Secondary | ICD-10-CM | POA: Diagnosis not present

## 2019-04-02 DIAGNOSIS — M216X1 Other acquired deformities of right foot: Secondary | ICD-10-CM | POA: Diagnosis not present

## 2019-04-02 DIAGNOSIS — R21 Rash and other nonspecific skin eruption: Secondary | ICD-10-CM

## 2019-04-02 DIAGNOSIS — B353 Tinea pedis: Secondary | ICD-10-CM

## 2019-04-02 DIAGNOSIS — M79672 Pain in left foot: Secondary | ICD-10-CM

## 2019-04-02 MED ORDER — KETOCONAZOLE 2 % EX CREA: 1.0000 "application " | TOPICAL_CREAM | Freq: Every day | CUTANEOUS | 0 refills | Status: DC

## 2019-04-02 MED ORDER — TRIAMCINOLONE ACETONIDE 0.1 % EX CREA: 1.0000 "application " | TOPICAL_CREAM | Freq: Every day | CUTANEOUS | 0 refills | Status: DC

## 2019-04-09 ENCOUNTER — Encounter: Payer: Self-pay | Admitting: Emergency Medicine

## 2019-04-09 ENCOUNTER — Emergency Department (HOSPITAL_COMMUNITY): Payer: Medicaid Other

## 2019-04-09 ENCOUNTER — Emergency Department (HOSPITAL_COMMUNITY)
Admission: EM | Admit: 2019-04-09 | Discharge: 2019-04-09 | Payer: Medicaid Other | Attending: Emergency Medicine | Admitting: Emergency Medicine

## 2019-04-09 DIAGNOSIS — F17210 Nicotine dependence, cigarettes, uncomplicated: Secondary | ICD-10-CM | POA: Insufficient documentation

## 2019-04-09 DIAGNOSIS — J45909 Unspecified asthma, uncomplicated: Secondary | ICD-10-CM | POA: Diagnosis not present

## 2019-04-09 DIAGNOSIS — Z79899 Other long term (current) drug therapy: Secondary | ICD-10-CM | POA: Insufficient documentation

## 2019-04-09 DIAGNOSIS — R0602 Shortness of breath: Secondary | ICD-10-CM | POA: Insufficient documentation

## 2019-04-09 NOTE — Discharge Instructions (Signed)
It was my pleasure taking care of you today!   Use inhaler as needed for shortness of breath.   Follow up with your primary care doctor.  Return to ER for new or worsening symptoms, any additional concerns.
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
## 2019-04-09 NOTE — ED Triage Notes (Signed)
When speaking with patient, she denies chest pain and states she a headache.

## 2019-04-09 NOTE — ED Notes (Signed)
Pt stated that she was short of breath, but is not any longer.
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
## 2019-05-02 ENCOUNTER — Emergency Department (HOSPITAL_COMMUNITY)
Admission: EM | Admit: 2019-05-02 | Discharge: 2019-05-02 | Discharge status: Against Medical Advice | Payer: Medicaid Other | Attending: Emergency Medicine | Admitting: Emergency Medicine

## 2019-05-02 ENCOUNTER — Emergency Department (HOSPITAL_COMMUNITY): Payer: Medicaid Other

## 2019-05-02 DIAGNOSIS — J45909 Unspecified asthma, uncomplicated: Secondary | ICD-10-CM | POA: Insufficient documentation

## 2019-05-02 DIAGNOSIS — Z5329 Procedure and treatment not carried out because of patient's decision for other reasons: Secondary | ICD-10-CM | POA: Insufficient documentation

## 2019-05-02 DIAGNOSIS — R079 Chest pain, unspecified: Secondary | ICD-10-CM

## 2019-05-02 DIAGNOSIS — Y999 Unspecified external cause status: Secondary | ICD-10-CM | POA: Insufficient documentation

## 2019-05-02 DIAGNOSIS — Y929 Unspecified place or not applicable: Secondary | ICD-10-CM | POA: Insufficient documentation

## 2019-05-02 DIAGNOSIS — Z7982 Long term (current) use of aspirin: Secondary | ICD-10-CM | POA: Diagnosis not present

## 2019-05-02 DIAGNOSIS — R110 Nausea: Secondary | ICD-10-CM | POA: Diagnosis not present

## 2019-05-02 DIAGNOSIS — R0781 Pleurodynia: Secondary | ICD-10-CM | POA: Insufficient documentation

## 2019-05-02 DIAGNOSIS — Z79899 Other long term (current) drug therapy: Secondary | ICD-10-CM | POA: Diagnosis not present

## 2019-05-02 DIAGNOSIS — Y9389 Activity, other specified: Secondary | ICD-10-CM | POA: Diagnosis not present

## 2019-05-02 DIAGNOSIS — F419 Anxiety disorder, unspecified: Secondary | ICD-10-CM | POA: Diagnosis not present

## 2019-05-02 DIAGNOSIS — R0602 Shortness of breath: Secondary | ICD-10-CM | POA: Insufficient documentation

## 2019-05-02 DIAGNOSIS — F17210 Nicotine dependence, cigarettes, uncomplicated: Secondary | ICD-10-CM | POA: Diagnosis not present

## 2019-05-02 DIAGNOSIS — F329 Major depressive disorder, single episode, unspecified: Secondary | ICD-10-CM | POA: Insufficient documentation

## 2019-05-02 DIAGNOSIS — R1030 Lower abdominal pain, unspecified: Secondary | ICD-10-CM | POA: Diagnosis not present

## 2019-05-02 DIAGNOSIS — S20221A Contusion of right back wall of thorax, initial encounter: Secondary | ICD-10-CM | POA: Diagnosis not present

## 2019-05-02 DIAGNOSIS — K5900 Constipation, unspecified: Secondary | ICD-10-CM | POA: Diagnosis not present

## 2019-05-02 DIAGNOSIS — R0789 Other chest pain: Secondary | ICD-10-CM | POA: Diagnosis not present

## 2019-05-02 DIAGNOSIS — R2232 Localized swelling, mass and lump, left upper limb: Secondary | ICD-10-CM | POA: Diagnosis not present

## 2019-05-02 DIAGNOSIS — S299XXA Unspecified injury of thorax, initial encounter: Secondary | ICD-10-CM | POA: Diagnosis present

## 2019-05-02 LAB — CBC WITH DIFFERENTIAL/PLATELET
Abs Immature Granulocytes: 0.02 10*3/uL (ref 0.00–0.07)
Basophils Absolute: 0 10*3/uL (ref 0.0–0.1)
Basophils Relative: 1 %
Eosinophils Absolute: 0.3 10*3/uL (ref 0.0–0.5)
Eosinophils Relative: 6 %
HCT: 30.9 % — ABNORMAL LOW (ref 36.0–46.0)
Hemoglobin: 8.9 g/dL — ABNORMAL LOW (ref 12.0–15.0)
Immature Granulocytes: 0 %
Lymphocytes Relative: 34 %
Lymphs Abs: 1.8 10*3/uL (ref 0.7–4.0)
MCH: 23.1 pg — ABNORMAL LOW (ref 26.0–34.0)
MCHC: 28.8 g/dL — ABNORMAL LOW (ref 30.0–36.0)
MCV: 80.3 fL (ref 80.0–100.0)
Monocytes Absolute: 0.5 10*3/uL (ref 0.1–1.0)
Monocytes Relative: 10 %
Neutro Abs: 2.6 10*3/uL (ref 1.7–7.7)
Neutrophils Relative %: 49 %
Platelets: 382 10*3/uL (ref 150–400)
RBC: 3.85 MIL/uL — ABNORMAL LOW (ref 3.87–5.11)
RDW: 15.9 % — ABNORMAL HIGH (ref 11.5–15.5)
WBC: 5.4 10*3/uL (ref 4.0–10.5)
nRBC: 0 % (ref 0.0–0.2)

## 2019-05-02 LAB — BASIC METABOLIC PANEL
Anion gap: 10 (ref 5–15)
BUN: 8 mg/dL (ref 6–20)
CO2: 21 mmol/L — ABNORMAL LOW (ref 22–32)
Calcium: 8.8 mg/dL — ABNORMAL LOW (ref 8.9–10.3)
Chloride: 106 mmol/L (ref 98–111)
Creatinine, Ser: 0.87 mg/dL (ref 0.44–1.00)
GFR calc Af Amer: >60 mL/min (ref >60)
GFR calc non Af Amer: >60 mL/min (ref >60)
Glucose, Bld: 96 mg/dL (ref 70–99)
Potassium: 3.6 mmol/L (ref 3.5–5.1)
Sodium: 137 mmol/L (ref 135–145)

## 2019-05-02 LAB — TROPONIN I (HIGH SENSITIVITY): Troponin I (High Sensitivity): 3 ng/L (ref <18)
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
## 2019-11-07 DIAGNOSIS — R0789 Other chest pain: Secondary | ICD-10-CM | POA: Diagnosis not present

## 2019-11-07 DIAGNOSIS — J80 Acute respiratory distress syndrome: Secondary | ICD-10-CM | POA: Diagnosis not present

## 2019-11-07 DIAGNOSIS — R069 Unspecified abnormalities of breathing: Secondary | ICD-10-CM | POA: Diagnosis not present

## 2019-11-07 DIAGNOSIS — R000 Tachycardia, unspecified: Secondary | ICD-10-CM | POA: Diagnosis not present

## 2019-11-07 DIAGNOSIS — R079 Chest pain, unspecified: Secondary | ICD-10-CM | POA: Diagnosis not present

## 2020-02-10 DIAGNOSIS — Z5181 Encounter for therapeutic drug level monitoring: Secondary | ICD-10-CM | POA: Diagnosis not present

## 2020-02-12 DIAGNOSIS — Z5181 Encounter for therapeutic drug level monitoring: Secondary | ICD-10-CM | POA: Diagnosis not present

## 2020-02-24 DIAGNOSIS — Z5181 Encounter for therapeutic drug level monitoring: Secondary | ICD-10-CM | POA: Diagnosis not present

## 2020-02-26 DIAGNOSIS — Z5181 Encounter for therapeutic drug level monitoring: Secondary | ICD-10-CM | POA: Diagnosis not present

## 2020-03-02 DIAGNOSIS — Z5181 Encounter for therapeutic drug level monitoring: Secondary | ICD-10-CM | POA: Diagnosis not present

## 2020-03-04 DIAGNOSIS — Z5181 Encounter for therapeutic drug level monitoring: Secondary | ICD-10-CM | POA: Diagnosis not present

## 2020-03-09 DIAGNOSIS — Z5181 Encounter for therapeutic drug level monitoring: Secondary | ICD-10-CM | POA: Diagnosis not present

## 2020-03-11 ENCOUNTER — Emergency Department (HOSPITAL_COMMUNITY)
Admission: EM | Admit: 2020-03-11 | Discharge: 2020-03-11 | Disposition: A | Discharge status: Home / Self Care | Payer: Medicaid Other | Attending: Emergency Medicine | Admitting: Emergency Medicine

## 2020-03-11 ENCOUNTER — Other Ambulatory Visit: Payer: Self-pay

## 2020-03-11 ENCOUNTER — Encounter (HOSPITAL_COMMUNITY): Payer: Self-pay

## 2020-03-11 DIAGNOSIS — Z87891 Personal history of nicotine dependence: Secondary | ICD-10-CM | POA: Insufficient documentation

## 2020-03-11 DIAGNOSIS — J45901 Unspecified asthma with (acute) exacerbation: Secondary | ICD-10-CM | POA: Insufficient documentation

## 2020-03-11 DIAGNOSIS — I10 Essential (primary) hypertension: Secondary | ICD-10-CM | POA: Diagnosis not present

## 2020-03-11 DIAGNOSIS — Z5181 Encounter for therapeutic drug level monitoring: Secondary | ICD-10-CM | POA: Diagnosis not present

## 2020-03-11 DIAGNOSIS — Z7951 Long term (current) use of inhaled steroids: Secondary | ICD-10-CM | POA: Insufficient documentation

## 2020-03-11 DIAGNOSIS — R000 Tachycardia, unspecified: Secondary | ICD-10-CM | POA: Diagnosis not present

## 2020-03-11 DIAGNOSIS — Z7982 Long term (current) use of aspirin: Secondary | ICD-10-CM | POA: Diagnosis not present

## 2020-03-11 DIAGNOSIS — R0689 Other abnormalities of breathing: Secondary | ICD-10-CM | POA: Diagnosis not present

## 2020-03-11 DIAGNOSIS — R0602 Shortness of breath: Secondary | ICD-10-CM | POA: Diagnosis present

## 2020-03-11 MED ORDER — PREDNISONE 10 MG PO TABS: 40.0000 mg | ORAL_TABLET | Freq: Every day | ORAL | 0 refills | Status: DC

## 2020-03-11 MED ORDER — ALBUTEROL SULFATE HFA 108 (90 BASE) MCG/ACT IN AERS
2.0000 | INHALATION_SPRAY | Freq: Four times a day (QID) | RESPIRATORY_TRACT | Status: DC
  Administered 2020-03-11: 2 via RESPIRATORY_TRACT
  Filled 2020-03-11: qty 6.7
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
## 2020-03-11 NOTE — Discharge Instructions (Signed)
Use albuterol inhaler 2 puffs every 6 hours for the next 2 weeks.  Take the prednisone as directed for the next 5 days.  You can continue to use the albuterol inhaler after 2 weeks as needed.  He can appointment to follow-up with the wellness clinic.  We will be able to transition you to other medications for your asthma.  Return for any new or worse symptoms.

## 2020-03-11 NOTE — ED Triage Notes (Signed)
Pt came via EMS for SOB. C/o SOB x 2 days. EMS gave breathing treatment yesteray but pt declined hospital visit. Called out again today. Gave 10mg  Albuterol, 0.5 Atrovent, 125MG  Solumedrol, and 2g of Mag.

## 2020-03-11 NOTE — ED Notes (Signed)
Pt reports tired of waiting. Ready to leave. Wants IV removed. Informed pt of how busy we were in the ED and that she will get seen soon by one of our ER physicians. Pt stated she just needs a prescription and she will wait a little longer.

## 2020-03-16 ENCOUNTER — Encounter (HOSPITAL_COMMUNITY): Payer: Self-pay

## 2020-03-16 ENCOUNTER — Emergency Department (HOSPITAL_COMMUNITY)
Admission: EM | Admit: 2020-03-16 | Discharge: 2020-03-17 | Disposition: A | Discharge status: Home / Self Care | Payer: Medicaid Other | Attending: Emergency Medicine | Admitting: Emergency Medicine

## 2020-03-16 ENCOUNTER — Other Ambulatory Visit: Payer: Self-pay

## 2020-03-16 DIAGNOSIS — R1031 Right lower quadrant pain: Secondary | ICD-10-CM | POA: Insufficient documentation

## 2020-03-16 DIAGNOSIS — Z5181 Encounter for therapeutic drug level monitoring: Secondary | ICD-10-CM | POA: Diagnosis not present

## 2020-03-16 DIAGNOSIS — R110 Nausea: Secondary | ICD-10-CM | POA: Insufficient documentation

## 2020-03-16 DIAGNOSIS — Z5321 Procedure and treatment not carried out due to patient leaving prior to being seen by health care provider: Secondary | ICD-10-CM | POA: Insufficient documentation

## 2020-03-16 LAB — URINALYSIS, ROUTINE W REFLEX MICROSCOPIC
Bilirubin Urine: NEGATIVE
Glucose, UA: NEGATIVE mg/dL
Ketones, ur: 5 mg/dL — AB
Leukocytes,Ua: NEGATIVE
Nitrite: NEGATIVE
Protein, ur: 30 mg/dL — AB
Specific Gravity, Urine: 1.023 (ref 1.005–1.030)
pH: 6 (ref 5.0–8.0)

## 2020-03-16 LAB — COMPREHENSIVE METABOLIC PANEL
ALT: 17 U/L (ref 0–44)
AST: 17 U/L (ref 15–41)
Albumin: 3.9 g/dL (ref 3.5–5.0)
Alkaline Phosphatase: 89 U/L (ref 38–126)
Anion gap: 9 (ref 5–15)
BUN: 14 mg/dL (ref 6–20)
CO2: 26 mmol/L (ref 22–32)
Calcium: 9.6 mg/dL (ref 8.9–10.3)
Chloride: 104 mmol/L (ref 98–111)
Creatinine, Ser: 0.95 mg/dL (ref 0.44–1.00)
GFR calc Af Amer: >60 mL/min (ref >60)
GFR calc non Af Amer: >60 mL/min (ref >60)
Glucose, Bld: 101 mg/dL — ABNORMAL HIGH (ref 70–99)
Potassium: 3.9 mmol/L (ref 3.5–5.1)
Sodium: 139 mmol/L (ref 135–145)
Total Bilirubin: 0.8 mg/dL (ref 0.3–1.2)
Total Protein: 7.5 g/dL (ref 6.5–8.1)

## 2020-03-16 LAB — I-STAT BETA HCG BLOOD, ED (MC, WL, AP ONLY): I-stat hCG, quantitative: <5 m[IU]/mL (ref <5)

## 2020-03-16 LAB — CBC
HCT: 44.7 % (ref 36.0–46.0)
Hemoglobin: 14.5 g/dL (ref 12.0–15.0)
MCH: 28.5 pg (ref 26.0–34.0)
MCHC: 32.4 g/dL (ref 30.0–36.0)
MCV: 88 fL (ref 80.0–100.0)
Platelets: 339 10*3/uL (ref 150–400)
RBC: 5.08 MIL/uL (ref 3.87–5.11)
RDW: 13.7 % (ref 11.5–15.5)
WBC: 11.6 10*3/uL — ABNORMAL HIGH (ref 4.0–10.5)
nRBC: 0 % (ref 0.0–0.2)

## 2020-03-16 LAB — LIPASE, BLOOD: Lipase: 19 U/L (ref 11–51)

## 2020-03-16 MED ORDER — ONDANSETRON 4 MG PO TBDP
8.0000 mg | ORAL_TABLET | Freq: Once | ORAL | Status: AC
  Administered 2020-03-16: 8 mg via ORAL
  Filled 2020-03-16: qty 2

## 2020-03-16 NOTE — ED Triage Notes (Signed)
Pt reports severe RLQ pain that started earlier today, some nausea, no vomiting. Pt tearful in triage.

## 2020-03-17 NOTE — ED Notes (Addendum)
Pt approached and stated she will see her doctor instead of waiting in the ED. Stickers and wristband removed.

## 2020-03-18 DIAGNOSIS — Z5181 Encounter for therapeutic drug level monitoring: Secondary | ICD-10-CM | POA: Diagnosis not present

## 2020-03-23 DIAGNOSIS — Z5181 Encounter for therapeutic drug level monitoring: Secondary | ICD-10-CM | POA: Diagnosis not present

## 2020-03-25 DIAGNOSIS — Z5181 Encounter for therapeutic drug level monitoring: Secondary | ICD-10-CM | POA: Diagnosis not present

## 2020-04-06 DIAGNOSIS — Z5181 Encounter for therapeutic drug level monitoring: Secondary | ICD-10-CM | POA: Diagnosis not present

## 2020-04-08 DIAGNOSIS — Z5181 Encounter for therapeutic drug level monitoring: Secondary | ICD-10-CM | POA: Diagnosis not present

## 2020-04-16 DIAGNOSIS — Z5181 Encounter for therapeutic drug level monitoring: Secondary | ICD-10-CM | POA: Diagnosis not present

## 2020-04-18 DIAGNOSIS — Z5181 Encounter for therapeutic drug level monitoring: Secondary | ICD-10-CM | POA: Diagnosis not present

## 2020-04-20 DIAGNOSIS — Z5181 Encounter for therapeutic drug level monitoring: Secondary | ICD-10-CM | POA: Diagnosis not present

## 2020-04-22 DIAGNOSIS — Z5181 Encounter for therapeutic drug level monitoring: Secondary | ICD-10-CM | POA: Diagnosis not present

## 2020-04-24 DIAGNOSIS — Z5181 Encounter for therapeutic drug level monitoring: Secondary | ICD-10-CM | POA: Diagnosis not present

## 2020-04-26 DIAGNOSIS — Z5181 Encounter for therapeutic drug level monitoring: Secondary | ICD-10-CM | POA: Diagnosis not present

## 2020-04-28 DIAGNOSIS — Z5181 Encounter for therapeutic drug level monitoring: Secondary | ICD-10-CM | POA: Diagnosis not present

## 2020-04-30 DIAGNOSIS — Z5181 Encounter for therapeutic drug level monitoring: Secondary | ICD-10-CM | POA: Diagnosis not present

## 2020-05-04 DIAGNOSIS — Z5181 Encounter for therapeutic drug level monitoring: Secondary | ICD-10-CM | POA: Diagnosis not present

## 2020-05-06 DIAGNOSIS — Z5181 Encounter for therapeutic drug level monitoring: Secondary | ICD-10-CM | POA: Diagnosis not present

## 2020-09-06 IMAGING — DX DG CHEST 1V PORT
1 series · 1 of 1 positions shown · non-contrast
Comparison: 05/28/2017

CLINICAL DATA: Wheezing.  Patient is in labor.

EXAM:
PORTABLE CHEST 1 VIEW

[chest]
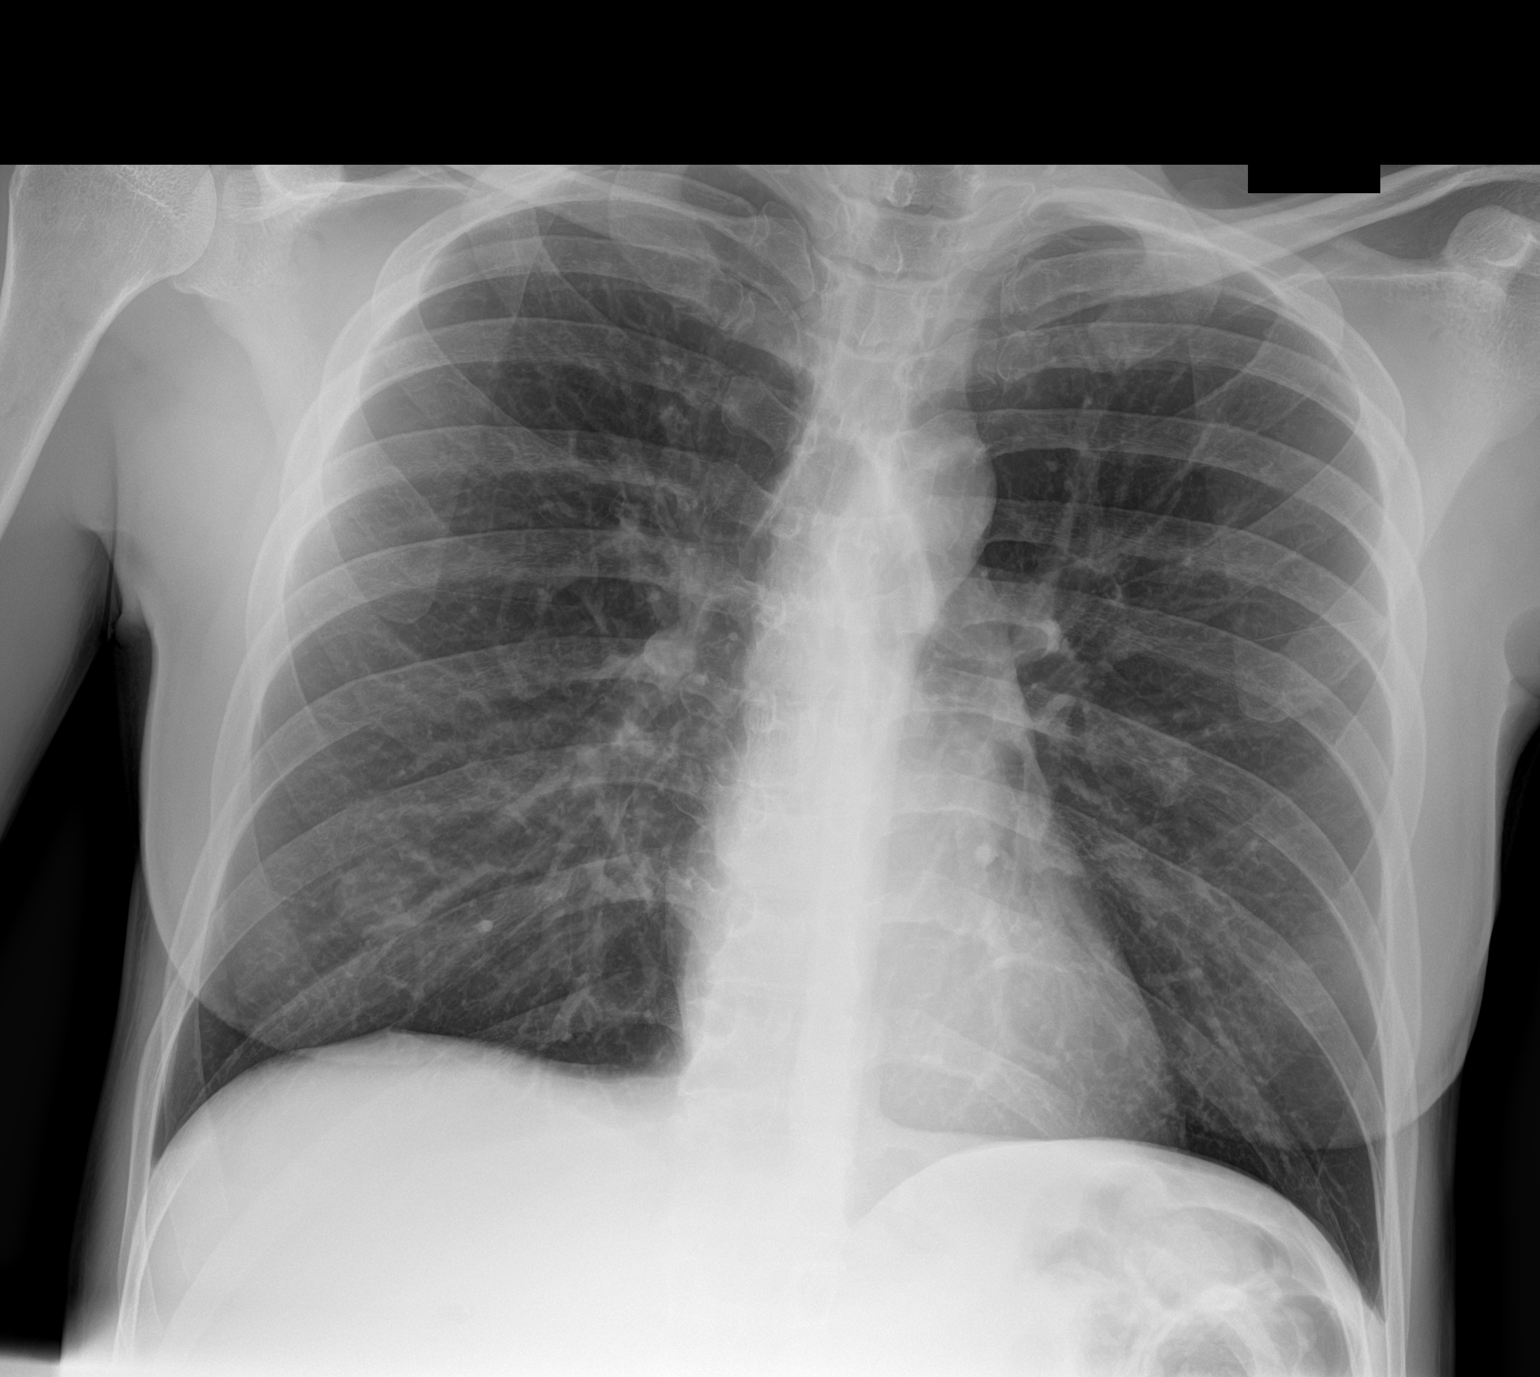

[1 of 1 positions shown; findings below may reference images not displayed]

FINDINGS: The heart size and mediastinal contours are within normal limits.
Both lungs are clear. The visualized skeletal structures are
unremarkable.
IMPRESSION: Normal exam.
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
## 2021-01-01 ENCOUNTER — Encounter (HOSPITAL_COMMUNITY): Payer: Self-pay

## 2021-01-01 ENCOUNTER — Emergency Department (HOSPITAL_COMMUNITY): Payer: Medicaid Other

## 2021-01-01 ENCOUNTER — Inpatient Hospital Stay (HOSPITAL_COMMUNITY)
Admission: EM | Admit: 2021-01-01 | Discharge: 2021-01-03 | DRG: 389 | Disposition: A | Discharge status: Home / Self Care | Payer: Medicaid Other | Attending: Internal Medicine | Admitting: Internal Medicine

## 2021-01-01 ENCOUNTER — Other Ambulatory Visit: Payer: Self-pay

## 2021-01-01 ENCOUNTER — Telehealth: Payer: Medicaid Other | Admitting: Family

## 2021-01-01 DIAGNOSIS — G43909 Migraine, unspecified, not intractable, without status migrainosus: Secondary | ICD-10-CM | POA: Diagnosis present

## 2021-01-01 DIAGNOSIS — F1510 Other stimulant abuse, uncomplicated: Secondary | ICD-10-CM | POA: Diagnosis not present

## 2021-01-01 DIAGNOSIS — Z7951 Long term (current) use of inhaled steroids: Secondary | ICD-10-CM

## 2021-01-01 DIAGNOSIS — K3189 Other diseases of stomach and duodenum: Secondary | ICD-10-CM | POA: Diagnosis not present

## 2021-01-01 DIAGNOSIS — K56609 Unspecified intestinal obstruction, unspecified as to partial versus complete obstruction: Secondary | ICD-10-CM | POA: Diagnosis not present

## 2021-01-01 DIAGNOSIS — Z8042 Family history of malignant neoplasm of prostate: Secondary | ICD-10-CM

## 2021-01-01 DIAGNOSIS — Z0189 Encounter for other specified special examinations: Secondary | ICD-10-CM

## 2021-01-01 DIAGNOSIS — F32A Depression, unspecified: Secondary | ICD-10-CM | POA: Diagnosis present

## 2021-01-01 DIAGNOSIS — F1590 Other stimulant use, unspecified, uncomplicated: Secondary | ICD-10-CM | POA: Diagnosis present

## 2021-01-01 DIAGNOSIS — E876 Hypokalemia: Secondary | ICD-10-CM | POA: Diagnosis not present

## 2021-01-01 DIAGNOSIS — Z79899 Other long term (current) drug therapy: Secondary | ICD-10-CM

## 2021-01-01 DIAGNOSIS — R1084 Generalized abdominal pain: Secondary | ICD-10-CM | POA: Diagnosis not present

## 2021-01-01 DIAGNOSIS — F064 Anxiety disorder due to known physiological condition: Secondary | ICD-10-CM | POA: Diagnosis present

## 2021-01-01 DIAGNOSIS — Z7982 Long term (current) use of aspirin: Secondary | ICD-10-CM

## 2021-01-01 DIAGNOSIS — J4520 Mild intermittent asthma, uncomplicated: Secondary | ICD-10-CM | POA: Diagnosis not present

## 2021-01-01 DIAGNOSIS — I10 Essential (primary) hypertension: Secondary | ICD-10-CM | POA: Diagnosis present

## 2021-01-01 DIAGNOSIS — Z888 Allergy status to other drugs, medicaments and biological substances status: Secondary | ICD-10-CM

## 2021-01-01 DIAGNOSIS — K8689 Other specified diseases of pancreas: Secondary | ICD-10-CM | POA: Diagnosis present

## 2021-01-01 DIAGNOSIS — F17210 Nicotine dependence, cigarettes, uncomplicated: Secondary | ICD-10-CM | POA: Diagnosis present

## 2021-01-01 DIAGNOSIS — Z886 Allergy status to analgesic agent status: Secondary | ICD-10-CM

## 2021-01-01 DIAGNOSIS — K9189 Other postprocedural complications and disorders of digestive system: Secondary | ICD-10-CM | POA: Diagnosis present

## 2021-01-01 DIAGNOSIS — R188 Other ascites: Secondary | ICD-10-CM | POA: Diagnosis present

## 2021-01-01 DIAGNOSIS — R109 Unspecified abdominal pain: Secondary | ICD-10-CM

## 2021-01-01 DIAGNOSIS — Z8759 Personal history of other complications of pregnancy, childbirth and the puerperium: Secondary | ICD-10-CM

## 2021-01-01 DIAGNOSIS — F1020 Alcohol dependence, uncomplicated: Secondary | ICD-10-CM | POA: Diagnosis not present

## 2021-01-01 DIAGNOSIS — Z20822 Contact with and (suspected) exposure to covid-19: Secondary | ICD-10-CM | POA: Diagnosis present

## 2021-01-01 DIAGNOSIS — F17200 Nicotine dependence, unspecified, uncomplicated: Secondary | ICD-10-CM | POA: Diagnosis present

## 2021-01-01 DIAGNOSIS — O34219 Maternal care for unspecified type scar from previous cesarean delivery: Secondary | ICD-10-CM

## 2021-01-01 DIAGNOSIS — Z98891 History of uterine scar from previous surgery: Secondary | ICD-10-CM

## 2021-01-01 DIAGNOSIS — Z90710 Acquired absence of both cervix and uterus: Secondary | ICD-10-CM

## 2021-01-01 DIAGNOSIS — R9431 Abnormal electrocardiogram [ECG] [EKG]: Secondary | ICD-10-CM | POA: Diagnosis not present

## 2021-01-01 DIAGNOSIS — N200 Calculus of kidney: Secondary | ICD-10-CM | POA: Diagnosis not present

## 2021-01-01 DIAGNOSIS — Z91419 Personal history of unspecified adult abuse: Secondary | ICD-10-CM

## 2021-01-01 DIAGNOSIS — Z800 Family history of malignant neoplasm of digestive organs: Secondary | ICD-10-CM

## 2021-01-01 DIAGNOSIS — Z91018 Allergy to other foods: Secondary | ICD-10-CM

## 2021-01-01 DIAGNOSIS — K5669 Other partial intestinal obstruction: Secondary | ICD-10-CM | POA: Diagnosis not present

## 2021-01-01 DIAGNOSIS — R1111 Vomiting without nausea: Secondary | ICD-10-CM | POA: Diagnosis not present

## 2021-01-01 LAB — URINALYSIS, ROUTINE W REFLEX MICROSCOPIC
Bilirubin Urine: NEGATIVE
Glucose, UA: NEGATIVE mg/dL
Ketones, ur: NEGATIVE mg/dL
Leukocytes,Ua: NEGATIVE
Nitrite: POSITIVE — AB
Protein, ur: NEGATIVE mg/dL
Specific Gravity, Urine: 1.02 (ref 1.005–1.030)
pH: 6 (ref 5.0–8.0)

## 2021-01-01 LAB — CBC WITH DIFFERENTIAL/PLATELET
Abs Immature Granulocytes: 0.03 10*3/uL (ref 0.00–0.07)
Basophils Absolute: 0.1 10*3/uL (ref 0.0–0.1)
Basophils Relative: 1 %
Eosinophils Absolute: 0.4 10*3/uL (ref 0.0–0.5)
Eosinophils Relative: 5 %
HCT: 44.1 % (ref 36.0–46.0)
Hemoglobin: 14.7 g/dL (ref 12.0–15.0)
Immature Granulocytes: 0 %
Lymphocytes Relative: 23 %
Lymphs Abs: 2.2 10*3/uL (ref 0.7–4.0)
MCH: 29.6 pg (ref 26.0–34.0)
MCHC: 33.3 g/dL (ref 30.0–36.0)
MCV: 88.9 fL (ref 80.0–100.0)
Monocytes Absolute: 0.7 10*3/uL (ref 0.1–1.0)
Monocytes Relative: 8 %
Neutro Abs: 6 10*3/uL (ref 1.7–7.7)
Neutrophils Relative %: 63 %
Platelets: 289 10*3/uL (ref 150–400)
RBC: 4.96 MIL/uL (ref 3.87–5.11)
RDW: 13.5 % (ref 11.5–15.5)
WBC: 9.4 10*3/uL (ref 4.0–10.5)
nRBC: 0 % (ref 0.0–0.2)

## 2021-01-01 LAB — RESP PANEL BY RT-PCR (FLU A&B, COVID) ARPGX2
Influenza A by PCR: NEGATIVE
Influenza B by PCR: NEGATIVE
SARS Coronavirus 2 by RT PCR: NEGATIVE

## 2021-01-01 LAB — COMPREHENSIVE METABOLIC PANEL
ALT: 56 U/L — ABNORMAL HIGH (ref 0–44)
AST: 25 U/L (ref 15–41)
Albumin: 3.9 g/dL (ref 3.5–5.0)
Alkaline Phosphatase: 104 U/L (ref 38–126)
Anion gap: 7 (ref 5–15)
BUN: 17 mg/dL (ref 6–20)
CO2: 28 mmol/L (ref 22–32)
Calcium: 9.5 mg/dL (ref 8.9–10.3)
Chloride: 105 mmol/L (ref 98–111)
Creatinine, Ser: 0.7 mg/dL (ref 0.44–1.00)
GFR, Estimated: >60 mL/min (ref >60)
Glucose, Bld: 120 mg/dL — ABNORMAL HIGH (ref 70–99)
Potassium: 3.8 mmol/L (ref 3.5–5.1)
Sodium: 140 mmol/L (ref 135–145)
Total Bilirubin: 0.8 mg/dL (ref 0.3–1.2)
Total Protein: 7.9 g/dL (ref 6.5–8.1)

## 2021-01-01 LAB — RAPID URINE DRUG SCREEN, HOSP PERFORMED
Amphetamines: POSITIVE — AB
Barbiturates: NOT DETECTED
Benzodiazepines: NOT DETECTED
Cocaine: NOT DETECTED
Opiates: POSITIVE — AB
Tetrahydrocannabinol: NOT DETECTED

## 2021-01-01 LAB — HIV ANTIBODY (ROUTINE TESTING W REFLEX): HIV Screen 4th Generation wRfx: NONREACTIVE

## 2021-01-01 LAB — LIPASE, BLOOD: Lipase: 20 U/L (ref 11–51)

## 2021-01-01 MED ORDER — HYDROMORPHONE HCL 1 MG/ML IJ SOLN
1.0000 mg | Freq: Once | INTRAMUSCULAR | Status: AC
  Administered 2021-01-01: 1 mg via INTRAVENOUS
  Filled 2021-01-01: qty 1

## 2021-01-01 MED ORDER — ONDANSETRON HCL 4 MG PO TABS: 4.0000 mg | ORAL_TABLET | Freq: Four times a day (QID) | ORAL | Status: DC | PRN

## 2021-01-01 MED ORDER — ONDANSETRON HCL 4 MG/2ML IJ SOLN: 4.0000 mg | Freq: Four times a day (QID) | INTRAMUSCULAR | Status: DC | PRN

## 2021-01-01 MED ORDER — ONDANSETRON HCL 4 MG/2ML IJ SOLN
4.0000 mg | Freq: Once | INTRAMUSCULAR | Status: AC
  Administered 2021-01-01: 4 mg via INTRAVENOUS
  Filled 2021-01-01: qty 2

## 2021-01-01 MED ORDER — ALBUTEROL SULFATE HFA 108 (90 BASE) MCG/ACT IN AERS: 1.0000 | INHALATION_SPRAY | RESPIRATORY_TRACT | Status: DC | PRN

## 2021-01-01 MED ORDER — ACETAMINOPHEN 650 MG RE SUPP: 650.0000 mg | Freq: Four times a day (QID) | RECTAL | Status: DC | PRN

## 2021-01-01 MED ORDER — POLYETHYLENE GLYCOL 3350 17 G PO PACK: 17.0000 g | PACK | Freq: Every day | ORAL | Status: DC | PRN

## 2021-01-01 MED ORDER — FLUTICASONE FUROATE-VILANTEROL 200-25 MCG/INH IN AEPB
1.0000 | INHALATION_SPRAY | Freq: Every day | RESPIRATORY_TRACT | Status: DC
  Administered 2021-01-02 – 2021-01-03 (×2): 1 via RESPIRATORY_TRACT
  Filled 2021-01-01: qty 28

## 2021-01-01 MED ORDER — KETOROLAC TROMETHAMINE 15 MG/ML IJ SOLN
15.0000 mg | Freq: Four times a day (QID) | INTRAMUSCULAR | Status: DC | PRN
  Administered 2021-01-01 – 2021-01-03 (×5): 15 mg via INTRAVENOUS
  Filled 2021-01-01 (×5): qty 1

## 2021-01-01 MED ORDER — SODIUM CHLORIDE 0.9% FLUSH
3.0000 mL | Freq: Two times a day (BID) | INTRAVENOUS | Status: DC
  Administered 2021-01-02: 3 mL via INTRAVENOUS

## 2021-01-01 MED ORDER — LACTATED RINGERS IV SOLN: INTRAVENOUS | Status: AC

## 2021-01-01 MED ORDER — METOPROLOL TARTRATE 5 MG/5ML IV SOLN: 5.0000 mg | Freq: Four times a day (QID) | INTRAVENOUS | Status: DC | PRN

## 2021-01-01 MED ORDER — ACETAMINOPHEN 325 MG PO TABS
650.0000 mg | ORAL_TABLET | Freq: Four times a day (QID) | ORAL | Status: DC | PRN
  Administered 2021-01-02: 650 mg via ORAL
  Filled 2021-01-01: qty 2

## 2021-01-01 MED ORDER — DIATRIZOATE MEGLUMINE & SODIUM 66-10 % PO SOLN
90.0000 mL | Freq: Once | ORAL | Status: DC
  Filled 2021-01-01: qty 90

## 2021-01-01 NOTE — ED Notes (Signed)
Attempted to insert NG tube, patient pulled at nurses hands and pulled tube out, re-explained to patient that she is going have to relax and try to resist pulling it out, patient states she cant. Asked if I called her Boyfriend to hold her hand if she would let me try again she said yes. Boyfriend called and is on way
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
## 2021-01-01 NOTE — Progress Notes (Signed)
RN asked pt if she wanted to attempt the NG tube placement and pt denied at this time and stated she could not handle being put through that again. MD notified.
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
## 2021-01-01 NOTE — ED Notes (Signed)
Patient ambulatory to restroom  ?

## 2021-01-01 NOTE — ED Notes (Signed)
asked patient if she could attempt to urinate, she said she don't have to at this time but would like something for pain.  PA made aware

## 2021-01-01 NOTE — ED Triage Notes (Signed)
Lower abd pain with emsis x4 this am. Denies fever/diarrhea

## 2021-01-01 NOTE — Progress Notes (Signed)
   Virtual Visit  Note Due to COVID-19 pandemic this visit was conducted virtually. This visit type was conducted due to national recommendations for restrictions regarding the COVID-19 Pandemic (e.g. social distancing, sheltering in place) in an effort to limit this patient's exposure and mitigate transmission in our community. All issues noted in this document were discussed and addressed.  A physical exam was not performed with this format.  I connected with Rowyn L Mcbreen on 01/01/21 at 8:31 AM  by video and verified that I am speaking with the correct person using two identifiers. Andreas Ohm Deschler is currently located at home and husband is currently with her during visit. The provider, Jannifer Rodney, FNP is located in their office at time of visit.  I discussed the limitations, risks, security and privacy concerns of performing an evaluation and management service by video and the availability of in person appointments. I also discussed with the patient that there may be a patient responsible charge related to this service. The patient expressed understanding and agreed to proceed.   History and Present Illness:  Husband and patient present with extreme abdominal pain that started this morning. Husband reports she has had 20 children and the last child birth had complications.  Abdominal Pain This is a new problem. The current episode started today. The onset quality is gradual. The problem occurs constantly. The problem has been unchanged. The pain is located in the LLQ. The pain is at a severity of 10/10. The pain is moderate. The quality of the pain is cramping and aching. Associated symptoms include nausea and vomiting. Pertinent negatives include no constipation or diarrhea. Nothing aggravates the pain.     Review of Systems  Gastrointestinal: Positive for abdominal pain, nausea and vomiting. Negative for constipation and diarrhea.     Observations/Objective: Pt is moaning and  groaning and unable to talk because of pain  Assessment and Plan: 1. Abdominal pain, unspecified abdominal location Pt sent to ED. They do not have transportation so advised to call EMS. Do not eat or drink anything      I discussed the assessment and treatment plan with the patient. The patient was provided an opportunity to ask questions and all were answered. The patient agreed with the plan and demonstrated an understanding of the instructions.   The patient was advised to call back or seek an in-person evaluation if the symptoms worsen or if the condition fails to improve as anticipated.  The above assessment and management plan was discussed with the patient. The patient verbalized understanding of and has agreed to the management plan. Patient is aware to call the clinic if symptoms persist or worsen. Patient is aware when to return to the clinic for a follow-up visit. Patient educated on when it is appropriate to go to the emergency department.   Time call ended:  8:40 AM   I provided 9 minutes of   face-to-face time during this encounter.    Jannifer Rodney, FNP
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
## 2021-01-01 NOTE — ED Notes (Signed)
Attempted to perform NG tube again. Patient refused stating she is waiting on boyfriend raymond. Called boyfriend and he said he is on the way that he had to wait on a ride to pick him up

## 2021-01-02 ENCOUNTER — Observation Stay (HOSPITAL_COMMUNITY): Payer: Medicaid Other

## 2021-01-02 DIAGNOSIS — F1020 Alcohol dependence, uncomplicated: Secondary | ICD-10-CM | POA: Diagnosis present

## 2021-01-02 DIAGNOSIS — F17210 Nicotine dependence, cigarettes, uncomplicated: Secondary | ICD-10-CM | POA: Diagnosis present

## 2021-01-02 DIAGNOSIS — R9431 Abnormal electrocardiogram [ECG] [EKG]: Secondary | ICD-10-CM | POA: Diagnosis not present

## 2021-01-02 DIAGNOSIS — F32A Depression, unspecified: Secondary | ICD-10-CM | POA: Diagnosis present

## 2021-01-02 DIAGNOSIS — J4520 Mild intermittent asthma, uncomplicated: Secondary | ICD-10-CM | POA: Diagnosis present

## 2021-01-02 DIAGNOSIS — Z91018 Allergy to other foods: Secondary | ICD-10-CM | POA: Diagnosis not present

## 2021-01-02 DIAGNOSIS — K9189 Other postprocedural complications and disorders of digestive system: Secondary | ICD-10-CM | POA: Diagnosis present

## 2021-01-02 DIAGNOSIS — Z800 Family history of malignant neoplasm of digestive organs: Secondary | ICD-10-CM | POA: Diagnosis not present

## 2021-01-02 DIAGNOSIS — Z79899 Other long term (current) drug therapy: Secondary | ICD-10-CM | POA: Diagnosis not present

## 2021-01-02 DIAGNOSIS — N200 Calculus of kidney: Secondary | ICD-10-CM | POA: Diagnosis not present

## 2021-01-02 DIAGNOSIS — K56609 Unspecified intestinal obstruction, unspecified as to partial versus complete obstruction: Secondary | ICD-10-CM | POA: Diagnosis present

## 2021-01-02 DIAGNOSIS — K3189 Other diseases of stomach and duodenum: Secondary | ICD-10-CM | POA: Diagnosis not present

## 2021-01-02 DIAGNOSIS — Z20822 Contact with and (suspected) exposure to covid-19: Secondary | ICD-10-CM | POA: Diagnosis present

## 2021-01-02 DIAGNOSIS — Z7951 Long term (current) use of inhaled steroids: Secondary | ICD-10-CM | POA: Diagnosis not present

## 2021-01-02 DIAGNOSIS — K8689 Other specified diseases of pancreas: Secondary | ICD-10-CM | POA: Diagnosis not present

## 2021-01-02 DIAGNOSIS — I10 Essential (primary) hypertension: Secondary | ICD-10-CM | POA: Diagnosis present

## 2021-01-02 DIAGNOSIS — Z888 Allergy status to other drugs, medicaments and biological substances status: Secondary | ICD-10-CM | POA: Diagnosis not present

## 2021-01-02 DIAGNOSIS — Z8759 Personal history of other complications of pregnancy, childbirth and the puerperium: Secondary | ICD-10-CM | POA: Diagnosis not present

## 2021-01-02 DIAGNOSIS — F1590 Other stimulant use, unspecified, uncomplicated: Secondary | ICD-10-CM | POA: Diagnosis present

## 2021-01-02 DIAGNOSIS — R188 Other ascites: Secondary | ICD-10-CM | POA: Diagnosis present

## 2021-01-02 DIAGNOSIS — K5669 Other partial intestinal obstruction: Secondary | ICD-10-CM | POA: Diagnosis not present

## 2021-01-02 DIAGNOSIS — Z886 Allergy status to analgesic agent status: Secondary | ICD-10-CM | POA: Diagnosis not present

## 2021-01-02 DIAGNOSIS — Z8042 Family history of malignant neoplasm of prostate: Secondary | ICD-10-CM | POA: Diagnosis not present

## 2021-01-02 DIAGNOSIS — R109 Unspecified abdominal pain: Secondary | ICD-10-CM | POA: Diagnosis not present

## 2021-01-02 DIAGNOSIS — Z98891 History of uterine scar from previous surgery: Secondary | ICD-10-CM | POA: Diagnosis not present

## 2021-01-02 DIAGNOSIS — Z91419 Personal history of unspecified adult abuse: Secondary | ICD-10-CM | POA: Diagnosis not present

## 2021-01-02 DIAGNOSIS — Z90710 Acquired absence of both cervix and uterus: Secondary | ICD-10-CM | POA: Diagnosis not present

## 2021-01-02 DIAGNOSIS — Z7982 Long term (current) use of aspirin: Secondary | ICD-10-CM | POA: Diagnosis not present

## 2021-01-02 DIAGNOSIS — F064 Anxiety disorder due to known physiological condition: Secondary | ICD-10-CM | POA: Diagnosis present

## 2021-01-02 LAB — BASIC METABOLIC PANEL
Anion gap: 9 (ref 5–15)
BUN: 17 mg/dL (ref 6–20)
CO2: 27 mmol/L (ref 22–32)
Calcium: 9.2 mg/dL (ref 8.9–10.3)
Chloride: 104 mmol/L (ref 98–111)
Creatinine, Ser: 0.8 mg/dL (ref 0.44–1.00)
GFR, Estimated: >60 mL/min (ref >60)
Glucose, Bld: 105 mg/dL — ABNORMAL HIGH (ref 70–99)
Potassium: 3.3 mmol/L — ABNORMAL LOW (ref 3.5–5.1)
Sodium: 140 mmol/L (ref 135–145)

## 2021-01-02 LAB — CBC
HCT: 48.1 % — ABNORMAL HIGH (ref 36.0–46.0)
Hemoglobin: 15.5 g/dL — ABNORMAL HIGH (ref 12.0–15.0)
MCH: 29.6 pg (ref 26.0–34.0)
MCHC: 32.2 g/dL (ref 30.0–36.0)
MCV: 91.8 fL (ref 80.0–100.0)
Platelets: 265 10*3/uL (ref 150–400)
RBC: 5.24 MIL/uL — ABNORMAL HIGH (ref 3.87–5.11)
RDW: 13.5 % (ref 11.5–15.5)
WBC: 8.5 10*3/uL (ref 4.0–10.5)
nRBC: 0 % (ref 0.0–0.2)

## 2021-01-02 MED ORDER — KETOROLAC TROMETHAMINE 15 MG/ML IJ SOLN
15.0000 mg | Freq: Once | INTRAMUSCULAR | Status: AC
  Administered 2021-01-02: 15 mg via INTRAVENOUS
  Filled 2021-01-02: qty 1

## 2021-01-02 MED ORDER — POTASSIUM CHLORIDE 10 MEQ/100ML IV SOLN
10.0000 meq | INTRAVENOUS | Status: AC
  Administered 2021-01-02 (×4): 10 meq via INTRAVENOUS
  Filled 2021-01-02 (×4): qty 100

## 2021-01-02 MED ORDER — SODIUM CHLORIDE 0.9 % IV SOLN: INTRAVENOUS | Status: DC

## 2021-01-02 MED ORDER — LABETALOL HCL 5 MG/ML IV SOLN: 10.0000 mg | INTRAVENOUS | Status: DC | PRN

## 2021-01-02 NOTE — Plan of Care (Signed)
  Problem: Nutrition: Goal: Adequate nutrition will be maintained Outcome: Progressing   Problem: Elimination: Goal: Will not experience complications related to bowel motility Outcome: Progressing   Problem: Pain Managment: Goal: General experience of comfort will improve Outcome: Progressing   

## 2021-01-02 NOTE — Progress Notes (Signed)
Subjective/Chief Complaint: Complains of feeling terrible. No vomiting   Objective: Vital signs in last 24 hours: Temp:  [98.2 F (36.8 C)-98.5 F (36.9 C)] 98.3 F (36.8 C) (06/04 0456) Pulse Rate:  [57-88] 80 (06/04 0456) Resp:  [16-20] 20 (06/04 0048) BP: (128-161)/(87-118) 161/106 (06/04 0456) SpO2:  [93 %-99 %] 95 % (06/04 0456) Weight:  [65.8 kg] 65.8 kg (06/03 0926) Last BM Date: 01/01/21  Intake/Output from previous day: 06/03 0701 - 06/04 0700 In: 1065 [I.V.:1065] Out: -  Intake/Output this shift: No intake/output data recorded.  General appearance: alert and cooperative Resp: clear to auscultation bilaterally Cardio: regular rate and rhythm GI: soft, mild diffuse tenderness. good bs. no distension  Lab Results:  Recent Labs    01/01/21 1025 01/02/21 0607  WBC 9.4 8.5  HGB 14.7 15.5*  HCT 44.1 48.1*  PLT 289 265   BMET Recent Labs    01/01/21 1025  NA 140  K 3.8  CL 105  CO2 28  GLUCOSE 120*  BUN 17  CREATININE 0.70  CALCIUM 9.5   PT/INR No results for input(s): LABPROT, INR in the last 72 hours. ABG No results for input(s): PHART, HCO3 in the last 72 hours.  Invalid input(s): PCO2, PO2  Studies/Results: CT ABDOMEN PELVIS WO CONTRAST  Result Date: 01/01/2021 CLINICAL DATA:  Abdominal pain with nausea and vomiting EXAM: CT ABDOMEN AND PELVIS WITHOUT CONTRAST TECHNIQUE: Multidetector CT imaging of the abdomen and pelvis was performed following the standard protocol without IV contrast. COMPARISON:  None. FINDINGS: Lower chest: Bullous disease is noted in the right middle lobe medially. No edema or airspace opacity in the lung base regions. Hepatobiliary: No focal liver lesions are appreciable on this noncontrast enhanced study. Gallbladder wall is not appreciably thickened. There is no biliary duct dilatation. Pancreas: No pancreatic mass or inflammatory focus. Note that there is prominence of the pancreatic duct in the head and body region  measuring up to 4 mm. No calculus or obstructing lesion seen on this noncontrast enhanced study in the pancreas. No peripancreatic fluid. Spleen: No splenic lesions are evident. Adrenals/Urinary Tract: Adrenals bilaterally appear normal. No evident renal mass or hydronephrosis on either side. There is a 1 mm calculus in the upper pole right kidney region. There is a 2 mm calculus in the mid to lower right kidney. No appreciable calculus in the left kidney. No ureteral calculus evident on either side. Urinary bladder is midline with wall thickness within normal limits. Stomach/Bowel: There is dilatation of the stomach. There is dilatation of the small bowel to the mid ileal level where there is transition zone consistent with a degree of small-bowel obstruction. Transition zone is best seen on axial slice 57 series 2 and coronal slice 39 series. No free air or portal venous air evident. Appendix appears normal. Terminal ileum appears normal. Note moderate stool throughout the colon. Colon is not dilated, and there is no colonic wall thickening. No appreciable diverticular disease. Vascular/Lymphatic: No evident abdominal aortic aneurysm. No vascular lesions evident on noncontrast enhanced study. No evident adenopathy in the abdomen pelvis. Reproductive: Uterus absent.  No adnexal masses evident. Other: There is moderate fluid in the posterior the portion of the pelvis. No other ascites. No evident abscess in the abdomen or pelvis. Musculoskeletal: No blastic or lytic bone lesions. Evidence of old trauma in the pubic symphysis region with remodeling. No intramuscular or abdominal wall lesions. IMPRESSION: 1. Evidence of small bowel obstruction with transition zone in the mid ileal region.  Distal small bowel appears normal. No colonic dilatation. No appreciable diverticular disease. 2.  Ascites in the dependent portion of the pelvis. 3. Dilatation of the pancreatic duct in the head and proximal body region up to 4 mm.  Pancreas otherwise appears normal. Nonemergent MR of the pancreas pre and post-contrast to further evaluate may be warranted given this localized pancreatic duct dilatation. No obstructing focus seen by noncontrast CT. 4. Small nonobstructing calculi in right kidney. No hydronephrosis or ureteral calculus on either side. Urinary bladder wall thickness normal. 5.  Absent uterus. 6. Appendix appears normal. No abscess evident in the abdomen or pelvis. Electronically Signed   By: Bretta Bang III M.D.   On: 01/01/2021 13:48    Anti-infectives: Anti-infectives (From admission, onward)   None      Assessment/Plan: s/p * No surgery found * Continue bowel rest. Nurses were not able to place ng overnight Will check abd xray today Small bowel obstruction - CT 6/3 with evidence of small bowel obstruction with transition zone in the mid ileal region - history of SB injury during cesarean hysterectomy 2020 - small bowel obstruction protocol  FEN: NPO, NGT ID: none VTE: okay for chemical VTE from our standpoint  Substance abuse - urine drug screen pending Asthma  LOS: 0 days    Tamara Bradford III 01/02/2021

## 2021-01-02 NOTE — Progress Notes (Signed)
PROGRESS NOTE    Tamara Bradford  JHE:[id number] DOB: [birth year] DOA: 01/01/2021 PCP: Patient, No Pcp Per (Inactive)   Chief Complain: Abdominal pain, nausea, vomiting  Brief Narrative: Patient is a 45 year old female with history of 5 prior cesarean section with bowel injury, substance abuse, alcohol abuse, asthma, postoperative ileus managed with multiple NG tube, tobacco abuse who presented to the emergency department with complaints of left-sided abdominal pain, nausea and vomiting.  On presentation she was afebrile, hemodynamically stable.  CT abdomen and pelvis showed SBO, ascites in the dependent portion of the pelvis, dilation of pancreatic duct in the head and proximal body up to 4 mm.  Patient was started on IV fluids, pain medications.  General surgery consulted.  Patient did not tolerate NG tube placement.  Assessment & Plan:   Principal Problem:   SBO (small bowel obstruction) (HCC) Active Problems:   Mild intermittent asthma   Tobacco use disorder   History of 5 Prior Cesearean Sections - last 04/22/2018   Methamphetamine use (HCC)   Alcohol use disorder, severe, dependence (HCC)   SBO: History of multiple abdominal surgeries, small bowel injury during hysterectomy 2020.  Presented with Abdomen Pain, nausea, vomiting.  General surgery following.  She did not tolerate NG tube placement on presentation.  Started on conservative management. Abdominal x-ray done this morning does not show evidence of bowel obstruction.  She denies any worsening abdominal pain, nausea or vomiting and wants to eat food.  She has not passed any gas, does not have a bowel movement yet.  Started on clear liquids.  Continue IV fluids  Polysubstance abuse/alcohol abuse: Counseled for cessation.  UDS positive for opiates, amphetamines  Anxiety: Continue supportive care.  Declining anxiolytics  Tobacco use: Smokes half packs a day.  Continue nicotine patch.  Counseled for cessation  Mild intermittent  asthma: She had mild wheezing on presentation.  Currently on room air.  Continue bronchodilators  Hypokalemia: Supplemented with potassium.  Hypertension: Blood pressure consistently in the upper side.  Not on any antihypertensives at home.  Continue pain medications for now.  Might need blood pressure medication on discharge if remains hypertensive.  Migraine headache: Ordered migraine cocktail          DVT prophylaxis: SCD Code Status: Full Family Communication: None Status is: Observation  The patient remains OBS appropriate and will d/c before 2 midnights.  Dispo: The patient is from: Home              Anticipated d/c is to: Home              Patient currently is not medically stable to d/c.   Difficult to place patient No    Consultants: None  Procedures:None  Antimicrobials:  Anti-infectives (From admission, onward)   None      Subjective:  Patient seen and examined at the bedside this morning.  Feels better.  Denies any abdominal pain, nausea or vomiting.  No bowel movement or not passing gas.  Abdomen does not look significantly distended and has bowel sounds.  Able to eat food.   Objective: Vitals:   01/01/21 2029 01/02/21 0048 01/02/21 0456 01/02/21 0759  BP: (!) 158/102 (!) 153/103 (!) 161/106   Pulse: 85 79 80   Resp: 16 20    Temp: 98.2 F (36.8 C) 98.5 F (36.9 C) 98.3 F (36.8 C)   TempSrc: Oral Oral Oral   SpO2: 94% 93% 95% 95%  Weight:      Height:  Intake/Output Summary (Last 24 hours) at 01/02/2021 0849 Last data filed at 01/02/2021 0300 Gross per 24 hour  Intake 1065 ml  Output --  Net 1065 ml   Filed Weights   01/01/21 0926  Weight: 65.8 kg    Examination:  General exam: Overall comfortable, not in distress HEENT: PERRL Respiratory system:  no wheezes or crackles  Cardiovascular system: S1 & S2 heard, RRR.  Gastrointestinal system: Abdomen is mildly distended, soft and nontender.BS present, old surgical  incision Central nervous system: Alert and oriented Extremities: No edema, no clubbing ,no cyanosis Skin: No rashes, no ulcers,no icterus     Data Reviewed: I have personally reviewed following labs and imaging studies  CBC: Recent Labs  Lab 01/01/21 1025 01/02/21 0607  WBC 9.4 8.5  NEUTROABS 6.0  --   HGB 14.7 15.5*  HCT 44.1 48.1*  MCV 88.9 91.8  PLT 289 265   Basic Metabolic Panel: Recent Labs  Lab 01/01/21 1025 01/02/21 0607  NA 140 140  K 3.8 3.3*  CL 105 104  CO2 28 27  GLUCOSE 120* 105*  BUN 17 17  CREATININE 0.70 0.80  CALCIUM 9.5 9.2   GFR: Estimated Creatinine Clearance: 87.3 mL/min (by C-G formula based on SCr of 0.8 mg/dL). Liver Function Tests: Recent Labs  Lab 01/01/21 1025  AST 25  ALT 56*  ALKPHOS 104  BILITOT 0.8  PROT 7.9  ALBUMIN 3.9   Recent Labs  Lab 01/01/21 1025  LIPASE 20   No results for input(s): AMMONIA in the last 168 hours. Coagulation Profile: No results for input(s): INR, PROTIME in the last 168 hours. Cardiac Enzymes: No results for input(s): CKTOTAL, CKMB, CKMBINDEX, TROPONINI in the last 168 hours. BNP (last 3 results) No results for input(s): PROBNP in the last 8760 hours. HbA1C: No results for input(s): HGBA1C in the last 72 hours. CBG: No results for input(s): GLUCAP in the last 168 hours. Lipid Profile: No results for input(s): CHOL, HDL, LDLCALC, TRIG, CHOLHDL, LDLDIRECT in the last 72 hours. Thyroid Function Tests: No results for input(s): TSH, T4TOTAL, FREET4, T3FREE, THYROIDAB in the last 72 hours. Anemia Panel: No results for input(s): VITAMINB12, FOLATE, FERRITIN, TIBC, IRON, RETICCTPCT in the last 72 hours. Sepsis Labs: No results for input(s): PROCALCITON, LATICACIDVEN in the last 168 hours.  Recent Results (from the past 240 hour(s))  Resp Panel by RT-PCR (Flu A&B, Covid) Nasopharyngeal Swab     Status: None   Collection Time: 01/01/21  2:44 PM   Specimen: Nasopharyngeal Swab; Nasopharyngeal(NP)  swabs in vial transport medium  Result Value Ref Range Status   SARS Coronavirus 2 by RT PCR NEGATIVE NEGATIVE Final    Comment: (NOTE) SARS-CoV-2 target nucleic acids are NOT DETECTED.  The SARS-CoV-2 RNA is generally detectable in upper respiratory specimens during the acute phase of infection. The lowest concentration of SARS-CoV-2 viral copies this assay can detect is 138 copies/mL. A negative result does not preclude SARS-Cov-2 infection and should not be used as the sole basis for treatment or other patient management decisions. A negative result may occur with  improper specimen collection/handling, submission of specimen other than nasopharyngeal swab, presence of viral mutation(s) within the areas targeted by this assay, and inadequate number of viral copies(<138 copies/mL). A negative result must be combined with clinical observations, patient history, and epidemiological information. The expected result is Negative.  Fact Sheet for Patients:  BloggerCourse.com  Fact Sheet for Healthcare Providers:  SeriousBroker.it  This test is no t yet approved  or cleared by the [place] and  has been authorized for detection and/or diagnosis of SARS-CoV-2 by FDA under an Emergency Use Authorization (EUA). This EUA will remain  in effect (meaning this test can be used) for the duration of the COVID-19 declaration under Section 564(b)(1) of the Act, 21 U.S.C.section 360bbb-3(b)(1), unless the authorization is terminated  or revoked sooner.       Influenza A by PCR NEGATIVE NEGATIVE Final   Influenza B by PCR NEGATIVE NEGATIVE Final    Comment: (NOTE) The Xpert Xpress SARS-CoV-2/FLU/RSV plus assay is intended as an aid in the diagnosis of influenza from Nasopharyngeal swab specimens and should not be used as a sole basis for treatment. Nasal washings and aspirates are unacceptable for Xpert Xpress  SARS-CoV-2/FLU/RSV testing.  Fact Sheet for Patients: BloggerCourse.com  Fact Sheet for Healthcare Providers: SeriousBroker.it  This test is not yet approved or cleared by the [place] FDA and has been authorized for detection and/or diagnosis of SARS-CoV-2 by FDA under an Emergency Use Authorization (EUA). This EUA will remain in effect (meaning this test can be used) for the duration of the COVID-19 declaration under Section 564(b)(1) of the Act, 21 U.S.C. section 360bbb-3(b)(1), unless the authorization is terminated or revoked.  Performed at [facility], [address], [place], [place] [address]          Radiology Studies: CT ABDOMEN PELVIS WO CONTRAST  Result Date: 01/01/2021 CLINICAL DATA:  Abdominal pain with nausea and vomiting EXAM: CT ABDOMEN AND PELVIS WITHOUT CONTRAST TECHNIQUE: Multidetector CT imaging of the abdomen and pelvis was performed following the standard protocol without IV contrast. COMPARISON:  None. FINDINGS: Lower chest: Bullous disease is noted in the right middle lobe medially. No edema or airspace opacity in the lung base regions. Hepatobiliary: No focal liver lesions are appreciable on this noncontrast enhanced study. Gallbladder wall is not appreciably thickened. There is no biliary duct dilatation. Pancreas: No pancreatic mass or inflammatory focus. Note that there is prominence of the pancreatic duct in the head and body region measuring up to 4 mm. No calculus or obstructing lesion seen on this noncontrast enhanced study in the pancreas. No peripancreatic fluid. Spleen: No splenic lesions are evident. Adrenals/Urinary Tract: Adrenals bilaterally appear normal. No evident renal mass or hydronephrosis on either side. There is a 1 mm calculus in the upper pole right kidney region. There is a 2 mm calculus in the mid to lower right kidney. No appreciable calculus in the left kidney.  No ureteral calculus evident on either side. Urinary bladder is midline with wall thickness within normal limits. Stomach/Bowel: There is dilatation of the stomach. There is dilatation of the small bowel to the mid ileal level where there is transition zone consistent with a degree of small-bowel obstruction. Transition zone is best seen on axial slice 57 series 2 and coronal slice 39 series. No free air or portal venous air evident. Appendix appears normal. Terminal ileum appears normal. Note moderate stool throughout the colon. Colon is not dilated, and there is no colonic wall thickening. No appreciable diverticular disease. Vascular/Lymphatic: No evident abdominal aortic aneurysm. No vascular lesions evident on noncontrast enhanced study. No evident adenopathy in the abdomen pelvis. Reproductive: Uterus absent.  No adnexal masses evident. Other: There is moderate fluid in the posterior the portion of the pelvis. No other ascites. No evident abscess in the abdomen or pelvis. Musculoskeletal: No blastic or lytic bone lesions. Evidence of old trauma in the pubic symphysis  region with remodeling. No intramuscular or abdominal wall lesions. IMPRESSION: 1. Evidence of small bowel obstruction with transition zone in the mid ileal region. Distal small bowel appears normal. No colonic dilatation. No appreciable diverticular disease. 2.  Ascites in the dependent portion of the pelvis. 3. Dilatation of the pancreatic duct in the head and proximal body region up to 4 mm. Pancreas otherwise appears normal. Nonemergent MR of the pancreas pre and post-contrast to further evaluate may be warranted given this localized pancreatic duct dilatation. No obstructing focus seen by noncontrast CT. 4. Small nonobstructing calculi in right kidney. No hydronephrosis or ureteral calculus on either side. Urinary bladder wall thickness normal. 5.  Absent uterus. 6. Appendix appears normal. No abscess evident in the abdomen or pelvis.  Electronically Signed   By: Bretta BangWilliam  Woodruff III M.D.   On: 01/01/2021 13:48        Scheduled Meds: . diatrizoate meglumine-sodium  90 mL Per NG tube Once  . fluticasone furoate-vilanterol  1 puff Inhalation Daily  . sodium chloride flush  3 mL Intravenous Q12H   Continuous Infusions: . sodium chloride 100 mL/hr at 01/02/21 0749  . potassium chloride       LOS: 0 days    Time spent: 35 mins.More than 50% of that time was spent in counseling and/or coordination of care.      Burnadette PopAmrit Wednesday Ericsson, MD [facility] P6/10/2020, 8:49 AM

## 2021-01-03 DIAGNOSIS — K56609 Unspecified intestinal obstruction, unspecified as to partial versus complete obstruction: Secondary | ICD-10-CM | POA: Diagnosis not present

## 2021-01-03 LAB — BASIC METABOLIC PANEL
Anion gap: 6 (ref 5–15)
BUN: 7 mg/dL (ref 6–20)
CO2: 22 mmol/L (ref 22–32)
Calcium: 8.3 mg/dL — ABNORMAL LOW (ref 8.9–10.3)
Chloride: 106 mmol/L (ref 98–111)
Creatinine, Ser: 0.66 mg/dL (ref 0.44–1.00)
GFR, Estimated: >60 mL/min (ref >60)
Glucose, Bld: 94 mg/dL (ref 70–99)
Potassium: 3.8 mmol/L (ref 3.5–5.1)
Sodium: 134 mmol/L — ABNORMAL LOW (ref 135–145)

## 2021-01-03 LAB — CBC
HCT: 40.6 % (ref 36.0–46.0)
Hemoglobin: 13.3 g/dL (ref 12.0–15.0)
MCH: 30.1 pg (ref 26.0–34.0)
MCHC: 32.8 g/dL (ref 30.0–36.0)
MCV: 91.9 fL (ref 80.0–100.0)
Platelets: 232 10*3/uL (ref 150–400)
RBC: 4.42 MIL/uL (ref 3.87–5.11)
RDW: 13.2 % (ref 11.5–15.5)
WBC: 5.9 10*3/uL (ref 4.0–10.5)
nRBC: 0 % (ref 0.0–0.2)

## 2021-01-03 NOTE — Progress Notes (Signed)
Subjective/Chief Complaint: Complains of mild soreness left side of abd. No N/V. Passing lots of flatus   Objective: Vital signs in last 24 hours: Temp:  [97.8 F (36.6 C)-98.4 F (36.9 C)] 98 F (36.7 C) (06/05 0447) Pulse Rate:  [67-91] 72 (06/05 0447) Resp:  [16-19] 16 (06/05 0447) BP: (130-154)/(92-108) 132/93 (06/05 0447) SpO2:  [95 %-100 %] 97 % (06/05 0447) Last BM Date: 01/02/21  Intake/Output from previous day: 06/04 0701 - 06/05 0700 In: 1315 [P.O.:480; I.V.:540.4; IV Piggyback:294.6] Out: -  Intake/Output this shift: No intake/output data recorded.  General appearance: alert and cooperative Resp: clear to auscultation bilaterally Cardio: regular rate and rhythm GI: soft, minimal tenderness. no distension  Lab Results:  Recent Labs    01/02/21 0607 01/03/21 0602  WBC 8.5 5.9  HGB 15.5* 13.3  HCT 48.1* 40.6  PLT 265 232   BMET Recent Labs    01/02/21 0607 01/03/21 0602  NA 140 134*  K 3.3* 3.8  CL 104 106  CO2 27 22  GLUCOSE 105* 94  BUN 17 7  CREATININE 0.80 0.66  CALCIUM 9.2 8.3*   PT/INR No results for input(s): LABPROT, INR in the last 72 hours. ABG No results for input(s): PHART, HCO3 in the last 72 hours.  Invalid input(s): PCO2, PO2  Studies/Results: CT ABDOMEN PELVIS WO CONTRAST  Result Date: 01/01/2021 CLINICAL DATA:  Abdominal pain with nausea and vomiting EXAM: CT ABDOMEN AND PELVIS WITHOUT CONTRAST TECHNIQUE: Multidetector CT imaging of the abdomen and pelvis was performed following the standard protocol without IV contrast. COMPARISON:  None. FINDINGS: Lower chest: Bullous disease is noted in the right middle lobe medially. No edema or airspace opacity in the lung base regions. Hepatobiliary: No focal liver lesions are appreciable on this noncontrast enhanced study. Gallbladder wall is not appreciably thickened. There is no biliary duct dilatation. Pancreas: No pancreatic mass or inflammatory focus. Note that there is prominence  of the pancreatic duct in the head and body region measuring up to 4 mm. No calculus or obstructing lesion seen on this noncontrast enhanced study in the pancreas. No peripancreatic fluid. Spleen: No splenic lesions are evident. Adrenals/Urinary Tract: Adrenals bilaterally appear normal. No evident renal mass or hydronephrosis on either side. There is a 1 mm calculus in the upper pole right kidney region. There is a 2 mm calculus in the mid to lower right kidney. No appreciable calculus in the left kidney. No ureteral calculus evident on either side. Urinary bladder is midline with wall thickness within normal limits. Stomach/Bowel: There is dilatation of the stomach. There is dilatation of the small bowel to the mid ileal level where there is transition zone consistent with a degree of small-bowel obstruction. Transition zone is best seen on axial slice 57 series 2 and coronal slice 39 series. No free air or portal venous air evident. Appendix appears normal. Terminal ileum appears normal. Note moderate stool throughout the colon. Colon is not dilated, and there is no colonic wall thickening. No appreciable diverticular disease. Vascular/Lymphatic: No evident abdominal aortic aneurysm. No vascular lesions evident on noncontrast enhanced study. No evident adenopathy in the abdomen pelvis. Reproductive: Uterus absent.  No adnexal masses evident. Other: There is moderate fluid in the posterior the portion of the pelvis. No other ascites. No evident abscess in the abdomen or pelvis. Musculoskeletal: No blastic or lytic bone lesions. Evidence of old trauma in the pubic symphysis region with remodeling. No intramuscular or abdominal wall lesions. IMPRESSION: 1. Evidence of small  bowel obstruction with transition zone in the mid ileal region. Distal small bowel appears normal. No colonic dilatation. No appreciable diverticular disease. 2.  Ascites in the dependent portion of the pelvis. 3. Dilatation of the pancreatic  duct in the head and proximal body region up to 4 mm. Pancreas otherwise appears normal. Nonemergent MR of the pancreas pre and post-contrast to further evaluate may be warranted given this localized pancreatic duct dilatation. No obstructing focus seen by noncontrast CT. 4. Small nonobstructing calculi in right kidney. No hydronephrosis or ureteral calculus on either side. Urinary bladder wall thickness normal. 5.  Absent uterus. 6. Appendix appears normal. No abscess evident in the abdomen or pelvis. Electronically Signed   By: Bretta Bang III M.D.   On: 01/01/2021 13:48   DG Abd 2 Views  Result Date: 01/02/2021 CLINICAL DATA:  Small bowel obstruction EXAM: ABDOMEN - 2 VIEW COMPARISON:  None. FINDINGS: No dilated loops of large or small bowel. Gas in the rectum. No pathologic calcifications. No organomegaly. No acute osseous abnormality. nipple shadows noted at lung bases. IMPRESSION: No acute abdominal findings.  No evidence of bowel obstruction. Electronically Signed   By: Genevive Bi M.D.   On: 01/02/2021 09:11    Anti-infectives: Anti-infectives (From admission, onward)   None      Assessment/Plan: s/p * No surgery found * Advance diet  sbo seems to be resolving. Tolerated liquids. Would advance diet Will sign off  LOS: 1 day    Chevis Pretty III 01/03/2021

## 2021-01-03 NOTE — Progress Notes (Signed)
Pt out in hall stating she has to leave now because her husband has left with her car. CRN and staff helping remove IV and printing off discharge paperwork for pt. Dr. Renford Dills notified. Pt accompanied by two staff members off floor and husband was downstairs to transport pt home.

## 2021-01-03 NOTE — Discharge Summary (Signed)
Physician Discharge Summary  Tamara Bradford OZH:[id number] DOB: 04/19/1976 DOA: 01/01/2021  PCP: Patient, No Pcp Per (Inactive)  Admit date: 01/01/2021 Discharge date: 01/03/2021  Admitted From: Home Disposition:  Home  Discharge Condition:Stable CODE STATUS:FULL Diet recommendation: Soft diet    Brief/Interim Summary: Patient is a 45 year old female with history of 5 prior cesarean section with bowel injury, substance abuse, alcohol abuse, asthma, postoperative ileus managed with multiple NG tube, tobacco abuse who presented to the emergency department with complaints of left-sided abdominal pain, nausea and vomiting.  On presentation she was afebrile, hemodynamically stable.  CT abdomen and pelvis showed SBO, ascites in the dependent portion of the pelvis, dilation of pancreatic duct in the head and proximal body up to 4 mm.  Patient was started on IV fluids, pain medications.  General surgery consulted.  Patient did not tolerate NG tube placement.  Patient's SBO spontaneously resolved, she is having bowel movements and tolerating diet.  Medically stable for discharge today.  Following problems were addressed during her hospitalization:   SBO: History of multiple abdominal surgeries, small bowel injury during hysterectomy 2020.  Presented with Abdomen Pain, nausea, vomiting.  General surgery following.  She did not tolerate NG tube placement on presentation.  Started on conservative management. Abdominal x-ray follow up does not show evidence of bowel obstruction.  She denies any  abdominal pain, nausea or vomiting today and tolerated advance diet.  Polysubstance abuse/alcohol abuse: Counseled for cessation.  UDS positive for opiates, amphetamines  Tobacco use: Smokes half packs a day.  Counseled for cessation  Mild intermittent asthma: She had mild wheezing on presentation.  Currently on room air.  Continue bronchodilators.  Continue albuterol  Hypokalemia: Supplemented with  potassium.  Hypertension: No history of hypertension.  Blood pressure currently stable.  Monitor at home.  Migraine headache: resolved with  migraine cocktail       Discharge Diagnoses:  Principal Problem:   SBO (small bowel obstruction) (HCC) Active Problems:   Mild intermittent asthma   Tobacco use disorder   History of 5 Prior Cesearean Sections - last 04/22/2018   Methamphetamine use (HCC)   Alcohol use disorder, severe, dependence (HCC)   Small bowel obstruction [facility](HCC)    Discharge Instructions  Discharge Instructions    Diet general   Complete by: As directed    Take soft diet for next 2-3 days   Discharge instructions   Complete by: As directed    1)Take soft diet for next 2 to 3 days 2)Follow up with a PCP in a week.   Increase activity slowly   Complete by: As directed      Allergies as of 01/03/2021      Reactions   Cinnamon Hives   Keflex [cephalexin] Hives   Naproxen Rash      Medication List    STOP taking these medications   predniSONE 20 MG tablet Commonly known as: DELTASONE   triamcinolone cream 0.1 % Commonly known as: KENALOG     TAKE these medications   acetaminophen 325 MG tablet Commonly known as: TYLENOL Take 650 mg by mouth every 6 (six) hours as needed for mild pain.   aspirin EC 81 MG tablet Take 1 tablet (81 mg total) by mouth daily. Take after 12 weeks for prevention of preeclampsia later in pregnancy   budesonide-formoterol 160-4.5 MCG/ACT inhaler Commonly known as: SYMBICORT Inhale into the lungs.   loratadine 10 MG tablet Commonly known as: CLARITIN Take 10 mg by mouth daily as needed for  allergies.   ProAir HFA 108 (90 Base) MCG/ACT inhaler Generic drug: albuterol Inhale 1 puff into the lungs every 4 (four) hours as needed for wheezing or shortness of breath. What changed: Another medication with the same name was removed. Continue taking this medication, and follow the directions you see here.        Allergies  Allergen Reactions  . Cinnamon Hives  . Keflex [Cephalexin] Hives  . Naproxen Rash    Consultations:  Surgery   Procedures/Studies: CT ABDOMEN PELVIS WO CONTRAST  Result Date: 01/01/2021 CLINICAL DATA:  Abdominal pain with nausea and vomiting EXAM: CT ABDOMEN AND PELVIS WITHOUT CONTRAST TECHNIQUE: Multidetector CT imaging of the abdomen and pelvis was performed following the standard protocol without IV contrast. COMPARISON:  None. FINDINGS: Lower chest: Bullous disease is noted in the right middle lobe medially. No edema or airspace opacity in the lung base regions. Hepatobiliary: No focal liver lesions are appreciable on this noncontrast enhanced study. Gallbladder wall is not appreciably thickened. There is no biliary duct dilatation. Pancreas: No pancreatic mass or inflammatory focus. Note that there is prominence of the pancreatic duct in the head and body region measuring up to 4 mm. No calculus or obstructing lesion seen on this noncontrast enhanced study in the pancreas. No peripancreatic fluid. Spleen: No splenic lesions are evident. Adrenals/Urinary Tract: Adrenals bilaterally appear normal. No evident renal mass or hydronephrosis on either side. There is a 1 mm calculus in the upper pole right kidney region. There is a 2 mm calculus in the mid to lower right kidney. No appreciable calculus in the left kidney. No ureteral calculus evident on either side. Urinary bladder is midline with wall thickness within normal limits. Stomach/Bowel: There is dilatation of the stomach. There is dilatation of the small bowel to the mid ileal level where there is transition zone consistent with a degree of small-bowel obstruction. Transition zone is best seen on axial slice 57 series 2 and coronal slice 39 series. No free air or portal venous air evident. Appendix appears normal. Terminal ileum appears normal. Note moderate stool throughout the colon. Colon is not dilated, and there is no  colonic wall thickening. No appreciable diverticular disease. Vascular/Lymphatic: No evident abdominal aortic aneurysm. No vascular lesions evident on noncontrast enhanced study. No evident adenopathy in the abdomen pelvis. Reproductive: Uterus absent.  No adnexal masses evident. Other: There is moderate fluid in the posterior the portion of the pelvis. No other ascites. No evident abscess in the abdomen or pelvis. Musculoskeletal: No blastic or lytic bone lesions. Evidence of old trauma in the pubic symphysis region with remodeling. No intramuscular or abdominal wall lesions. IMPRESSION: 1. Evidence of small bowel obstruction with transition zone in the mid ileal region. Distal small bowel appears normal. No colonic dilatation. No appreciable diverticular disease. 2.  Ascites in the dependent portion of the pelvis. 3. Dilatation of the pancreatic duct in the head and proximal body region up to 4 mm. Pancreas otherwise appears normal. Nonemergent MR of the pancreas pre and post-contrast to further evaluate may be warranted given this localized pancreatic duct dilatation. No obstructing focus seen by noncontrast CT. 4. Small nonobstructing calculi in right kidney. No hydronephrosis or ureteral calculus on either side. Urinary bladder wall thickness normal. 5.  Absent uterus. 6. Appendix appears normal. No abscess evident in the abdomen or pelvis. Electronically Signed   By: Bretta Bang III M.D.   On: 01/01/2021 13:48   DG Abd 2 Views  Result Date: 01/02/2021  CLINICAL DATA:  Small bowel obstruction EXAM: ABDOMEN - 2 VIEW COMPARISON:  None. FINDINGS: No dilated loops of large or small bowel. Gas in the rectum. No pathologic calcifications. No organomegaly. No acute osseous abnormality. nipple shadows noted at lung bases. IMPRESSION: No acute abdominal findings.  No evidence of bowel obstruction. Electronically Signed   By: Genevive Bi M.D.   On: 01/02/2021 09:11       Subjective:  Patient seen and  examined the bedside this morning.  Hemodynamically stable for discharge today.  I discussed this the discharge planning with the husband   Discharge Exam: Vitals:   01/03/21 0447 01/03/21 0758  BP: (!) 132/93   Pulse: 72   Resp: 16   Temp: 98 F (36.7 C)   SpO2: 97% 96%   Vitals:   01/02/21 1315 01/02/21 2051 01/03/21 0447 01/03/21 0758  BP: (!) 130/92 (!) 149/108 (!) 132/93   Pulse: 91 76 72   Resp: 19 16 16    Temp: 98.1 F (36.7 C) 97.8 F (36.6 C) 98 F (36.7 C)   TempSrc: Oral Oral Oral   SpO2: 98% 100% 97% 96%  Weight:      Height:        General: Pt is alert, awake, not in acute distress Cardiovascular: RRR, S1/S2 +, no rubs, no gallops Respiratory: CTA bilaterally, no wheezing, no rhonchi Abdominal: Soft, NT, ND, bowel sounds + Extremities: no edema, no cyanosis    The results of significant diagnostics from this hospitalization (including imaging, microbiology, ancillary and laboratory) are listed below for reference.     Microbiology: Recent Results (from the past 240 hour(s))  Resp Panel by RT-PCR (Flu A&B, Covid) Nasopharyngeal Swab     Status: None   Collection Time: 01/01/21  2:44 PM   Specimen: Nasopharyngeal Swab; Nasopharyngeal(NP) swabs in vial transport medium  Result Value Ref Range Status   SARS Coronavirus 2 by RT PCR NEGATIVE NEGATIVE Final    Comment: (NOTE) SARS-CoV-2 target nucleic acids are NOT DETECTED.  The SARS-CoV-2 RNA is generally detectable in upper respiratory specimens during the acute phase of infection. The lowest concentration of SARS-CoV-2 viral copies this assay can detect is 138 copies/mL. A negative result does not preclude SARS-Cov-2 infection and should not be used as the sole basis for treatment or other patient management decisions. A negative result may occur with  improper specimen collection/handling, submission of specimen other than nasopharyngeal swab, presence of viral mutation(s) within the areas targeted  by this assay, and inadequate number of viral copies(<138 copies/mL). A negative result must be combined with clinical observations, patient history, and epidemiological information. The expected result is Negative.  Fact Sheet for Patients:  03/03/21  Fact Sheet for Healthcare Providers:  BloggerCourse.com  This test is no t yet approved or cleared by the [place] FDA and  has been authorized for detection and/or diagnosis of SARS-CoV-2 by FDA under an Emergency Use Authorization (EUA). This EUA will remain  in effect (meaning this test can be used) for the duration of the COVID-19 declaration under Section 564(b)(1) of the Act, 21 U.S.C.section 360bbb-3(b)(1), unless the authorization is terminated  or revoked sooner.       Influenza A by PCR NEGATIVE NEGATIVE Final   Influenza B by PCR NEGATIVE NEGATIVE Final    Comment: (NOTE) The Xpert Xpress SARS-CoV-2/FLU/RSV plus assay is intended as an aid in the diagnosis of influenza from Nasopharyngeal swab specimens and should not be used as a sole basis for treatment.  Nasal washings and aspirates are unacceptable for Xpert Xpress SARS-CoV-2/FLU/RSV testing.  Fact Sheet for Patients: BloggerCourse.com  Fact Sheet for Healthcare Providers: [employer]it  This test is not yet approved or cleared by the [place] FDA and has been authorized for detection and/or diagnosis of SARS-CoV-2 by FDA under an Emergency Use Authorization (EUA). This EUA will remain in effect (meaning this test can be used) for the duration of the COVID-19 declaration under Section 564(b)(1) of the Act, 21 U.S.C. section 360bbb-3(b)(1), unless the authorization is terminated or revoked.  Performed at [facility], [address] [place], [place] [address]      Labs: BNP (last 3 results) No results for input(s): BNP  in the last 8760 hours. Basic Metabolic Panel: Recent Labs  Lab 01/01/21 1025 01/02/21 0607 01/03/21 0602  NA 140 140 134*  K 3.8 3.3* 3.8  CL 105 104 106  CO2 28 27 22   GLUCOSE 120* 105* 94  BUN 17 17 7   CREATININE 0.70 0.80 0.66  CALCIUM 9.5 9.2 8.3*   Liver Function Tests: Recent Labs  Lab 01/01/21 1025  AST 25  ALT 56*  ALKPHOS 104  BILITOT 0.8  PROT 7.9  ALBUMIN 3.9   Recent Labs  Lab 01/01/21 1025  LIPASE 20   No results for input(s): AMMONIA in the last 168 hours. CBC: Recent Labs  Lab 01/01/21 1025 01/02/21 0607 01/03/21 0602  WBC 9.4 8.5 5.9  NEUTROABS 6.0  --   --   HGB 14.7 15.5* 13.3  HCT 44.1 48.1* 40.6  MCV 88.9 91.8 91.9  PLT 289 265 232   Cardiac Enzymes: No results for input(s): CKTOTAL, CKMB, CKMBINDEX, TROPONINI in the last 168 hours. BNP: Invalid input(s): POCBNP CBG: No results for input(s): GLUCAP in the last 168 hours. D-Dimer No results for input(s): DDIMER in the last 72 hours. Hgb A1c No results for input(s): HGBA1C in the last 72 hours. Lipid Profile No results for input(s): CHOL, HDL, LDLCALC, TRIG, CHOLHDL, LDLDIRECT in the last 72 hours. Thyroid function studies No results for input(s): TSH, T4TOTAL, T3FREE, THYROIDAB in the last 72 hours.  Invalid input(s): FREET3 Anemia work up No results for input(s): VITAMINB12, FOLATE, FERRITIN, TIBC, IRON, RETICCTPCT in the last 72 hours. Urinalysis    Component Value Date/Time   COLORURINE YELLOW 01/01/2021 1605   APPEARANCEUR CLOUDY (A) 01/01/2021 1605   LABSPEC 1.020 01/01/2021 1605   PHURINE 6.0 01/01/2021 1605   GLUCOSEU NEGATIVE 01/01/2021 1605   HGBUR MODERATE (A) 01/01/2021 1605   BILIRUBINUR NEGATIVE 01/01/2021 1605   KETONESUR NEGATIVE 01/01/2021 1605   PROTEINUR NEGATIVE 01/01/2021 1605   UROBILINOGEN 1.0 04/18/2018 1530   NITRITE POSITIVE (A) 01/01/2021 1605   LEUKOCYTESUR NEGATIVE 01/01/2021 1605   Sepsis Labs Invalid input(s): PROCALCITONIN,  WBC,   LACTICIDVEN Microbiology Recent Results (from the past 240 hour(s))  Resp Panel by RT-PCR (Flu A&B, Covid) Nasopharyngeal Swab     Status: None   Collection Time: 01/01/21  2:44 PM   Specimen: Nasopharyngeal Swab; Nasopharyngeal(NP) swabs in vial transport medium  Result Value Ref Range Status   SARS Coronavirus 2 by RT PCR NEGATIVE NEGATIVE Final    Comment: (NOTE) SARS-CoV-2 target nucleic acids are NOT DETECTED.  The SARS-CoV-2 RNA is generally detectable in upper respiratory specimens during the acute phase of infection. The lowest concentration of SARS-CoV-2 viral copies this assay can detect is 138 copies/mL. A negative result does not preclude SARS-Cov-2 infection and should not be used as the sole  basis for treatment or other patient management decisions. A negative result may occur with  improper specimen collection/handling, submission of specimen other than nasopharyngeal swab, presence of viral mutation(s) within the areas targeted by this assay, and inadequate number of viral copies(<138 copies/mL). A negative result must be combined with clinical observations, patient history, and epidemiological information. The expected result is Negative.  Fact Sheet for Patients:  BloggerCourse.com  Fact Sheet for Healthcare Providers:  [employer]  This test is no t yet approved or cleared by the [place] FDA and  has been authorized for detection and/or diagnosis of SARS-CoV-2 by FDA under an Emergency Use Authorization (EUA). This EUA will remain  in effect (meaning this test can be used) for the duration of the COVID-19 declaration under Section 564(b)(1) of the Act, 21 U.S.C.section 360bbb-3(b)(1), unless the authorization is terminated  or revoked sooner.       Influenza A by PCR NEGATIVE NEGATIVE Final   Influenza B by PCR NEGATIVE NEGATIVE Final    Comment: (NOTE) The Xpert Xpress SARS-CoV-2/FLU/RSV plus  assay is intended as an aid in the diagnosis of influenza from Nasopharyngeal swab specimens and should not be used as a sole basis for treatment. Nasal washings and aspirates are unacceptable for Xpert Xpress SARS-CoV-2/FLU/RSV testing.  Fact Sheet for Patients: BloggerCourse.com  Fact Sheet for Healthcare Providers: [employer]it  This test is not yet approved or cleared by the [place] FDA and has been authorized for detection and/or diagnosis of SARS-CoV-2 by FDA under an Emergency Use Authorization (EUA). This EUA will remain in effect (meaning this test can be used) for the duration of the COVID-19 declaration under Section 564(b)(1) of the Act, 21 U.S.C. section 360bbb-3(b)(1), unless the authorization is terminated or revoked.  Performed at [facility], [address] [place], [place] [address]     Please note: You were cared for by a hospitalist during your hospital stay. Once you are discharged, your primary care physician will handle any further medical issues. Please note that NO REFILLS for any discharge medications will be authorized once you are discharged, as it is imperative that you return to your primary care physician (or establish a relationship with a primary care physician if you do not have one) for your post hospital discharge needs so that they can reassess your need for medications and monitor your lab values.    Time coordinating discharge: 40 minutes  SIGNED:   Burnadette Pop, MD  [facility] 01/03/2021, 10:42 AM Pager [phone]  If 7PM-7AM, please contact night-coverage [email or web address] Password [email or web address]

## 2021-01-29 DIAGNOSIS — Z5181 Encounter for therapeutic drug level monitoring: Secondary | ICD-10-CM | POA: Diagnosis not present

## 2021-02-08 DIAGNOSIS — Z5181 Encounter for therapeutic drug level monitoring: Secondary | ICD-10-CM | POA: Diagnosis not present

## 2021-02-10 DIAGNOSIS — Z5181 Encounter for therapeutic drug level monitoring: Secondary | ICD-10-CM | POA: Diagnosis not present

## 2021-02-15 DIAGNOSIS — Z5181 Encounter for therapeutic drug level monitoring: Secondary | ICD-10-CM | POA: Diagnosis not present

## 2021-02-17 DIAGNOSIS — Z5181 Encounter for therapeutic drug level monitoring: Secondary | ICD-10-CM | POA: Diagnosis not present

## 2021-02-22 DIAGNOSIS — Z5181 Encounter for therapeutic drug level monitoring: Secondary | ICD-10-CM | POA: Diagnosis not present

## 2021-02-24 DIAGNOSIS — Z5181 Encounter for therapeutic drug level monitoring: Secondary | ICD-10-CM | POA: Diagnosis not present

## 2021-03-01 DIAGNOSIS — Z5181 Encounter for therapeutic drug level monitoring: Secondary | ICD-10-CM | POA: Diagnosis not present

## 2021-03-03 DIAGNOSIS — Z5181 Encounter for therapeutic drug level monitoring: Secondary | ICD-10-CM | POA: Diagnosis not present

## 2021-03-08 DIAGNOSIS — Z5181 Encounter for therapeutic drug level monitoring: Secondary | ICD-10-CM | POA: Diagnosis not present

## 2021-03-10 DIAGNOSIS — Z5181 Encounter for therapeutic drug level monitoring: Secondary | ICD-10-CM | POA: Diagnosis not present

## 2021-03-15 DIAGNOSIS — Z5181 Encounter for therapeutic drug level monitoring: Secondary | ICD-10-CM | POA: Diagnosis not present

## 2021-03-17 DIAGNOSIS — Z5181 Encounter for therapeutic drug level monitoring: Secondary | ICD-10-CM | POA: Diagnosis not present

## 2021-03-22 DIAGNOSIS — Z5181 Encounter for therapeutic drug level monitoring: Secondary | ICD-10-CM | POA: Diagnosis not present

## 2021-03-24 DIAGNOSIS — Z5181 Encounter for therapeutic drug level monitoring: Secondary | ICD-10-CM | POA: Diagnosis not present

## 2021-03-29 DIAGNOSIS — Z5181 Encounter for therapeutic drug level monitoring: Secondary | ICD-10-CM | POA: Diagnosis not present

## 2021-03-31 DIAGNOSIS — Z5181 Encounter for therapeutic drug level monitoring: Secondary | ICD-10-CM | POA: Diagnosis not present

## 2021-04-06 DIAGNOSIS — Z5181 Encounter for therapeutic drug level monitoring: Secondary | ICD-10-CM | POA: Diagnosis not present

## 2021-04-07 DIAGNOSIS — J45901 Unspecified asthma with (acute) exacerbation: Secondary | ICD-10-CM | POA: Diagnosis not present

## 2021-04-07 DIAGNOSIS — R0602 Shortness of breath: Secondary | ICD-10-CM | POA: Diagnosis not present

## 2021-04-07 DIAGNOSIS — Z20822 Contact with and (suspected) exposure to covid-19: Secondary | ICD-10-CM | POA: Diagnosis not present

## 2021-04-07 DIAGNOSIS — R079 Chest pain, unspecified: Secondary | ICD-10-CM | POA: Diagnosis not present

## 2021-04-08 DIAGNOSIS — Z5181 Encounter for therapeutic drug level monitoring: Secondary | ICD-10-CM | POA: Diagnosis not present

## 2021-04-12 DIAGNOSIS — Z5181 Encounter for therapeutic drug level monitoring: Secondary | ICD-10-CM | POA: Diagnosis not present

## 2021-04-14 DIAGNOSIS — Z5181 Encounter for therapeutic drug level monitoring: Secondary | ICD-10-CM | POA: Diagnosis not present

## 2021-04-19 DIAGNOSIS — Z5181 Encounter for therapeutic drug level monitoring: Secondary | ICD-10-CM | POA: Diagnosis not present

## 2021-04-21 DIAGNOSIS — Z5181 Encounter for therapeutic drug level monitoring: Secondary | ICD-10-CM | POA: Diagnosis not present

## 2021-08-09 ENCOUNTER — Ambulatory Visit: Payer: Medicaid Other | Admitting: Podiatry

## 2021-08-10 ENCOUNTER — Other Ambulatory Visit: Payer: Self-pay

## 2021-08-10 ENCOUNTER — Ambulatory Visit (INDEPENDENT_AMBULATORY_CARE_PROVIDER_SITE_OTHER): Payer: Medicaid Other | Admitting: Podiatry

## 2021-08-10 DIAGNOSIS — L988 Other specified disorders of the skin and subcutaneous tissue: Secondary | ICD-10-CM

## 2021-08-10 DIAGNOSIS — B353 Tinea pedis: Secondary | ICD-10-CM

## 2021-08-10 MED ORDER — TRIAMCINOLONE ACETONIDE 0.025 % EX OINT: 1.0000 "application " | TOPICAL_OINTMENT | Freq: Two times a day (BID) | CUTANEOUS | 0 refills | Status: DC

## 2021-08-10 MED ORDER — FLUCONAZOLE 150 MG PO TABS: 150.0000 mg | ORAL_TABLET | ORAL | 0 refills | Status: DC

## 2021-08-10 MED ORDER — CICLOPIROX 0.77 % EX GEL: 1.0000 "application " | Freq: Two times a day (BID) | CUTANEOUS | 0 refills | Status: DC

## 2021-08-15 NOTE — Progress Notes (Addendum)
Subjective: 46 year old female presents the office today for concerns of reoccurrence of a rash to the right foot as well as for calluses.  I last saw her April 01, 2021 and at that point I prescribed ketoconazole as well as triamcinolone cream.  She states that medications helped and the symptoms completely resolved with come back.  She states that her skin stays dry as well.  No recent injuries. No drainage or pus or open sores.  No other concerns.  No fevers or chills that she reports.  Objective: AAO x3, NAD DP/PT pulses palpable bilaterally, CRT less than 3 seconds Dry skin present bilaterally and there is arch of the foot as well.  Interdigitally there is macerated tissue.  She is been applying Vaseline she states in between her toes and wearing socks.  There is no drainage or pus.  There is no open sores.  There is callus formation present to the plantar aspect of the feet but there is no signs of infection with this.  No pain with calf compression, swelling, warmth, erythema  Assessment: Tinea pedis, skin maceration  Plan: -All treatment options discussed with the patient including all alternatives, risks, complications.  -Advised her not to apply lotions or Vaseline in between her toes.  I applied Betadine interdigitally to help.  Ciclopirox gel ordered.  For the skin on the arch of the foot prescribed triamcinolone cream. -I did offer to trim the calluses today. -Patient encouraged to call the office with any questions, concerns, change in symptoms.

## 2021-09-19 ENCOUNTER — Other Ambulatory Visit: Payer: Self-pay

## 2021-09-19 ENCOUNTER — Emergency Department (HOSPITAL_COMMUNITY): Payer: Medicaid Other

## 2021-09-19 ENCOUNTER — Encounter (HOSPITAL_COMMUNITY): Payer: Self-pay | Admitting: Emergency Medicine

## 2021-09-19 ENCOUNTER — Emergency Department (HOSPITAL_COMMUNITY)
Admission: EM | Admit: 2021-09-19 | Discharge: 2021-09-19 | Disposition: A | Discharge status: Home / Self Care | Payer: Medicaid Other | Attending: Emergency Medicine | Admitting: Emergency Medicine

## 2021-09-19 DIAGNOSIS — Z7982 Long term (current) use of aspirin: Secondary | ICD-10-CM | POA: Insufficient documentation

## 2021-09-19 DIAGNOSIS — R0789 Other chest pain: Secondary | ICD-10-CM | POA: Diagnosis not present

## 2021-09-19 DIAGNOSIS — Z7951 Long term (current) use of inhaled steroids: Secondary | ICD-10-CM | POA: Insufficient documentation

## 2021-09-19 DIAGNOSIS — J3489 Other specified disorders of nose and nasal sinuses: Secondary | ICD-10-CM | POA: Insufficient documentation

## 2021-09-19 DIAGNOSIS — R9431 Abnormal electrocardiogram [ECG] [EKG]: Secondary | ICD-10-CM | POA: Diagnosis not present

## 2021-09-19 DIAGNOSIS — R059 Cough, unspecified: Secondary | ICD-10-CM | POA: Diagnosis not present

## 2021-09-19 DIAGNOSIS — J45909 Unspecified asthma, uncomplicated: Secondary | ICD-10-CM | POA: Diagnosis not present

## 2021-09-19 DIAGNOSIS — Z20822 Contact with and (suspected) exposure to covid-19: Secondary | ICD-10-CM | POA: Insufficient documentation

## 2021-09-19 DIAGNOSIS — B9789 Other viral agents as the cause of diseases classified elsewhere: Secondary | ICD-10-CM | POA: Diagnosis not present

## 2021-09-19 DIAGNOSIS — J069 Acute upper respiratory infection, unspecified: Secondary | ICD-10-CM | POA: Diagnosis not present

## 2021-09-19 DIAGNOSIS — R509 Fever, unspecified: Secondary | ICD-10-CM | POA: Diagnosis not present

## 2021-09-19 LAB — RESP PANEL BY RT-PCR (FLU A&B, COVID) ARPGX2
Influenza A by PCR: NEGATIVE
Influenza B by PCR: NEGATIVE
SARS Coronavirus 2 by RT PCR: NEGATIVE

## 2021-09-19 MED ORDER — BENZONATATE 100 MG PO CAPS: 100.0000 mg | ORAL_CAPSULE | Freq: Three times a day (TID) | ORAL | 0 refills | Status: DC

## 2021-09-19 MED ORDER — ONDANSETRON 4 MG PO TBDP
4.0000 mg | ORAL_TABLET | Freq: Once | ORAL | Status: AC
  Administered 2021-09-19: 4 mg via ORAL
  Filled 2021-09-19: qty 1

## 2021-09-19 MED ORDER — PREDNISONE 20 MG PO TABS
60.0000 mg | ORAL_TABLET | Freq: Once | ORAL | Status: AC
  Administered 2021-09-19: 60 mg via ORAL
  Filled 2021-09-19: qty 3

## 2021-09-19 MED ORDER — IPRATROPIUM-ALBUTEROL 0.5-2.5 (3) MG/3ML IN SOLN
3.0000 mL | Freq: Once | RESPIRATORY_TRACT | Status: AC
  Administered 2021-09-19: 3 mL via RESPIRATORY_TRACT
  Filled 2021-09-19: qty 3

## 2021-09-19 NOTE — ED Provider Notes (Signed)
[facility]-EMERGENCY DEPT Provider Note   CSN: [id number] Arrival date & time: 09/19/21  1643     History  Chief Complaint  Patient presents with   Fever   Nasal Congestion    Tamara Bradford is a 46 y.o. female.  Patient with history of substance abuse and asthma presents today with complaints of fever, cough, congestion, and generalized body aches.  She states that her symptoms have been ongoing for the last 3 days, she has been attempting to manage with over-the-counter analgesia and with some relief.  She arrives in police custody today, police were attempting to transfer her to jail, however she was found to have a fever and they are not allowed to take someone to jail with a fever so they brought her here for evaluation. Also endorses some intermittent nausea and vomiting, last episode of vomiting was this morning. Last bowel movement was earlier today and was normal. Patient denies any chest pain or shortness of breath.  The history is provided by the patient. No language interpreter was used.  Fever Associated symptoms: congestion, cough, nausea (since resolved), rhinorrhea and vomiting (since resolved)   Associated symptoms: no chest pain, no confusion, no diarrhea, no headaches, no rash and no sore throat       Home Medications Prior to Admission medications   Medication Sig Start Date End Date Taking? Authorizing Provider  acetaminophen (TYLENOL) 325 MG tablet Take 650 mg by mouth every 6 (six) hours as needed for mild pain. 02/26/19   [provider]  aspirin EC 81 MG tablet Take 1 tablet (81 mg total) by mouth daily. Take after 12 weeks for prevention of preeclampsia later in pregnancy 10/22/18   Anyanwu, Jethro Bastos, MD  budesonide-formoterol (SYMBICORT) 160-4.5 MCG/ACT inhaler Inhale into the lungs. 02/26/19   [provider]  Ciclopirox 0.77 % gel Apply 1 application topically 2 (two) times daily. Apply between toes 08/10/21   Vivi Barrack, DPM  fluconazole (DIFLUCAN) 150 MG tablet Take 1 tablet (150 mg total) by mouth once a week. 08/10/21   Vivi Barrack, DPM  loratadine (CLARITIN) 10 MG tablet Take 10 mg by mouth daily as needed for allergies.    [provider]  PROAIR HFA 108 [phone] Base) MCG/ACT inhaler Inhale 1 puff into the lungs every 4 (four) hours as needed for wheezing or shortness of breath. 12/20/20   [provider]  triamcinolone (KENALOG) 0.025 % ointment Apply 1 application topically 2 (two) times daily. 08/10/21   Vivi Barrack, DPM      Allergies    Cinnamon, Keflex [cephalexin], and Naproxen    Review of Systems   Review of Systems  Constitutional:  Positive for fever.  HENT:  Positive for congestion and rhinorrhea. Negative for sore throat, trouble swallowing and voice change.   Respiratory:  Positive for cough. Negative for shortness of breath and stridor.   Cardiovascular:  Negative for chest pain.  Gastrointestinal:  Positive for nausea (since resolved) and vomiting (since resolved). Negative for abdominal distention, abdominal pain, constipation and diarrhea.  Skin:  Negative for rash.  Neurological:  Negative for dizziness, tremors, seizures, syncope, facial asymmetry, speech difficulty, weakness, light-headedness, numbness and headaches.  Psychiatric/Behavioral:  Negative for confusion and decreased concentration.   All other systems reviewed and are negative.  Physical Exam Updated Vital Signs BP (!) 158/95 (BP Location: Right Arm)    Pulse 96    Temp 97.6 F (36.4 C) (Oral)  Resp 18    Ht 5\' 7"  (1.702 m)    Wt 59 kg    LMP 06/05/2018    SpO2 99%    BMI 20.36 kg/m  Physical Exam Vitals and nursing note reviewed.  Constitutional:      General: She is not in acute distress.    Appearance: Normal appearance. She is normal weight. She is not ill-appearing, toxic-appearing or diaphoretic.     Comments: Patient resting comfortably in bed in no acute distress   HENT:     Head: Normocephalic and atraumatic.  Eyes:     Extraocular Movements: Extraocular movements intact.     Pupils: Pupils are equal, round, and reactive to light.  Cardiovascular:     Rate and Rhythm: Normal rate and regular rhythm.     Heart sounds: Normal heart sounds.  Pulmonary:     Effort: Pulmonary effort is normal. No respiratory distress.     Breath sounds: Wheezing present.  Abdominal:     General: Abdomen is flat.     Palpations: Abdomen is soft.     Tenderness: There is no abdominal tenderness.  Musculoskeletal:        General: Normal range of motion.     Cervical back: Normal range of motion and neck supple.     Right lower leg: No edema.     Left lower leg: No edema.  Skin:    General: Skin is warm and dry.  Neurological:     General: No focal deficit present.     Mental Status: She is alert.  Psychiatric:        Mood and Affect: Mood normal.        Behavior: Behavior normal.    ED Results / Procedures / Treatments   Labs (all labs ordered are listed, but only abnormal results are displayed) Labs Reviewed  RESP PANEL BY RT-PCR (FLU A&B, COVID) [id number]    EKG EKG Interpretation  Date/Time:  Sunday September 19 2021 17:33:22 EST Ventricular Rate:  79 PR Interval:  156 QRS Duration: 81 QT Interval:  391 QTC Calculation: 449 R Axis:   83 Text Interpretation: Sinus rhythm Anterior infarct, old Nonspecific T abnormalities, lateral leads since last tracing no significant change Confirmed by [id number] [phone]) on 09/19/2021 7:16:23 PM  Radiology DG Chest 2 View  Result Date: 09/19/2021 CLINICAL DATA:  Cough and fevers for several days, initial encounter EXAM: CHEST - 2 VIEW COMPARISON:  05/02/2019 FINDINGS: The heart size and mediastinal contours are within normal limits. Both lungs are clear. The visualized skeletal structures are unremarkable. Bilateral nipple shadows are noted. IMPRESSION: No active cardiopulmonary disease. Electronically Signed    By: 07/02/2019 M.D.   On: 09/19/2021 19:04    Procedures Procedures    Medications Ordered in ED Medications  ipratropium-albuterol (DUONEB) 0.5-2.5 (3) MG/3ML nebulizer solution 3 mL (3 mLs Nebulization Given 09/19/21 1845)  predniSONE (DELTASONE) tablet 60 mg (60 mg Oral Given 09/19/21 1843)  ondansetron (ZOFRAN-ODT) disintegrating tablet 4 mg (4 mg Oral Given 09/19/21 1843)    ED Course/ Medical Decision Making/ A&P                           Medical Decision Making Amount and/or Complexity of Data Reviewed Radiology: ordered.  Risk Prescription drug management.   Patient with history of asthma presents today with complaints of URI symptoms including cough and congestion. Patient brought in by police, was supposed to go to  jail today however was found to have a fever and subsequently brought here for evaluation.  I, Lurena Nida, PA-C, personally reviewed and evaluated these images and lab results supported by medical decision making  Patients CXR without acute findings. EKG unchanged from previous, no STEMI. COVID and flu pending, she has been educated on how to review these results on her mychart.  Patient found to have some wheezing on exam consistent with asthma history which is significantly improved with duoneb and prednisone.  Upon reevaluation, patient without any further episodes of nausea or vomiting since arriving here. She is observed to be drinking water without difficulty or emesis. Pt CXR negative for acute infiltrate. Patients symptoms are consistent with URI, likely viral etiology. Discussed that antibiotics are not indicated for viral infections. She is afebrile, non-toxic appearing, and in no acute distress with reassuring vital signs. She is stable for discharge at this time. Pt will be discharged with symptomatic treatment.  Verbalizes understanding and is agreeable with plan. Pt is hemodynamically stable & in NAD prior to dc. Discharged in stable  condition.   Final Clinical Impression(s) / ED Diagnoses Final diagnoses:  Viral URI with cough    Rx / DC Orders ED Discharge Orders          Ordered    benzonatate (TESSALON) 100 MG capsule  Every 8 hours        09/19/21 1958          An After Visit Summary was printed and given to the patient.     Vear Clock 09/19/21 Drucilla Schmidt, MD 09/19/21 [phone]

## 2021-09-19 NOTE — ED Triage Notes (Signed)
46 yo female BIBA [facility] c/o fever congestion, nausea and vomiting x 3 days. Pt states last reported fever was 100. Pt is also reporting body aches. No other complaints at this time

## 2021-09-19 NOTE — Discharge Instructions (Addendum)
As we discussed, your work-up in the ER was reassuring for acute abnormalities. I suspect that your symptoms are due to an upper respiratory infection. As these are almost always viral in nature, no antibiotics are indicated. I recommend management with Tylenol and ibuprofen for fevers with OTC cold medication and your albuterol inhaler for asthma symptoms. I have also given you a prescription for tessalon which is a cough suppressant to help with your symptoms. Please take this as prescribed as needed.  Return if development of any new or worsening symptoms.

## 2021-09-21 ENCOUNTER — Telehealth: Payer: Self-pay

## 2021-09-21 NOTE — Telephone Encounter (Signed)
Transition Care Management Unsuccessful Follow-up Telephone Call  Date of discharge and from where:  09/19/2021 from [facility]  Attempts:  1st Attempt  Reason for unsuccessful TCM follow-up call:  No answer/busy

## 2021-09-23 NOTE — Telephone Encounter (Signed)
Transition Care Management Unsuccessful Follow-up Telephone Call  Date of discharge and from where:  09/19/2021 from [facility]  Attempts:  2nd Attempt  Reason for unsuccessful TCM follow-up call:  No answer/busy

## 2021-09-24 NOTE — Telephone Encounter (Signed)
Transition Care Management Unsuccessful Follow-up Telephone Call  Date of discharge and from where:  09/19/2021-[facility]  Attempts:  3rd Attempt  Reason for unsuccessful TCM follow-up call:  No answer/busy

## 2021-10-03 ENCOUNTER — Emergency Department (HOSPITAL_COMMUNITY)
Admission: EM | Admit: 2021-10-03 | Discharge: 2021-10-03 | Disposition: A | Discharge status: Home / Self Care | Payer: Medicaid Other | Attending: Emergency Medicine | Admitting: Emergency Medicine

## 2021-10-03 ENCOUNTER — Other Ambulatory Visit: Payer: Self-pay

## 2021-10-03 ENCOUNTER — Emergency Department (HOSPITAL_COMMUNITY): Payer: Medicaid Other

## 2021-10-03 DIAGNOSIS — N201 Calculus of ureter: Secondary | ICD-10-CM | POA: Diagnosis not present

## 2021-10-03 DIAGNOSIS — R1030 Lower abdominal pain, unspecified: Secondary | ICD-10-CM | POA: Diagnosis not present

## 2021-10-03 DIAGNOSIS — Z7982 Long term (current) use of aspirin: Secondary | ICD-10-CM | POA: Diagnosis not present

## 2021-10-03 DIAGNOSIS — K5900 Constipation, unspecified: Secondary | ICD-10-CM | POA: Diagnosis not present

## 2021-10-03 DIAGNOSIS — R1084 Generalized abdominal pain: Secondary | ICD-10-CM | POA: Diagnosis present

## 2021-10-03 DIAGNOSIS — N132 Hydronephrosis with renal and ureteral calculous obstruction: Secondary | ICD-10-CM | POA: Insufficient documentation

## 2021-10-03 DIAGNOSIS — K8689 Other specified diseases of pancreas: Secondary | ICD-10-CM | POA: Diagnosis not present

## 2021-10-03 DIAGNOSIS — N1330 Unspecified hydronephrosis: Secondary | ICD-10-CM | POA: Diagnosis not present

## 2021-10-03 LAB — COMPREHENSIVE METABOLIC PANEL
ALT: 17 U/L (ref 0–44)
AST: 15 U/L (ref 15–41)
Albumin: 3.7 g/dL (ref 3.5–5.0)
Alkaline Phosphatase: 66 U/L (ref 38–126)
Anion gap: 6 (ref 5–15)
BUN: 7 mg/dL (ref 6–20)
CO2: 26 mmol/L (ref 22–32)
Calcium: 8.5 mg/dL — ABNORMAL LOW (ref 8.9–10.3)
Chloride: 104 mmol/L (ref 98–111)
Creatinine, Ser: 0.65 mg/dL (ref 0.44–1.00)
GFR, Estimated: >60 mL/min (ref >60)
Glucose, Bld: 92 mg/dL (ref 70–99)
Potassium: 4.2 mmol/L (ref 3.5–5.1)
Sodium: 136 mmol/L (ref 135–145)
Total Bilirubin: 0.3 mg/dL (ref 0.3–1.2)
Total Protein: 7.2 g/dL (ref 6.5–8.1)

## 2021-10-03 LAB — CBC WITH DIFFERENTIAL/PLATELET
Abs Immature Granulocytes: 0.02 10*3/uL (ref 0.00–0.07)
Basophils Absolute: 0 10*3/uL (ref 0.0–0.1)
Basophils Relative: 1 %
Eosinophils Absolute: 0.2 10*3/uL (ref 0.0–0.5)
Eosinophils Relative: 4 %
HCT: 38.3 % (ref 36.0–46.0)
Hemoglobin: 12.5 g/dL (ref 12.0–15.0)
Immature Granulocytes: 0 %
Lymphocytes Relative: 41 %
Lymphs Abs: 2.7 10*3/uL (ref 0.7–4.0)
MCH: 30 pg (ref 26.0–34.0)
MCHC: 32.6 g/dL (ref 30.0–36.0)
MCV: 91.8 fL (ref 80.0–100.0)
Monocytes Absolute: 0.6 10*3/uL (ref 0.1–1.0)
Monocytes Relative: 9 %
Neutro Abs: 3 10*3/uL (ref 1.7–7.7)
Neutrophils Relative %: 45 %
Platelets: 278 10*3/uL (ref 150–400)
RBC: 4.17 MIL/uL (ref 3.87–5.11)
RDW: 12.8 % (ref 11.5–15.5)
WBC: 6.5 10*3/uL (ref 4.0–10.5)
nRBC: 0 % (ref 0.0–0.2)

## 2021-10-03 LAB — URINALYSIS, ROUTINE W REFLEX MICROSCOPIC
Bilirubin Urine: NEGATIVE
Glucose, UA: NEGATIVE mg/dL
Hgb urine dipstick: NEGATIVE
Ketones, ur: NEGATIVE mg/dL
Leukocytes,Ua: NEGATIVE
Nitrite: NEGATIVE
Protein, ur: NEGATIVE mg/dL
Specific Gravity, Urine: 1.016 (ref 1.005–1.030)
pH: 7 (ref 5.0–8.0)

## 2021-10-03 LAB — LIPASE, BLOOD: Lipase: 24 U/L (ref 11–51)

## 2021-10-03 MED ORDER — HYDROCORTISONE (PERIANAL) 2.5 % EX CREA: 1.0000 "application " | TOPICAL_CREAM | Freq: Two times a day (BID) | CUTANEOUS | 0 refills | Status: DC

## 2021-10-03 MED ORDER — ONDANSETRON HCL 4 MG/2ML IJ SOLN
4.0000 mg | Freq: Once | INTRAMUSCULAR | Status: AC
  Administered 2021-10-03: 4 mg via INTRAVENOUS
  Filled 2021-10-03: qty 2

## 2021-10-03 MED ORDER — SORBITOL 70 % SOLN
960.0000 mL | TOPICAL_OIL | Freq: Once | ORAL | Status: AC
  Administered 2021-10-03: 960 mL via RECTAL
  Filled 2021-10-03: qty 473

## 2021-10-03 MED ORDER — MORPHINE SULFATE (PF) 4 MG/ML IV SOLN
4.0000 mg | Freq: Once | INTRAVENOUS | Status: AC
  Administered 2021-10-03: 4 mg via INTRAVENOUS
  Filled 2021-10-03: qty 1

## 2021-10-03 MED ORDER — HYDROMORPHONE HCL 1 MG/ML IJ SOLN
0.5000 mg | Freq: Once | INTRAMUSCULAR | Status: AC
  Administered 2021-10-03: 0.5 mg via INTRAVENOUS
  Filled 2021-10-03: qty 1

## 2021-10-03 MED ORDER — OXYCODONE HCL 5 MG PO TABS: 5.0000 mg | ORAL_TABLET | ORAL | 0 refills | Status: DC | PRN

## 2021-10-03 MED ORDER — LIDOCAINE HCL URETHRAL/MUCOSAL 2 % EX GEL
1.0000 "application " | Freq: Once | CUTANEOUS | Status: AC
  Administered 2021-10-03: 1 via TOPICAL
  Filled 2021-10-03: qty 11

## 2021-10-03 MED ORDER — ONDANSETRON 4 MG PO TBDP
4.0000 mg | ORAL_TABLET | Freq: Three times a day (TID) | ORAL | 0 refills | Status: DC | PRN
Start: 2021-10-03 — End: 2023-11-27

## 2021-10-03 MED ORDER — GLYCERIN (ADULT) 2 G RE SUPP: 1.0000 | RECTAL | 0 refills | Status: DC | PRN

## 2021-10-03 MED ORDER — SODIUM CHLORIDE 0.9 % IV BOLUS
1000.0000 mL | Freq: Once | INTRAVENOUS | Status: AC
  Administered 2021-10-03: 1000 mL via INTRAVENOUS

## 2021-10-03 MED ORDER — IOHEXOL 300 MG/ML  SOLN
100.0000 mL | Freq: Once | INTRAMUSCULAR | Status: AC | PRN
  Administered 2021-10-03: 100 mL via INTRAVENOUS

## 2021-10-03 MED ORDER — POLYETHYLENE GLYCOL 3350 17 GM/SCOOP PO POWD
1.0000 | Freq: Once | ORAL | 0 refills | Status: AC
Start: 2021-10-03 — End: 2021-10-03

## 2021-10-03 MED ORDER — KETOROLAC TROMETHAMINE 30 MG/ML IJ SOLN
30.0000 mg | Freq: Once | INTRAMUSCULAR | Status: AC
  Administered 2021-10-03: 30 mg via INTRAVENOUS
  Filled 2021-10-03: qty 1

## 2021-10-03 MED ORDER — TAMSULOSIN HCL 0.4 MG PO CAPS: 0.4000 mg | ORAL_CAPSULE | Freq: Every day | ORAL | 0 refills | Status: AC

## 2021-10-03 NOTE — ED Triage Notes (Addendum)
Pt BIB [facility] from [facility]. Pt c/o abdominal pain 5X days, last BM 9X days. Pt states hx of bowel obstruction following birth of 20th child. Pt has not been able to tolerate foods for 3X days. Reports emesis yesterday that had fecal odor. ? ?Hx 5 c-sections ?AXO4 ?In police custody ?

## 2021-10-03 NOTE — ED Provider Notes (Signed)
[facility]-EMERGENC[facility] DEPT Provider Note   CSN: [id number] Arrival date & time: 10/03/21  1621     History  Chief Complaint  Patient presents with   Abdominal Pain   Constipation    Tamara Bradford is a 46 y.o. female here in [employer] custody history of prior SBO here for lower abdominal pain and constipation.  Some emesis yesterday, none today however has not attempted to tolerate p.o. intake.  Last bowel movement 9 days ago, abdominal pain over the last 5 days.  Described as cramping in nature.  Also recently diagnosed with UTI while incarcerated.  Started on Macrobid.  States she has done "a few things" for constipation however does not work.  He is unsure if she is passing flatus.  Stated her emesis yesterday "snot really bad."  She denies any bloody emesis.  Typically bowel movements every other day.  No fever, chest pain, shortness of breath, back pain, rectal bleeding.  History of hysterectomy.  Unsure if this feels similar to her prior SBO. Denies pelvic pain, vag dc or concern for STD.  HPI     Home Medications Prior to Admission medications   Medication Sig Start Date End Date Taking? Authorizing Provider  glycerin adult 2 g suppository Place 1 suppository rectally as needed for constipation. 10/03/21  Yes Hannah Strader A, PA-C  hydrocortisone (ANUSOL-HC) 2.5 % rectal cream Place 1 application rectally 2 (two) times daily. 10/03/21  Yes Ayinde Swim A, PA-C  ondansetron (ZOFRAN-ODT) 4 MG disintegrating tablet Take 1 tablet (4 mg total) by mouth every 8 (eight) hours as needed for nausea or vomiting. 10/03/21  Yes Angeni Chaudhuri A, PA-C  oxyCODONE (ROXICODONE) 5 MG immediate release tablet Take 1 tablet (5 mg total) by mouth every 4 (four) hours as needed for severe pain. 10/03/21  Yes Jessica Checketts A, PA-C  polyethylene glycol powder (MIRALAX) 17 GM/SCOOP powder Take 255 g by mouth once for 1 dose. 10/03/21 10/03/21 Yes Ankur Snowdon A, PA-C  tamsulosin  (FLOMAX) 0.4 MG CAPS capsule Take 1 capsule (0.4 mg total) by mouth daily for 7 days. 10/03/21 10/10/21 Yes Olufemi Mofield A, PA-C  acetaminophen (TYLENOL) 325 MG tablet Take 650 mg by mouth every 6 (six) hours as needed for mild pain. 02/26/19   [provider]  aspirin EC 81 MG tablet Take 1 tablet (81 mg total) by mouth daily. Take after 12 weeks for prevention of preeclampsia later in pregnancy 10/22/18   Anyanwu, Jethro Bastos, MD  benzonatate (TESSALON) 100 MG capsule Take 1 capsule (100 mg total) by mouth every 8 (eight) hours. 09/19/21   Smoot, Shawn Route, PA-C  budesonide-formoterol (SYMBICORT) 160-4.5 MCG/ACT inhaler Inhale into the lungs. 02/26/19   [provider]  Ciclopirox 0.77 % gel Apply 1 application topically 2 (two) times daily. Apply between toes 08/10/21   Vivi Barrack, DPM  fluconazole (DIFLUCAN) 150 MG tablet Take 1 tablet (150 mg total) by mouth once a week. 08/10/21   Vivi Barrack, DPM  loratadine (CLARITIN) 10 MG tablet Take 10 mg by mouth daily as needed for allergies.    [provider]  PROAIR HFA 108 [phone] Base) MCG/ACT inhaler Inhale 1 puff into the lungs every 4 (four) hours as needed for wheezing or shortness of breath. 12/20/20   [provider]  triamcinolone (KENALOG) 0.025 % ointment Apply 1 application topically 2 (two) times daily. 08/10/21   Vivi Barrack, DPM      Allergies  Cinnamon, Keflex [cephalexin], and Naproxen    Review of Systems   Review of Systems  Constitutional: Negative.   HENT: Negative.    Respiratory: Negative.    Cardiovascular: Negative.   Gastrointestinal:  Positive for abdominal pain and constipation. Negative for abdominal distention, diarrhea, nausea, rectal pain and vomiting.  Genitourinary:  Positive for dysuria. Negative for decreased urine volume, difficulty urinating, dyspareunia, enuresis, flank pain, frequency, genital sores, hematuria, menstrual problem, pelvic pain, urgency, vaginal  bleeding, vaginal discharge and vaginal pain.  Musculoskeletal: Negative.   Skin: Negative.   Neurological: Negative.   All other systems reviewed and are negative.  Physical Exam Updated Vital Signs BP 112/73    Pulse 70    Temp 98 F (36.7 C) (Oral)    Resp 20    LMP 06/05/2018    SpO2 99%  Physical Exam Vitals and nursing note reviewed.  Constitutional:      General: She is not in acute distress.    Appearance: She is well-developed. She is not ill-appearing, toxic-appearing or diaphoretic.  HENT:     Head: Atraumatic.  Eyes:     Pupils: Pupils are equal, round, and reactive to light.  Cardiovascular:     Rate and Rhythm: Normal rate.  Pulmonary:     Effort: No respiratory distress.  Abdominal:     General: Bowel sounds are normal. There is no distension.     Palpations: Abdomen is soft.     Tenderness: There is generalized abdominal tenderness and tenderness in the right lower quadrant, suprapubic area and left lower quadrant. There is no right CVA tenderness, left CVA tenderness, guarding or rebound. Negative signs include Murphy's sign and McBurney's sign.     Comments: Generalized tenderness, worse to lower abdomen  Musculoskeletal:        General: Normal range of motion.     Cervical back: Normal range of motion.     Comments: LE ankles cuffed by [employer]  Skin:    General: Skin is warm and dry.  Neurological:     General: No focal deficit present.     Mental Status: She is alert.  Psychiatric:        Mood and Affect: Mood normal.    ED Results / Procedures / Treatments   Labs (all labs ordered are listed, but only abnormal results are displayed) Labs Reviewed  COMPREHENSIVE METABOLIC PANEL - Abnormal; Notable for the following components:      Result Value   Calcium 8.5 (*)    All other components within normal limits  URINALYSIS, ROUTINE W REFLEX MICROSCOPIC - Abnormal; Notable for the following components:   APPearance HAZY (*)    All other components within  normal limits  CBC WITH DIFFERENTIAL/PLATELET  LIPASE, BLOOD    EKG None  Radiology CT Abdomen Pelvis W Contrast  Result Date: 10/03/2021 CLINICAL DATA:  LLQ abdominal pain abd pain, constipation, hx of SBO EXAM: CT ABDOMEN AND PELVIS WITH CONTRAST TECHNIQUE: Multidetector CT imaging of the abdomen and pelvis was performed using the standard protocol following bolus administration of intravenous contrast. RADIATION DOSE REDUCTION: This exam was performed according to the departmental dose-optimization program which includes automated exposure control, adjustment of the mA and/or kV according to patient size and/or use of iterative reconstruction technique. CONTRAST:  OMNIPAQUE IOHEXOL 300 MG/ML  SOLN COMPARISON:  January 01, 2021. FINDINGS: Lower chest: No acute abnormality. Hepatobiliary: Focal fatty deposition adjacent to the falciform ligament. Gallbladder is unremarkable. Portal vein is patent. Pancreas: There  is dilation of the pancreatic duct to proximally 6 mm. This is mildly increased in comparison to prior. No discrete mass is visualized. Spleen: Unremarkable. Adrenals/Urinary Tract: Adrenal glands are unremarkable. There is mild RIGHT-sided hydroureteronephrosis secondary to a 4 mm ureterolithiasis at the RIGHT UVJ. LEFT kidney is unremarkable. Bladder is unremarkable. Stomach/Bowel: Stomach is within normal limits. Appendix appears normal. No evidence of bowel wall thickening, distention, or inflammatory changes. Moderate colonic stool burden. Vascular/Lymphatic: No significant vascular findings are present. No enlarged abdominal or pelvic lymph nodes. Reproductive: Status post hysterectomy. No adnexal masses. Other: No free air. Musculoskeletal: No acute or significant osseous findings. IMPRESSION: 1. There is mild RIGHT-sided hydroureteronephrosis secondary to a 4 mm ureterolithiasis at the RIGHT UVJ. 2. Mildly increased dilation of the pancreatic duct to 6 mm. This appears increased since  prior. Recommend nonemergent evaluation with dedicated liver protocol MRI with contrast with MRCP. Electronically Signed   By: Meda KlinefelterStephanie  Peacock M.D.   On: 10/03/2021 18:25    Procedures Procedures    Medications Ordered in ED Medications  sodium chloride 0.9 % bolus 1,000 mL (0 mLs Intravenous Stopped 10/03/21 1816)  ondansetron (ZOFRAN) injection 4 mg (4 mg Intravenous Given 10/03/21 1653)  morphine (PF) 4 MG/ML injection 4 mg (4 mg Intravenous Given 10/03/21 1653)  iohexol (OMNIPAQUE) 300 MG/ML solution 100 mL (100 mLs Intravenous Contrast Given 10/03/21 1758)  sorbitol, milk of mag, mineral oil, glycerin (SMOG) enema (960 mLs Rectal Given 10/03/21 2018)  lidocaine (XYLOCAINE) 2 % jelly 1 application (1 application Topical Given 10/03/21 2000)  ketorolac (TORADOL) 30 MG/ML injection 30 mg (30 mg Intravenous Given 10/03/21 2053)  HYDROmorphone (DILAUDID) injection 0.5 mg (0.5 mg Intravenous Given 10/03/21 2053)  ondansetron (ZOFRAN) injection 4 mg (4 mg Intravenous Given 10/03/21 2053)    ED Course/ Medical Decision Making/ A&P    46 year old here for evaluation of abdominal pain and constipation.  Episode of emesis yesterday.  Her abdomen is diffusely tender however worse to her lower abdomen.  Has had some dysuria.  Was started on Macrobid while incarcerated for possible UTI.  No back pain, vaginal bleeding, concerns for STDs.  Plan on labs, imaging and reassess  Labs and imaging personally viewed and interpreted:  CT AP with Right UVJ stone with hydronephrosis, moderate stool burden, no SBO, mild dilation and pancreatic duct, has had on prior imaging, will have her follow-up outpatient for MR pancreas protocol per radiology, she has normal LFTs, no upper abdominal pain low suspicion for choledocholithiasis, cholangitis UA negative for infection Lipase 24 Metabolic panel without significant abnormality CBC without significant abnormality   Reassess after enema.  She had significant output after  enema however has some persistent lower abdominal cramping on the right which I suspect is due to her UVJ stone.  We will give some additional pain medication, antiemetic, Toradol for her kidney stone.  Patient reassessed. Pain improved, tolerating p.o. intake.  She has had 2 large bowel movements here in the emergency department that any melena blood per rectum.  We will treat her pain from her kidney stone however I did discuss with patient that opiate medications used to treat her kidney stone can make her constipation worse.  Discussed glycerin suppositories as well as MiraLAX and increasing fluid intake for constipation.  Does have hemorrhoid on exam.  Anusol Rx given.  The patient has been appropriately medically screened and/or stabilized in the ED. I have low suspicion for any other emergent medical condition which would require further screening, evaluation  or treatment in the ED or require inpatient management.  Patient is hemodynamically stable and in no acute distress.  Patient able to ambulate in department prior to ED.  Evaluation does not show acute pathology that would require ongoing or additional emergent interventions while in the emergency department or further inpatient treatment.  I have discussed the diagnosis with the patient and answered all questions.  Pain is been managed while in the emergency department and patient has no further complaints prior to discharge.  Patient is comfortable with plan discussed in room and is stable for discharge at this time.  I have discussed strict return precautions for returning to the emergency department.  Patient was encouraged to follow-up with PCP/specialist refer to at discharge.                           Medical Decision Making Amount and/or Complexity of Data Reviewed Labs: ordered. Radiology: ordered.  Risk OTC drugs. Prescription drug management.         Final Clinical Impression(s) / ED Diagnoses Final diagnoses:   Constipation, unspecified constipation type  Right ureteral stone    Rx / DC Orders ED Discharge Orders          Ordered    oxyCODONE (ROXICODONE) 5 MG immediate release tablet  Every 4 hours PRN        10/03/21 2140    ondansetron (ZOFRAN-ODT) 4 MG disintegrating tablet  Every 8 hours PRN        10/03/21 2140    tamsulosin (FLOMAX) 0.4 MG CAPS capsule  Daily        10/03/21 2140    glycerin adult 2 g suppository  As needed        10/03/21 2140    polyethylene glycol powder (MIRALAX) 17 GM/SCOOP powder   Once        10/03/21 2140    hydrocortisone (ANUSOL-HC) 2.5 % rectal cream  2 times daily        10/03/21 2140              Jakobie Henslee A, PA-C 10/03/21 2311    Terald Sleeper, MD 10/05/21 [phone]

## 2021-10-03 NOTE — Discharge Instructions (Addendum)
You have a kidney stone which is likely the cause of your pain.  I have written for a short course of some pain medicine as well as some nausea medicine.  Take as prescribed.  Also written for Flomax which is a medication to dilate your ureters to help you pass the stone.  Unfortunately the pain medication that you are taking for your kidney stone may worsen your constipation so use only as needed.  You may develop some burning with urination, blood in urine and some difficulty urinating due to your kidney stone ? ?I have written you for a few medications to help with your hemorrhoid as well as your constipation.  Take as prescribed. ? ?Return for new or worsening symptoms ?

## 2021-10-04 ENCOUNTER — Telehealth: Payer: Self-pay

## 2021-10-04 NOTE — Telephone Encounter (Signed)
Transition Care Management Unsuccessful Follow-up Telephone Call ? ?Date of discharge and from where:  10/03/2021-[facility]  ? ?Attempts:  1st Attempt ? ?Reason for unsuccessful TCM follow-up call:  Unable to leave message ? ?  ?

## 2021-10-05 NOTE — Telephone Encounter (Signed)
Transition Care Management Unsuccessful Follow-up Telephone Call ? ?Date of discharge and from where:  10/03/2021-[place]  ? ?Attempts:  2nd Attempt ? ?Reason for unsuccessful TCM follow-up call:  Unable to reach patient ? ?Custody of [facility] ? ?  ?

## 2021-10-06 NOTE — Telephone Encounter (Signed)
Transition Care Management Unsuccessful Follow-up Telephone Call ? ?Date of discharge and from where:  10/03/2021 from [facility] ? ?Attempts:  3rd Attempt ? ?Reason for unsuccessful TCM follow-up call:  Unable to reach patient ? ? ? ?

## 2021-10-28 ENCOUNTER — Encounter (HOSPITAL_COMMUNITY): Payer: Self-pay | Admitting: Radiology

## 2021-12-21 DIAGNOSIS — F3289 Other specified depressive episodes: Secondary | ICD-10-CM | POA: Diagnosis not present

## 2022-05-09 ENCOUNTER — Emergency Department (HOSPITAL_COMMUNITY)
Admission: EM | Admit: 2022-05-09 | Discharge: 2022-05-09 | Disposition: A | Discharge status: Home / Self Care | Payer: Commercial Managed Care - HMO | Attending: Emergency Medicine | Admitting: Emergency Medicine

## 2022-05-09 ENCOUNTER — Emergency Department (HOSPITAL_COMMUNITY): Payer: Commercial Managed Care - HMO

## 2022-05-09 ENCOUNTER — Encounter (HOSPITAL_COMMUNITY): Payer: Self-pay | Admitting: Emergency Medicine

## 2022-05-09 DIAGNOSIS — J45909 Unspecified asthma, uncomplicated: Secondary | ICD-10-CM | POA: Diagnosis not present

## 2022-05-09 DIAGNOSIS — Z7982 Long term (current) use of aspirin: Secondary | ICD-10-CM | POA: Diagnosis not present

## 2022-05-09 DIAGNOSIS — L02811 Cutaneous abscess of head [any part, except face]: Secondary | ICD-10-CM | POA: Diagnosis not present

## 2022-05-09 DIAGNOSIS — R220 Localized swelling, mass and lump, head: Secondary | ICD-10-CM | POA: Diagnosis present

## 2022-05-09 DIAGNOSIS — L03811 Cellulitis of head [any part, except face]: Secondary | ICD-10-CM

## 2022-05-09 LAB — CBC
HCT: 41.6 % (ref 36.0–46.0)
Hemoglobin: 13.8 g/dL (ref 12.0–15.0)
MCH: 30.8 pg (ref 26.0–34.0)
MCHC: 33.2 g/dL (ref 30.0–36.0)
MCV: 92.9 fL (ref 80.0–100.0)
Platelets: 253 10*3/uL (ref 150–400)
RBC: 4.48 MIL/uL (ref 3.87–5.11)
RDW: 13.2 % (ref 11.5–15.5)
WBC: 8 10*3/uL (ref 4.0–10.5)
nRBC: 0 % (ref 0.0–0.2)

## 2022-05-09 LAB — BASIC METABOLIC PANEL
Anion gap: 9 (ref 5–15)
BUN: 9 mg/dL (ref 6–20)
CO2: 24 mmol/L (ref 22–32)
Calcium: 8.9 mg/dL (ref 8.9–10.3)
Chloride: 106 mmol/L (ref 98–111)
Creatinine, Ser: 0.76 mg/dL (ref 0.44–1.00)
GFR, Estimated: >60 mL/min (ref >60)
Glucose, Bld: 100 mg/dL — ABNORMAL HIGH (ref 70–99)
Potassium: 4 mmol/L (ref 3.5–5.1)
Sodium: 139 mmol/L (ref 135–145)

## 2022-05-09 MED ORDER — CLINDAMYCIN HCL 300 MG PO CAPS: 300.0000 mg | ORAL_CAPSULE | Freq: Four times a day (QID) | ORAL | 0 refills | Status: DC

## 2022-05-09 MED ORDER — CLINDAMYCIN HCL 300 MG PO CAPS: 300.0000 mg | ORAL_CAPSULE | Freq: Four times a day (QID) | ORAL | 0 refills | Status: AC

## 2022-05-09 NOTE — ED Triage Notes (Signed)
Patient here with complaint of headache and blurred vision that were present on waking at approximately 0745 this morning, reports having a headache last night when she went to sleep at 1230, unsure of what time headache started. Patient is alert, oriented, and tearful.

## 2022-05-09 NOTE — ED Provider Notes (Cosign Needed Addendum)
[facility] EMERGENCY DEPARTMENT Provider Note   CSN: [id number] Arrival date & time: 05/09/22  0803     History  Chief Complaint  Patient presents with   Headache    Tamara Bradford is a 46 y.o. female.  Patient with hx of asthma presents with left forehead lesion and swelling.  She noticed it about 2 to 3 days ago when she was removing her wig that was glued on.  She tried to squeeze the lesion with some drainage noted. No active bleeding. Developed headache localized to left forehead overnight. Has some mild left sided swelling of forehead. She has tried tylenol and ibuprofen with some relief. Denies fever, chills, n/v or shortness of breath.   The history is provided by the patient.  Headache Associated symptoms: no congestion, no cough, no fever, no nausea and no vomiting        Home Medications Prior to Admission medications   Medication Sig Start Date End Date Taking? Authorizing Provider  acetaminophen (TYLENOL) 325 MG tablet Take 650 mg by mouth every 6 (six) hours as needed for mild pain. 02/26/19   [provider]  aspirin EC 81 MG tablet Take 1 tablet (81 mg total) by mouth daily. Take after 12 weeks for prevention of preeclampsia later in pregnancy 10/22/18   Anyanwu, Sallyanne Havers, MD  benzonatate (TESSALON) 100 MG capsule Take 1 capsule (100 mg total) by mouth every 8 (eight) hours. 09/19/21   Smoot, Leary Roca, PA-C  budesonide-formoterol (SYMBICORT) 160-4.5 MCG/ACT inhaler Inhale into the lungs. 02/26/19   [provider]  Ciclopirox 0.77 % gel Apply 1 application topically 2 (two) times daily. Apply between toes 08/10/21   Trula Slade, DPM  clindamycin (CLEOCIN) 300 MG capsule Take 1 capsule (300 mg total) by mouth 4 (four) times daily for 7 days. 05/09/22 05/16/22  Angelique Blonder, DO  fluconazole (DIFLUCAN) 150 MG tablet Take 1 tablet (150 mg total) by mouth once a week. 08/10/21   Trula Slade, DPM  glycerin adult 2 g  suppository Place 1 suppository rectally as needed for constipation. 10/03/21   Henderly, Britni A, PA-C  hydrocortisone (ANUSOL-HC) 2.5 % rectal cream Place 1 application rectally 2 (two) times daily. 10/03/21   Henderly, Britni A, PA-C  loratadine (CLARITIN) 10 MG tablet Take 10 mg by mouth daily as needed for allergies.    [provider]  ondansetron (ZOFRAN-ODT) 4 MG disintegrating tablet Take 1 tablet (4 mg total) by mouth every 8 (eight) hours as needed for nausea or vomiting. 10/03/21   Henderly, Britni A, PA-C  oxyCODONE (ROXICODONE) 5 MG immediate release tablet Take 1 tablet (5 mg total) by mouth every 4 (four) hours as needed for severe pain. 10/03/21   Henderly, Britni A, PA-C  PROAIR HFA 108 (90 Base) MCG/ACT inhaler Inhale 1 puff into the lungs every 4 (four) hours as needed for wheezing or shortness of breath. 12/20/20   [provider]  triamcinolone (KENALOG) 0.025 % ointment Apply 1 application topically 2 (two) times daily. 08/10/21   Trula Slade, DPM      Allergies    Cinnamon, Keflex [cephalexin], and Naproxen    Review of Systems   Review of Systems  Constitutional:  Negative for chills and fever.  HENT:  Negative for congestion and rhinorrhea.   Respiratory:  Negative for cough and shortness of breath.   Gastrointestinal:  Negative for nausea and vomiting.  Skin:  Positive for wound.  Left scalp lesion, forehead swelling  Neurological:  Positive for headaches.    Physical Exam Updated Vital Signs BP 111/77 (BP Location: Right Arm)   Pulse 88   Temp 97.9 F (36.6 C) (Oral)   Resp 16   Ht 5\' 7"  (1.702 m)   Wt 64 kg   LMP 06/05/2018   SpO2 98%   BMI 22.08 kg/m  Physical Exam Constitutional:      General: She is not in acute distress.    Appearance: She is not ill-appearing.  HENT:     Head: Atraumatic.     Comments: Left sided frontal scalp abscess with no drainage. Mild swelling of left forehead.  Cardiovascular:     Rate and  Rhythm: Normal rate and regular rhythm.  Pulmonary:     Effort: Pulmonary effort is normal.  Skin:    General: Skin is warm and dry.     Findings: Abscess (left frontal scalp) present.  Neurological:     Mental Status: She is alert.     ED Results / Procedures / Treatments   Labs (all labs ordered are listed, but only abnormal results are displayed) Labs Reviewed  BASIC METABOLIC PANEL - Abnormal; Notable for the following components:      Result Value   Glucose, Bld 100 (*)    All other components within normal limits  CBC  CBG MONITORING, ED    EKG None  Radiology CT Head Wo Contrast  Result Date: 05/09/2022 CLINICAL DATA:  Sudden onset severe headache and blurred vision this morning. EXAM: CT HEAD WITHOUT CONTRAST TECHNIQUE: Contiguous axial images were obtained from the base of the skull through the vertex without intravenous contrast. RADIATION DOSE REDUCTION: This exam was performed according to the departmental dose-optimization program which includes automated exposure control, adjustment of the mA and/or kV according to patient size and/or use of iterative reconstruction technique. COMPARISON:  06/08/2018 FINDINGS: Brain: Ventricles, cisterns and other CSF spaces are normal. There is no mass, mass effect, shift of midline structures or acute hemorrhage. No evidence of acute infarction. Vascular: No hyperdense vessel or unexpected calcification. Skull: Normal. Negative for fracture or focal lesion. Sinuses/Orbits: No acute finding. Other: Soft tissue swelling over the left frontal scalp. IMPRESSION: 1. No acute intracranial findings. 2. Soft tissue swelling over the left frontal scalp. Electronically Signed   By: Marin Olp M.D.   On: 05/09/2022 09:38    Procedures Procedures   Medications Ordered in ED Medications - No data to display  ED Course/ Medical Decision Making/ A&P                           Medical Decision Making Amount and/or Complexity of Data  Reviewed Labs: ordered.  Risk Prescription drug management.  Patient presents with scalp abscess and left forehead swelling. Differential diagnosis included but not limited to folliculitis, cellulitis, or contact dermatitis. CBC and BMP unremarkable. CT head showed only soft tissue swelling of left frontal scalp. Patient is afebrile with hemodynamically stable vitals. She is stable for discharge with oral antibiotics. Will send prescription for clindamycin for 7 days. Return precautions given.  Final Clinical Impression(s) / ED Diagnoses Final diagnoses:  Abscess or cellulitis of scalp    Rx / DC Orders ED Discharge Orders          Ordered    clindamycin (CLEOCIN) 300 MG capsule  4 times daily,   Status:  Discontinued  05/09/22 1409    clindamycin (CLEOCIN) 300 MG capsule  4 times daily        05/09/22 [place], Jerrine Urschel, DO 05/09/22 1500    Angelique Blonder, DO 05/09/22 1511    Lacretia Leigh, MD 05/10/22 [phone]

## 2022-05-09 NOTE — ED Provider Notes (Signed)
I saw and evaluated the patient, reviewed the resident's note and I agree with the findings and plan.   46 year old female presents with left frontal swelling as well as possible infection in her scalp.  Patient had a weight on and she removed it and had patient afterwards.  On exam patient has evidence of cellulitis.  Placed on antibiotics and discharge.   Lacretia Leigh, MD 05/09/22 1355

## 2022-05-09 NOTE — Discharge Instructions (Addendum)
You were seen for your left scalp lesion and forehead swelling. We will send you on oral antibiotics. Please take Clindamycin 300 mg four times daily for 7 days. I have provided information for [facility]. Please return if symptoms worsen or you develop a fever.

## 2022-05-21 ENCOUNTER — Other Ambulatory Visit: Payer: Self-pay | Admitting: Student

## 2022-09-21 ENCOUNTER — Ambulatory Visit: Admitting: Family

## 2022-10-27 ENCOUNTER — Emergency Department (HOSPITAL_COMMUNITY)
Admission: EM | Admit: 2022-10-27 | Discharge: 2022-10-27 | Disposition: A | Discharge status: Home / Self Care | Payer: Self-pay | Attending: Emergency Medicine | Admitting: Emergency Medicine

## 2022-10-27 ENCOUNTER — Other Ambulatory Visit: Payer: Self-pay

## 2022-10-27 ENCOUNTER — Encounter (HOSPITAL_COMMUNITY): Payer: Self-pay | Admitting: *Deleted

## 2022-10-27 ENCOUNTER — Emergency Department (HOSPITAL_COMMUNITY): Payer: Self-pay

## 2022-10-27 DIAGNOSIS — Z7982 Long term (current) use of aspirin: Secondary | ICD-10-CM | POA: Insufficient documentation

## 2022-10-27 DIAGNOSIS — N3000 Acute cystitis without hematuria: Secondary | ICD-10-CM | POA: Insufficient documentation

## 2022-10-27 DIAGNOSIS — R21 Rash and other nonspecific skin eruption: Secondary | ICD-10-CM | POA: Insufficient documentation

## 2022-10-27 LAB — CBC
HCT: 39.7 % (ref 36.0–46.0)
Hemoglobin: 13 g/dL (ref 12.0–15.0)
MCH: 30.1 pg (ref 26.0–34.0)
MCHC: 32.7 g/dL (ref 30.0–36.0)
MCV: 91.9 fL (ref 80.0–100.0)
Platelets: 235 10*3/uL (ref 150–400)
RBC: 4.32 MIL/uL (ref 3.87–5.11)
RDW: 13.2 % (ref 11.5–15.5)
WBC: 6.4 10*3/uL (ref 4.0–10.5)
nRBC: 0 % (ref 0.0–0.2)

## 2022-10-27 LAB — COMPREHENSIVE METABOLIC PANEL
ALT: 17 U/L (ref 0–44)
AST: 22 U/L (ref 15–41)
Albumin: 3.4 g/dL — ABNORMAL LOW (ref 3.5–5.0)
Alkaline Phosphatase: 86 U/L (ref 38–126)
Anion gap: 10 (ref 5–15)
BUN: 17 mg/dL (ref 6–20)
CO2: 19 mmol/L — ABNORMAL LOW (ref 22–32)
Calcium: 8.6 mg/dL — ABNORMAL LOW (ref 8.9–10.3)
Chloride: 105 mmol/L (ref 98–111)
Creatinine, Ser: 0.88 mg/dL (ref 0.44–1.00)
GFR, Estimated: >60 mL/min (ref >60)
Glucose, Bld: 174 mg/dL — ABNORMAL HIGH (ref 70–99)
Potassium: 3.4 mmol/L — ABNORMAL LOW (ref 3.5–5.1)
Sodium: 134 mmol/L — ABNORMAL LOW (ref 135–145)
Total Bilirubin: 0.6 mg/dL (ref 0.3–1.2)
Total Protein: 6.7 g/dL (ref 6.5–8.1)

## 2022-10-27 LAB — WET PREP, GENITAL
Clue Cells Wet Prep HPF POC: NONE SEEN
Sperm: NONE SEEN
Trich, Wet Prep: NONE SEEN
WBC, Wet Prep HPF POC: <10 (ref <10)
Yeast Wet Prep HPF POC: NONE SEEN

## 2022-10-27 LAB — URINALYSIS, ROUTINE W REFLEX MICROSCOPIC
Bilirubin Urine: NEGATIVE
Glucose, UA: NEGATIVE mg/dL
Ketones, ur: NEGATIVE mg/dL
Nitrite: POSITIVE — AB
Protein, ur: NEGATIVE mg/dL
Specific Gravity, Urine: 1.02 (ref 1.005–1.030)
WBC, UA: >50 WBC/hpf (ref 0–5)
pH: 5 (ref 5.0–8.0)

## 2022-10-27 LAB — I-STAT BETA HCG BLOOD, ED (MC, WL, AP ONLY): I-stat hCG, quantitative: <5 m[IU]/mL (ref <5)

## 2022-10-27 LAB — RPR: RPR Ser Ql: NONREACTIVE

## 2022-10-27 LAB — LIPASE, BLOOD: Lipase: 24 U/L (ref 11–51)

## 2022-10-27 LAB — GC/CHLAMYDIA PROBE AMP (~~LOC~~) NOT AT ARMC
Chlamydia: NEGATIVE
Comment: NEGATIVE
Comment: NORMAL
Neisseria Gonorrhea: NEGATIVE

## 2022-10-27 LAB — HIV ANTIBODY (ROUTINE TESTING W REFLEX): HIV Screen 4th Generation wRfx: NONREACTIVE

## 2022-10-27 MED ORDER — FENTANYL CITRATE PF 50 MCG/ML IJ SOSY
50.0000 ug | PREFILLED_SYRINGE | Freq: Once | INTRAMUSCULAR | Status: AC
  Administered 2022-10-27: 50 ug via INTRAVENOUS
  Filled 2022-10-27: qty 1

## 2022-10-27 MED ORDER — LEVOFLOXACIN 500 MG PO TABS: 500.0000 mg | ORAL_TABLET | Freq: Every day | ORAL | 0 refills | Status: DC

## 2022-10-27 MED ORDER — LACTATED RINGERS IV BOLUS
1000.0000 mL | Freq: Once | INTRAVENOUS | Status: AC
  Administered 2022-10-27: 1000 mL via INTRAVENOUS

## 2022-10-27 MED ORDER — IOHEXOL 350 MG/ML SOLN
75.0000 mL | Freq: Once | INTRAVENOUS | Status: AC | PRN
  Administered 2022-10-27: 75 mL via INTRAVENOUS

## 2022-10-27 MED ORDER — LEVOFLOXACIN IN D5W 750 MG/150ML IV SOLN
750.0000 mg | Freq: Once | INTRAVENOUS | Status: AC
  Administered 2022-10-27: 750 mg via INTRAVENOUS
  Filled 2022-10-27: qty 150

## 2022-10-27 NOTE — ED Notes (Signed)
Chareoned Dr Dayna Barker with pelvic exam.

## 2022-10-27 NOTE — ED Notes (Signed)
Pt to CT

## 2022-10-27 NOTE — ED Triage Notes (Signed)
The pt has not had a bm in 2 weeks  bad odor of urine   nausea and vomiting today  lmp none

## 2022-10-27 NOTE — ED Notes (Signed)
Pt back from CT

## 2022-10-27 NOTE — ED Provider Notes (Signed)
[facility] Provider Note   CSN: [id number] Arrival date & time: 10/27/22  0124     History  Chief Complaint  Patient presents with   Abdominal Pain    Tamara Bradford is a 47 y.o. female.  47 year old female who presents the ER today secondary to multiple symptoms.  Patient states that she has had malodorous discharge for a week or 2, malodorous and dark urine, abdominal discomfort and rash on her lower back and hands and then around her vaginal area for a week or 2 as well.  She has unprotected intercourse with her husband.  She has no other partners.  She is never an STD as she knows of.  She does have history of bowel obstructions does not feel like this is exactly like that but somewhat similar.  She had decreased bowel movements for last 1 to 2 weeks as well.  1 episode of nonbloody nonbilious emesis prior to coming here.  No fevers.   Abdominal Pain      Home Medications Prior to Admission medications   Medication Sig Start Date End Date Taking? Authorizing Provider  levofloxacin (LEVAQUIN) 500 MG tablet Take 1 tablet (500 mg total) by mouth daily. 10/27/22  Yes Narayan Scull, Corene Cornea, MD  acetaminophen (TYLENOL) 325 MG tablet Take 650 mg by mouth every 6 (six) hours as needed for mild pain. 02/26/19   [provider]  aspirin EC 81 MG tablet Take 1 tablet (81 mg total) by mouth daily. Take after 12 weeks for prevention of preeclampsia later in pregnancy 10/22/18   Anyanwu, Sallyanne Havers, MD  benzonatate (TESSALON) 100 MG capsule Take 1 capsule (100 mg total) by mouth every 8 (eight) hours. 09/19/21   Smoot, Leary Roca, PA-C  budesonide-formoterol (SYMBICORT) 160-4.5 MCG/ACT inhaler Inhale into the lungs. 02/26/19   [provider]  Ciclopirox 0.77 % gel Apply 1 application topically 2 (two) times daily. Apply between toes 08/10/21   Trula Slade, DPM  fluconazole (DIFLUCAN) 150 MG tablet Take 1 tablet (150 mg total) by mouth  once a week. 08/10/21   Trula Slade, DPM  glycerin adult 2 g suppository Place 1 suppository rectally as needed for constipation. 10/03/21   Henderly, Britni A, PA-C  hydrocortisone (ANUSOL-HC) 2.5 % rectal cream Place 1 application rectally 2 (two) times daily. 10/03/21   Henderly, Britni A, PA-C  loratadine (CLARITIN) 10 MG tablet Take 10 mg by mouth daily as needed for allergies.    [provider]  ondansetron (ZOFRAN-ODT) 4 MG disintegrating tablet Take 1 tablet (4 mg total) by mouth every 8 (eight) hours as needed for nausea or vomiting. 10/03/21   Henderly, Britni A, PA-C  oxyCODONE (ROXICODONE) 5 MG immediate release tablet Take 1 tablet (5 mg total) by mouth every 4 (four) hours as needed for severe pain. 10/03/21   Henderly, Britni A, PA-C  PROAIR HFA 108 (90 Base) MCG/ACT inhaler Inhale 1 puff into the lungs every 4 (four) hours as needed for wheezing or shortness of breath. 12/20/20   [provider]  triamcinolone (KENALOG) 0.025 % ointment Apply 1 application topically 2 (two) times daily. 08/10/21   Trula Slade, DPM      Allergies    Cinnamon, Keflex [cephalexin], and Naproxen    Review of Systems   Review of Systems  Gastrointestinal:  Positive for abdominal pain.    Physical Exam Updated Vital Signs BP (!) 152/106   Pulse 85   Temp  98.1 F (36.7 C) (Oral)   Resp 16   Ht 5\' 7"  (1.702 m)   Wt 64 kg   LMP 06/05/2018   SpO2 100%   BMI 22.10 kg/m  Physical Exam Vitals and nursing note reviewed.  Constitutional:      Appearance: She is well-developed.  HENT:     Head: Normocephalic and atraumatic.  Cardiovascular:     Rate and Rhythm: Normal rate and regular rhythm.  Pulmonary:     Effort: No respiratory distress.     Breath sounds: No stridor.  Abdominal:     General: There is no distension.  Genitourinary:    Comments: Chaperoned by nurse Gregary Signs, small patch of eczematous appearing rash to left inner thigh. No other concerning rashes.  Small amount of physiologic discharge, otherwise cervix appears normal.   Musculoskeletal:     Cervical back: Normal range of motion.  Skin:    Findings: Rash (isolated papules to lower back, non tender, nonfluctuant, nonerythematous) present.  Neurological:     Mental Status: She is alert.     ED Results / Procedures / Treatments   Labs (all labs ordered are listed, but only abnormal results are displayed) Labs Reviewed  COMPREHENSIVE METABOLIC PANEL - Abnormal; Notable for the following components:      Result Value   Sodium 134 (*)    Potassium 3.4 (*)    CO2 19 (*)    Glucose, Bld 174 (*)    Calcium 8.6 (*)    Albumin 3.4 (*)    All other components within normal limits  URINALYSIS, ROUTINE W REFLEX MICROSCOPIC - Abnormal; Notable for the following components:   APPearance CLOUDY (*)    Hgb urine dipstick MODERATE (*)    Nitrite POSITIVE (*)    Leukocytes,Ua LARGE (*)    Bacteria, UA MANY (*)    All other components within normal limits  WET PREP, GENITAL  LIPASE, BLOOD  CBC  HIV ANTIBODY (ROUTINE TESTING W REFLEX)  RPR  I-STAT BETA HCG BLOOD, ED ([facility], [facility], [facility] ONLY)  GC/CHLAMYDIA PROBE AMP ([place]) NOT AT [facility]    EKG None  Radiology CT ABDOMEN PELVIS W CONTRAST  Result Date: 10/27/2022 CLINICAL DATA:  Acute abdominal pain and no bowel movement for 2 weeks, initial encounter EXAM: CT ABDOMEN AND PELVIS WITH CONTRAST TECHNIQUE: Multidetector CT imaging of the abdomen and pelvis was performed using the standard protocol following bolus administration of intravenous contrast. RADIATION DOSE REDUCTION: This exam was performed according to the departmental dose-optimization program which includes automated exposure control, adjustment of the mA and/or kV according to patient size and/or use of iterative reconstruction technique. CONTRAST:  50mL OMNIPAQUE IOHEXOL 350 MG/ML SOLN COMPARISON:  10/03/2021 FINDINGS: Lower chest: No acute abnormality. Hepatobiliary: No focal  liver abnormality is seen. No gallstones, gallbladder wall thickening, or biliary dilatation. Pancreas: Unremarkable. No pancreatic ductal dilatation or surrounding inflammatory changes. Spleen: Normal in size without focal abnormality. Adrenals/Urinary Tract: Adrenal glands are within normal limits. Kidneys are well visualized bilaterally. No renal calculi or obstructive changes are noted. The ureters are within normal limits. The bladder is decompressed. Stomach/Bowel: Scattered fecal material is noted throughout the colon without obstructive change. The appendix is within normal limits. Small bowel and stomach are within normal limits. Vascular/Lymphatic: No significant vascular findings are present. No enlarged abdominal or pelvic lymph nodes. Reproductive: Status post hysterectomy. No adnexal masses. Other: No abdominal wall hernia or abnormality. No abdominopelvic ascites. Musculoskeletal: No acute or significant osseous findings. IMPRESSION:  Mild retained fecal material without obstructive change consistent with mild constipation. No other focal abnormality is noted. Electronically Signed   By: Inez Catalina M.D.   On: 10/27/2022 03:34    Procedures Procedures    Medications Ordered in ED Medications  fentaNYL (SUBLIMAZE) injection 50 mcg (50 mcg Intravenous Given 10/27/22 0229)  lactated ringers bolus 1,000 mL (1,000 mLs Intravenous New Bag/Given 10/27/22 0228)  iohexol (OMNIPAQUE) 350 MG/ML injection 75 mL (75 mLs Intravenous Contrast Given 10/27/22 0327)  levofloxacin (LEVAQUIN) IVPB 750 mg (750 mg Intravenous New Bag/Given 10/27/22 0404)    ED Course/ Medical Decision Making/ A&P                             Medical Decision Making Amount and/or Complexity of Data Reviewed Labs: ordered. Radiology: ordered.  Risk Prescription drug management.   Urine appears to be infected, will need to do pelvic exam. Consider possible syphillis with the rash she has on her back and hands, will eval  for same, no h/o same.  Rest of workup reassuring. Treated for UTI. No pyelo. Wet prep clean.    Final Clinical Impression(s) / ED Diagnoses Final diagnoses:  Acute cystitis without hematuria    Rx / DC Orders ED Discharge Orders          Ordered    levofloxacin (LEVAQUIN) 500 MG tablet  Daily        10/27/22 0501              Elius Etheredge, Corene Cornea, MD 10/27/22 [phone]

## 2022-12-24 ENCOUNTER — Emergency Department (HOSPITAL_BASED_OUTPATIENT_CLINIC_OR_DEPARTMENT_OTHER)
Admission: EM | Admit: 2022-12-24 | Discharge: 2022-12-24 | Disposition: A | Discharge status: Home / Self Care | Attending: Emergency Medicine | Admitting: Emergency Medicine

## 2022-12-24 ENCOUNTER — Other Ambulatory Visit: Payer: Self-pay

## 2022-12-24 DIAGNOSIS — F17210 Nicotine dependence, cigarettes, uncomplicated: Secondary | ICD-10-CM | POA: Insufficient documentation

## 2022-12-24 DIAGNOSIS — W268XXA Contact with other sharp object(s), not elsewhere classified, initial encounter: Secondary | ICD-10-CM | POA: Insufficient documentation

## 2022-12-24 DIAGNOSIS — S91332A Puncture wound without foreign body, left foot, initial encounter: Secondary | ICD-10-CM | POA: Insufficient documentation

## 2022-12-24 DIAGNOSIS — Z7982 Long term (current) use of aspirin: Secondary | ICD-10-CM | POA: Insufficient documentation

## 2022-12-24 DIAGNOSIS — L089 Local infection of the skin and subcutaneous tissue, unspecified: Secondary | ICD-10-CM

## 2022-12-24 DIAGNOSIS — J45909 Unspecified asthma, uncomplicated: Secondary | ICD-10-CM | POA: Insufficient documentation

## 2022-12-24 MED ORDER — LIDOCAINE-EPINEPHRINE (PF) 2 %-1:200000 IJ SOLN
10.0000 mL | Freq: Once | INTRAMUSCULAR | Status: AC
  Administered 2022-12-24: 10 mL via INTRADERMAL
  Filled 2022-12-24: qty 20

## 2022-12-24 MED ORDER — LEVOFLOXACIN 500 MG PO TABS
500.0000 mg | ORAL_TABLET | Freq: Once | ORAL | Status: AC
  Administered 2022-12-24: 500 mg via ORAL
  Filled 2022-12-24: qty 1

## 2022-12-24 MED ORDER — LEVOFLOXACIN 500 MG PO TABS: ORAL_TABLET | ORAL | 0 refills | Status: DC
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
## 2022-12-24 NOTE — ED Triage Notes (Signed)
Pt. States she stepped stepped on " little brown balls with spikes that fall off trees" x 1 week ago . States has 1 in each foot, but the left foot hurts more.

## 2023-01-23 ENCOUNTER — Ambulatory Visit: Payer: Self-pay | Admitting: Podiatry

## 2023-02-07 ENCOUNTER — Ambulatory Visit: Payer: 59 | Admitting: Podiatry

## 2023-05-04 ENCOUNTER — Emergency Department (HOSPITAL_BASED_OUTPATIENT_CLINIC_OR_DEPARTMENT_OTHER)
Admission: EM | Admit: 2023-05-04 | Discharge: 2023-05-04 | Disposition: A | Discharge status: Home / Self Care | Payer: Medicaid Other | Attending: Emergency Medicine | Admitting: Emergency Medicine

## 2023-05-04 ENCOUNTER — Encounter (HOSPITAL_BASED_OUTPATIENT_CLINIC_OR_DEPARTMENT_OTHER): Payer: Self-pay | Admitting: Emergency Medicine

## 2023-05-04 ENCOUNTER — Other Ambulatory Visit: Payer: Self-pay

## 2023-05-04 DIAGNOSIS — J4521 Mild intermittent asthma with (acute) exacerbation: Secondary | ICD-10-CM | POA: Diagnosis not present

## 2023-05-04 DIAGNOSIS — F17200 Nicotine dependence, unspecified, uncomplicated: Secondary | ICD-10-CM | POA: Insufficient documentation

## 2023-05-04 DIAGNOSIS — R0602 Shortness of breath: Secondary | ICD-10-CM | POA: Diagnosis present

## 2023-05-04 DIAGNOSIS — Z7951 Long term (current) use of inhaled steroids: Secondary | ICD-10-CM | POA: Diagnosis not present

## 2023-05-04 DIAGNOSIS — Z7982 Long term (current) use of aspirin: Secondary | ICD-10-CM | POA: Diagnosis not present

## 2023-05-04 MED ORDER — IPRATROPIUM-ALBUTEROL 0.5-2.5 (3) MG/3ML IN SOLN: 3.0000 mL | Freq: Once | RESPIRATORY_TRACT | Status: AC

## 2023-05-04 MED ORDER — ALBUTEROL SULFATE HFA 108 (90 BASE) MCG/ACT IN AERS: 1.0000 | INHALATION_SPRAY | Freq: Four times a day (QID) | RESPIRATORY_TRACT | 0 refills | Status: AC | PRN

## 2023-05-04 MED ORDER — IPRATROPIUM-ALBUTEROL 0.5-2.5 (3) MG/3ML IN SOLN
RESPIRATORY_TRACT | Status: AC
  Administered 2023-05-04: 3 mL via RESPIRATORY_TRACT
  Filled 2023-05-04: qty 3

## 2023-05-04 MED ORDER — ALBUTEROL SULFATE HFA 108 (90 BASE) MCG/ACT IN AERS
4.0000 | INHALATION_SPRAY | Freq: Once | RESPIRATORY_TRACT | Status: AC
  Administered 2023-05-04: 4 via RESPIRATORY_TRACT
  Filled 2023-05-04: qty 6.7

## 2023-05-04 MED ORDER — DEXAMETHASONE SODIUM PHOSPHATE 10 MG/ML IJ SOLN
10.0000 mg | Freq: Once | INTRAMUSCULAR | Status: AC
  Administered 2023-05-04: 10 mg via INTRAMUSCULAR
  Filled 2023-05-04: qty 1

## 2023-05-04 NOTE — Discharge Instructions (Addendum)
You were seen today for shortness of breath.  This is likely related to your asthma.  Make sure that you are you are taking your inhaler.  You were given a dose of steroids in the emergency department.  Avoid triggers.

## 2023-05-04 NOTE — ED Triage Notes (Signed)
Pt with hx of asthma. Presents shob and wheezing at time of triage.  Took her last of albuterol this morning.  Went to [employer] and got Primatene mist and continued to worsen.  Orthopneic, tachypneic, and increased work of breathing noted at time of triage.  RT at bedside.  Pt states she went to get her Albuterol refilled but was out of refills.

## 2023-05-04 NOTE — ED Notes (Signed)
Patient states she is breathing better, and able to talk in complete sentences.
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
## 2023-05-29 ENCOUNTER — Other Ambulatory Visit: Payer: Self-pay

## 2023-05-29 ENCOUNTER — Emergency Department (HOSPITAL_COMMUNITY)
Admission: EM | Admit: 2023-05-29 | Discharge: 2023-05-29 | Discharge status: Against Medical Advice | Payer: 59 | Attending: Emergency Medicine | Admitting: Emergency Medicine

## 2023-05-29 DIAGNOSIS — S01411A Laceration without foreign body of right cheek and temporomandibular area, initial encounter: Secondary | ICD-10-CM | POA: Insufficient documentation

## 2023-05-29 DIAGNOSIS — R58 Hemorrhage, not elsewhere classified: Secondary | ICD-10-CM | POA: Diagnosis not present

## 2023-05-29 DIAGNOSIS — Z5321 Procedure and treatment not carried out due to patient leaving prior to being seen by health care provider: Secondary | ICD-10-CM | POA: Diagnosis not present

## 2023-05-29 DIAGNOSIS — W268XXA Contact with other sharp object(s), not elsewhere classified, initial encounter: Secondary | ICD-10-CM | POA: Diagnosis not present

## 2023-05-29 DIAGNOSIS — W19XXXA Unspecified fall, initial encounter: Secondary | ICD-10-CM | POA: Diagnosis not present

## 2023-05-29 DIAGNOSIS — S0993XA Unspecified injury of face, initial encounter: Secondary | ICD-10-CM | POA: Diagnosis not present

## 2023-05-29 NOTE — ED Notes (Signed)
Informed Medical staff of patient removing ECG Leads, gown on bed, patient leaving.

## 2023-05-29 NOTE — ED Notes (Signed)
Pt left room, present nurse notified risk of leaving AMA. MD notified

## 2023-05-29 NOTE — ED Triage Notes (Addendum)
Coming from home, pt was at storage unit, climbing boxes, felt and got a cut on her right cheek/temporal area, covered with dressing by ems, 1 inch diameter of the cut. . Denies loss consciousness. Pain 6 out of 10.  Aox3, however does not remember if she loss consciousness.

## 2023-05-29 NOTE — ED Notes (Signed)
Pt took off gown, removed leads and notified staff desire to leave. Staff educated about leaving AMA. Pt still on room.

## 2023-06-09 DIAGNOSIS — F1510 Other stimulant abuse, uncomplicated: Secondary | ICD-10-CM | POA: Diagnosis not present

## 2023-07-31 ENCOUNTER — Ambulatory Visit (INDEPENDENT_AMBULATORY_CARE_PROVIDER_SITE_OTHER): Payer: 59

## 2023-07-31 ENCOUNTER — Encounter: Payer: Self-pay | Admitting: Podiatry

## 2023-07-31 ENCOUNTER — Ambulatory Visit (INDEPENDENT_AMBULATORY_CARE_PROVIDER_SITE_OTHER): Payer: 59 | Admitting: Podiatry

## 2023-07-31 DIAGNOSIS — B353 Tinea pedis: Secondary | ICD-10-CM

## 2023-07-31 DIAGNOSIS — M778 Other enthesopathies, not elsewhere classified: Secondary | ICD-10-CM

## 2023-07-31 DIAGNOSIS — M7989 Other specified soft tissue disorders: Secondary | ICD-10-CM | POA: Diagnosis not present

## 2023-07-31 DIAGNOSIS — M60271 Foreign body granuloma of soft tissue, not elsewhere classified, right ankle and foot: Secondary | ICD-10-CM | POA: Diagnosis not present

## 2023-07-31 MED ORDER — CICLOPIROX 0.77 % EX GEL: 1.0000 "application " | Freq: Two times a day (BID) | CUTANEOUS | 0 refills | Status: DC

## 2023-07-31 MED ORDER — TRIAMCINOLONE ACETONIDE 0.025 % EX OINT: 1.0000 | TOPICAL_OINTMENT | Freq: Two times a day (BID) | CUTANEOUS | 0 refills | Status: DC

## 2023-07-31 MED ORDER — SULFAMETHOXAZOLE-TRIMETHOPRIM 800-160 MG PO TABS: 1.0000 | ORAL_TABLET | Freq: Two times a day (BID) | ORAL | 0 refills | Status: DC

## 2023-08-25 ENCOUNTER — Ambulatory Visit: Payer: 59 | Admitting: Podiatry

## 2023-09-06 ENCOUNTER — Encounter: Payer: 59 | Admitting: Obstetrics & Gynecology

## 2023-09-08 ENCOUNTER — Other Ambulatory Visit: Payer: Self-pay | Admitting: Podiatry

## 2023-09-11 MED ORDER — SULFAMETHOXAZOLE-TRIMETHOPRIM 800-160 MG PO TABS: 1.0000 | ORAL_TABLET | Freq: Two times a day (BID) | ORAL | 0 refills | Status: DC

## 2023-09-16 ENCOUNTER — Ambulatory Visit: Payer: Medicaid Other | Admitting: Podiatry

## 2023-11-26 ENCOUNTER — Encounter (HOSPITAL_COMMUNITY): Payer: Self-pay

## 2023-11-26 ENCOUNTER — Other Ambulatory Visit: Payer: Self-pay

## 2023-11-26 ENCOUNTER — Inpatient Hospital Stay (HOSPITAL_COMMUNITY)
Admission: EM | Admit: 2023-11-26 | Discharge: 2023-11-28 | DRG: 388 | Disposition: A | Discharge status: Home / Self Care | Payer: MEDICAID | Attending: Family Medicine | Admitting: Family Medicine

## 2023-11-26 ENCOUNTER — Emergency Department (HOSPITAL_COMMUNITY): Payer: MEDICAID

## 2023-11-26 DIAGNOSIS — E876 Hypokalemia: Secondary | ICD-10-CM | POA: Diagnosis present

## 2023-11-26 DIAGNOSIS — Z87442 Personal history of urinary calculi: Secondary | ICD-10-CM

## 2023-11-26 DIAGNOSIS — J4520 Mild intermittent asthma, uncomplicated: Secondary | ICD-10-CM | POA: Diagnosis present

## 2023-11-26 DIAGNOSIS — J45909 Unspecified asthma, uncomplicated: Secondary | ICD-10-CM | POA: Diagnosis present

## 2023-11-26 DIAGNOSIS — G928 Other toxic encephalopathy: Secondary | ICD-10-CM | POA: Diagnosis not present

## 2023-11-26 DIAGNOSIS — Z8659 Personal history of other mental and behavioral disorders: Secondary | ICD-10-CM

## 2023-11-26 DIAGNOSIS — F17210 Nicotine dependence, cigarettes, uncomplicated: Secondary | ICD-10-CM | POA: Diagnosis present

## 2023-11-26 DIAGNOSIS — I10 Essential (primary) hypertension: Secondary | ICD-10-CM | POA: Diagnosis present

## 2023-11-26 DIAGNOSIS — Z79899 Other long term (current) drug therapy: Secondary | ICD-10-CM | POA: Diagnosis not present

## 2023-11-26 DIAGNOSIS — Z7951 Long term (current) use of inhaled steroids: Secondary | ICD-10-CM

## 2023-11-26 DIAGNOSIS — Z91419 Personal history of unspecified adult abuse: Secondary | ICD-10-CM | POA: Diagnosis not present

## 2023-11-26 DIAGNOSIS — Z886 Allergy status to analgesic agent status: Secondary | ICD-10-CM

## 2023-11-26 DIAGNOSIS — Z881 Allergy status to other antibiotic agents status: Secondary | ICD-10-CM

## 2023-11-26 DIAGNOSIS — T402X5A Adverse effect of other opioids, initial encounter: Secondary | ICD-10-CM | POA: Diagnosis not present

## 2023-11-26 DIAGNOSIS — K56609 Unspecified intestinal obstruction, unspecified as to partial versus complete obstruction: Secondary | ICD-10-CM | POA: Diagnosis present

## 2023-11-26 DIAGNOSIS — Z7982 Long term (current) use of aspirin: Secondary | ICD-10-CM | POA: Diagnosis not present

## 2023-11-26 LAB — CBC WITH DIFFERENTIAL/PLATELET
Abs Immature Granulocytes: 0.04 10*3/uL (ref 0.00–0.07)
Basophils Absolute: 0 10*3/uL (ref 0.0–0.1)
Basophils Relative: 0 %
Eosinophils Absolute: 0.4 10*3/uL (ref 0.0–0.5)
Eosinophils Relative: 4 %
HCT: 44.2 % (ref 36.0–46.0)
Hemoglobin: 15 g/dL (ref 12.0–15.0)
Immature Granulocytes: 0 %
Lymphocytes Relative: 23 %
Lymphs Abs: 2.2 10*3/uL (ref 0.7–4.0)
MCH: 30 pg (ref 26.0–34.0)
MCHC: 33.9 g/dL (ref 30.0–36.0)
MCV: 88.4 fL (ref 80.0–100.0)
Monocytes Absolute: 0.9 10*3/uL (ref 0.1–1.0)
Monocytes Relative: 9 %
Neutro Abs: 6.3 10*3/uL (ref 1.7–7.7)
Neutrophils Relative %: 64 %
Platelets: 315 10*3/uL (ref 150–400)
RBC: 5 MIL/uL (ref 3.87–5.11)
RDW: 13.3 % (ref 11.5–15.5)
WBC: 9.8 10*3/uL (ref 4.0–10.5)
nRBC: 0 % (ref 0.0–0.2)

## 2023-11-26 LAB — I-STAT CHEM 8, ED
BUN: 12 mg/dL (ref 6–20)
Calcium, Ion: 1.13 mmol/L — ABNORMAL LOW (ref 1.15–1.40)
Chloride: 107 mmol/L (ref 98–111)
Creatinine, Ser: 0.9 mg/dL (ref 0.44–1.00)
Glucose, Bld: 115 mg/dL — ABNORMAL HIGH (ref 70–99)
HCT: 46 % (ref 36.0–46.0)
Hemoglobin: 15.6 g/dL — ABNORMAL HIGH (ref 12.0–15.0)
Potassium: 3.5 mmol/L (ref 3.5–5.1)
Sodium: 141 mmol/L (ref 135–145)
TCO2: 23 mmol/L (ref 22–32)

## 2023-11-26 LAB — COMPREHENSIVE METABOLIC PANEL WITH GFR
ALT: 20 U/L (ref 0–44)
AST: 20 U/L (ref 15–41)
Albumin: 3.6 g/dL (ref 3.5–5.0)
Alkaline Phosphatase: 114 U/L (ref 38–126)
Anion gap: 12 (ref 5–15)
BUN: 11 mg/dL (ref 6–20)
CO2: 23 mmol/L (ref 22–32)
Calcium: 9.4 mg/dL (ref 8.9–10.3)
Chloride: 104 mmol/L (ref 98–111)
Creatinine, Ser: 0.86 mg/dL (ref 0.44–1.00)
GFR, Estimated: >60 mL/min (ref >60)
Glucose, Bld: 114 mg/dL — ABNORMAL HIGH (ref 70–99)
Potassium: 3.6 mmol/L (ref 3.5–5.1)
Sodium: 139 mmol/L (ref 135–145)
Total Bilirubin: 0.6 mg/dL (ref 0.0–1.2)
Total Protein: 7.6 g/dL (ref 6.5–8.1)

## 2023-11-26 LAB — BASIC METABOLIC PANEL WITH GFR
Anion gap: 10 (ref 5–15)
BUN: 10 mg/dL (ref 6–20)
CO2: 23 mmol/L (ref 22–32)
Calcium: 8.3 mg/dL — ABNORMAL LOW (ref 8.9–10.3)
Chloride: 106 mmol/L (ref 98–111)
Creatinine, Ser: 0.88 mg/dL (ref 0.44–1.00)
GFR, Estimated: >60 mL/min (ref >60)
Glucose, Bld: 144 mg/dL — ABNORMAL HIGH (ref 70–99)
Potassium: 3.4 mmol/L — ABNORMAL LOW (ref 3.5–5.1)
Sodium: 139 mmol/L (ref 135–145)

## 2023-11-26 LAB — ETHANOL: Alcohol, Ethyl (B): <15 mg/dL (ref <15)

## 2023-11-26 LAB — URINALYSIS, ROUTINE W REFLEX MICROSCOPIC
Bacteria, UA: NONE SEEN
Bilirubin Urine: NEGATIVE
Glucose, UA: NEGATIVE mg/dL
Ketones, ur: 5 mg/dL — AB
Leukocytes,Ua: NEGATIVE
Nitrite: NEGATIVE
Protein, ur: NEGATIVE mg/dL
Specific Gravity, Urine: >1.046 — ABNORMAL HIGH (ref 1.005–1.030)
pH: 5 (ref 5.0–8.0)

## 2023-11-26 LAB — CBC
HCT: 41.9 % (ref 36.0–46.0)
Hemoglobin: 13.7 g/dL (ref 12.0–15.0)
MCH: 30.3 pg (ref 26.0–34.0)
MCHC: 32.7 g/dL (ref 30.0–36.0)
MCV: 92.7 fL (ref 80.0–100.0)
Platelets: 246 10*3/uL (ref 150–400)
RBC: 4.52 MIL/uL (ref 3.87–5.11)
RDW: 13.2 % (ref 11.5–15.5)
WBC: 9.4 10*3/uL (ref 4.0–10.5)
nRBC: 0 % (ref 0.0–0.2)

## 2023-11-26 LAB — APTT: aPTT: 30 s (ref 24–36)

## 2023-11-26 LAB — RAPID URINE DRUG SCREEN, HOSP PERFORMED
Amphetamines: POSITIVE — AB
Barbiturates: NOT DETECTED
Benzodiazepines: NOT DETECTED
Cocaine: NOT DETECTED
Opiates: POSITIVE — AB
Tetrahydrocannabinol: NOT DETECTED

## 2023-11-26 LAB — HCG, SERUM, QUALITATIVE: Preg, Serum: NEGATIVE

## 2023-11-26 LAB — LIPASE, BLOOD: Lipase: 18 U/L (ref 11–51)

## 2023-11-26 LAB — GLUCOSE, CAPILLARY: Glucose-Capillary: 101 mg/dL — ABNORMAL HIGH (ref 70–99)

## 2023-11-26 LAB — PROTIME-INR
INR: 1.1 (ref 0.8–1.2)
Prothrombin Time: 14 s (ref 11.4–15.2)

## 2023-11-26 LAB — HIV ANTIBODY (ROUTINE TESTING W REFLEX): HIV Screen 4th Generation wRfx: NONREACTIVE

## 2023-11-26 MED ORDER — HYDROMORPHONE HCL 1 MG/ML IJ SOLN
1.0000 mg | Freq: Once | INTRAMUSCULAR | Status: AC
  Administered 2023-11-26: 1 mg via INTRAVENOUS
  Filled 2023-11-26: qty 1

## 2023-11-26 MED ORDER — MOMETASONE FURO-FORMOTEROL FUM 200-5 MCG/ACT IN AERO
2.0000 | INHALATION_SPRAY | Freq: Two times a day (BID) | RESPIRATORY_TRACT | Status: DC
  Administered 2023-11-26 – 2023-11-28 (×3): 2 via RESPIRATORY_TRACT
  Filled 2023-11-26: qty 8.8

## 2023-11-26 MED ORDER — NALOXONE HCL 0.4 MG/ML IJ SOLN
0.4000 mg | INTRAMUSCULAR | Status: DC | PRN
  Administered 2023-11-26: 0.2 mg via INTRAVENOUS
  Filled 2023-11-26 (×2): qty 1

## 2023-11-26 MED ORDER — HYDROMORPHONE HCL 1 MG/ML IJ SOLN: 0.5000 mg | INTRAMUSCULAR | Status: DC | PRN

## 2023-11-26 MED ORDER — HYDRALAZINE HCL 20 MG/ML IJ SOLN
10.0000 mg | INTRAMUSCULAR | Status: DC | PRN
  Administered 2023-11-26: 10 mg via INTRAVENOUS
  Filled 2023-11-26: qty 1

## 2023-11-26 MED ORDER — SODIUM CHLORIDE 0.9% FLUSH
3.0000 mL | Freq: Two times a day (BID) | INTRAVENOUS | Status: DC
  Administered 2023-11-26 – 2023-11-27 (×3): 3 mL via INTRAVENOUS

## 2023-11-26 MED ORDER — SODIUM CHLORIDE 0.9 % IV SOLN: 250.0000 mL | INTRAVENOUS | Status: AC | PRN

## 2023-11-26 MED ORDER — POTASSIUM CHLORIDE 2 MEQ/ML IV SOLN
INTRAVENOUS | Status: AC
  Filled 2023-11-26 (×3): qty 1000

## 2023-11-26 MED ORDER — IOHEXOL 350 MG/ML SOLN
75.0000 mL | Freq: Once | INTRAVENOUS | Status: AC | PRN
  Administered 2023-11-26: 75 mL via INTRAVENOUS

## 2023-11-26 MED ORDER — HYDROMORPHONE HCL 1 MG/ML IJ SOLN
0.5000 mg | INTRAMUSCULAR | Status: DC | PRN
  Administered 2023-11-26 – 2023-11-27 (×4): 1 mg via INTRAVENOUS
  Filled 2023-11-26 (×6): qty 1

## 2023-11-26 MED ORDER — PROCHLORPERAZINE EDISYLATE 10 MG/2ML IJ SOLN
10.0000 mg | Freq: Once | INTRAMUSCULAR | Status: AC
  Administered 2023-11-26: 10 mg via INTRAVENOUS
  Filled 2023-11-26: qty 2

## 2023-11-26 MED ORDER — DIATRIZOATE MEGLUMINE & SODIUM 66-10 % PO SOLN
90.0000 mL | Freq: Once | ORAL | Status: DC
  Filled 2023-11-26: qty 90
  Filled 2023-11-26: qty 120

## 2023-11-26 MED ORDER — LACTATED RINGERS IV SOLN: INTRAVENOUS | Status: DC

## 2023-11-26 MED ORDER — SENNOSIDES 8.8 MG/5ML PO SYRP
15.0000 mL | ORAL_SOLUTION | Freq: Once | ORAL | Status: DC
  Filled 2023-11-26: qty 15

## 2023-11-26 MED ORDER — SODIUM CHLORIDE 0.9% FLUSH
3.0000 mL | Freq: Two times a day (BID) | INTRAVENOUS | Status: DC
  Administered 2023-11-26: 3 mL via INTRAVENOUS

## 2023-11-26 MED ORDER — PROCHLORPERAZINE EDISYLATE 10 MG/2ML IJ SOLN
10.0000 mg | Freq: Four times a day (QID) | INTRAMUSCULAR | Status: DC | PRN
  Administered 2023-11-26: 10 mg via INTRAVENOUS
  Filled 2023-11-26: qty 2

## 2023-11-26 MED ORDER — SODIUM CHLORIDE 0.9 % IV BOLUS
1000.0000 mL | Freq: Once | INTRAVENOUS | Status: AC
  Administered 2023-11-26: 1000 mL via INTRAVENOUS

## 2023-11-26 MED ORDER — ALBUTEROL SULFATE (2.5 MG/3ML) 0.083% IN NEBU
2.5000 mg | INHALATION_SOLUTION | RESPIRATORY_TRACT | Status: DC | PRN
  Administered 2023-11-26 – 2023-11-28 (×2): 2.5 mg via RESPIRATORY_TRACT
  Filled 2023-11-26 (×2): qty 3

## 2023-11-26 MED ORDER — HEPARIN SODIUM (PORCINE) 5000 UNIT/ML IJ SOLN
5000.0000 [IU] | Freq: Three times a day (TID) | INTRAMUSCULAR | Status: DC
Start: 2023-11-26 — End: 2023-11-26

## 2023-11-26 MED ORDER — SODIUM CHLORIDE 0.9% FLUSH: 3.0000 mL | INTRAVENOUS | Status: DC | PRN

## 2023-11-26 MED ORDER — DIATRIZOATE MEGLUMINE & SODIUM 66-10 % PO SOLN: 90.0000 mL | Freq: Once | ORAL | Status: DC

## 2023-11-26 NOTE — Hospital Course (Addendum)
 Tamara Bradford is a 48 y.o. female with medical history significant of gestational preeclampsia and hypertension, anemia of chronic disease, asthma, depression, methamphetamine use, chronic alcohol use, and chronic smoking cigarette presented emergency department sudden onset of severe abdominal cramping and pain started 4 to 5 hours ago.  Reported gradually has been worsening.  Reported she has similar episode like this 2 to 3 years ago however patient is unsure what diagnosis she had during that time.  Reported nausea denies any vomiting.  Patient is still having regular bowel movements and able to pass gas.   Upon evaluation at the bedside patient was not opening her eye and not very cooperative with the history taking.  She is complaining about generalized abdominal pain.  History limited from the patient due to not very interested to talk.  Gathered information from the patient that she has an episode of small bowel obstruction few years ago and during that time it has been relieved with NG tube placement.     ED Course:  At presentation to ED patient is tachycardic heart rate 104, borderline hypertensive blood pressure of 150/98 otherwise hemodynamically stable. CMP and CBC unremarkable. Normal lipase level. Pregnancy test negative.   CT abdomen pelvis showed mid distal small bowel obstruction with single point of transition seen within the periumbilical region.   In the ED planning to start placing NG tube. Patient has received Dilaudid , Compazine and 1 L of NS bolus. Consulted general surgery Dr. Camilo Cella.  Waiting for recommendation.   Hospitalist has been consulted for further evaluation and management of small bowel obstruction.    Assessment & Plan:   Principal Problem:   Small bowel obstruction (HCC) Active Problems:   Asthma, chronic   History of depression      Assessment and Plan: Small bowel obstruction - History of abdominal surgery and previous SBO  -Abdomen remains  soft, mildly distended, improved abdominal pain -Reporting of small nonsignificant BM x 1, with no gas -Neurosurgery following, recommending continued n.p.o. - Continue IVF normal saline - Follow-up imaging today  -POA: sudden onset of abdominal pain and cramping with associated nausea.  Last BM on 4/26. -Labs within normal limits, hypokalemia 3.4-replacing with IVF  - CT abdomen pelvis showed mild distal SBO with a single point of transition seen within the periumbilical region.   - Patient refused NG tube placement  - Consulted general surgery Dr. Delane Fear following, appreciate close follow-up and recommendations.  - per general surgery okay with oral Gastrografin  with SBO protocol         Altered mental status/somnolent, transient encephalopathy - Still somnolent, but arousable, following commands   - Due to narcotics, including Dilaudid  -continue to monitor closely, s/p Narcan  administration yesterday 11/26/2023  - Urine drug screen-positive for amphetamine, opioids,   Hypertensive  Patient is found hypertensive, systolic 116-163/diastolic 91-1 02 -Initiating p.o. medication of Norvasc  5 mg daily   Hypokalemia  - Replaced with IV fluids, - Potassium 3.4, 3.6   history of asthma - In no respiratory distress, stable continue Dulera twice daily and albuterol  as needed.   History of depression -stable, -Currently not any antidepressant at home.   History of gestational preeclampsia and hypertension -Normotensive.

## 2023-11-26 NOTE — ED Notes (Signed)
 This RN and another RN tried two attempts at ng insertion for patinet. Patient could not tolerate and pulled tube out and would not follow instructions for ng tube insertion and stated she has done this previously in past and would keep removing tubes due to tube being uncomfortable. MD notified.
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
## 2023-11-26 NOTE — Progress Notes (Signed)
  Patient declining both NG tube placement and oral Gastrografin .  She does not want any treatment.  Reporting that she has small bowel obstruction before and it went away without any intervention. However patient is drowsy and responding intermittently to answering questions.  Given history of alcohol use, substance use and methamphetamine use checking UDS, serum drug screen and blood alcohol level.  Nishawn Rotan, MD [facility] 11/26/2023, 5:58 AM

## 2023-11-26 NOTE — Significant Event (Signed)
 Rapid Response Event Note   Reason for Call :  Unresponsive Received 1mg  IV Dilaudid  at 0515  Initial Focused Assessment:  Pupils 1mm, non-reactive. No response to noxious stimuli. Breathing regular, shallow, unlabored. RR 11. Skin warm, dry, pink. HR regular, no adventitious heart sounds. Bowel sounds faint. Abdomen soft.   VS: T 97.13F, BP 129/82, HR 67, RR 12, SpO2 100% on room air CBG: 101  Following 0.2mg  IV Narcan , pt respirations increased to 14-16. She arouses to voice and is oriented x3. PERRLA, 3mm.  Interventions:  -Narcan  0.2mg  IV   Plan of Care:  -PRN Narcan  available -UA/UDS now   Event Summary:  MD Notified: Dr. Eilene Grater, at bedside Call Time: 0753 Arrival Time: 0755 End Time: 0826  Washington Hacker, RN

## 2023-11-26 NOTE — ED Triage Notes (Addendum)
 Pt BIB [facility] from home for sharp RLQ and LLQ  pain x4 hours. does endorse nausea. Pain intensifies with palpation. H/x kidney stones

## 2023-11-26 NOTE — ED Notes (Signed)
 Pt transported to CT ?

## 2023-11-26 NOTE — Progress Notes (Signed)
 Pt arrived to unit completely un arousal not even to sternal rub she received dilaudid  at 0515 and compazine. nothing else since then, narcan  given. RR RN consulted and at bedside.
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
## 2023-11-26 NOTE — Progress Notes (Signed)
 PROGRESS NOTE    Patient: Tamara Bradford                            PCP: Patient, No Pcp Per                    DOB: [birth year]            DOA: 11/26/2023 WUJ:[id number]             DOS: 11/26/2023, 10:30 AM   LOS: 0 days   Date of Service: The patient was seen and examined on 11/26/2023  Subjective:   The patient was seen and examined this morning. Upon examination patient was found somnolent, drowsy. Dose of Dilaudid  was given in ED, patient responded to half dose of Narcan  Hemodynamically stable.   Brief Narrative:   Tamara Bradford is a 48 y.o. female with medical history significant of gestational preeclampsia and hypertension, anemia of chronic disease, asthma, depression, methamphetamine use, chronic alcohol use, and chronic smoking cigarette presented emergency department sudden onset of severe abdominal cramping and pain started 4 to 5 hours ago.  Reported gradually has been worsening.  Reported she has similar episode like this 2 to 3 years ago however patient is unsure what diagnosis she had during that time.  Reported nausea denies any vomiting.  Patient is still having regular bowel movements and able to pass gas.   Upon evaluation at the bedside patient was not opening her eye and not very cooperative with the history taking.  She is complaining about generalized abdominal pain.  History limited from the patient due to not very interested to talk.  Gathered information from the patient that she has an episode of small bowel obstruction few years ago and during that time it has been relieved with NG tube placement.     ED Course:  At presentation to ED patient is tachycardic heart rate 104, borderline hypertensive blood pressure of 150/98 otherwise hemodynamically stable. CMP and CBC unremarkable. Normal lipase level. Pregnancy test negative.   CT abdomen pelvis showed mid distal small bowel obstruction with single point of transition seen within the periumbilical  region.   In the ED planning to start placing NG tube. Patient has received Dilaudid , Compazine and 1 L of NS bolus. Consulted general surgery Dr. Camilo Cella.  Waiting for recommendation.   Hospitalist has been consulted for further evaluation and management of small bowel obstruction.    Assessment & Plan:   Principal Problem:   Small bowel obstruction (HCC) Active Problems:   Asthma, chronic   History of depression      Assessment and Plan: Small bowel obstruction - History of abdominal surgery and previous SBO  - Comfortable this morning-no gas or bowel movement reported  -Hemodynamically stable -POA: sudden onset of abdominal pain and cramping with associated nausea.  Last BM on 4/26. -Labs within normal limits, hypokalemia 3.4-replacing with IVF  - CT abdomen pelvis showed mild distal SBO with a single point of transition seen within the periumbilical region.  - In ED was treated with 1 L of NS bolus and Dilaudid  - Continue maintenance IVF normal saline - NG tube was not placed in ED  - Consulted general surgery Dr. Camilo Cella who will evaluate  - NPO    - General Surgery Dr. Camilo Cella recommended n.p.o., NG tube to low intermittent suction SBO protocol.  However patient did not tolerate NG tube placement.  Inform general surgery  recommended to give oral Gastrografin  with SBO protocol.       Altered mental status/somnolent, transient encephalopathy - Likely due to IV narcotics Dilaudid  - Patient responded to half dose of Narcan  - Easily arousable now, following.  history of asthma -Stable.  Continue Dulera twice daily and albuterol  as needed.   History of depression -Currently not any antidepressant at home.   History of gestational preeclampsia and hypertension -Normotensive.     -------------------------------------------------------------------------------------------------------------------------------------- Nutritional status:  The patient's BMI is: Body mass  index is 22.1 kg/m. I agree with the assessment and plan as outlined   ---------------------------------------------------------------------------------------------------------------------------------- Cultures; Blood Cultures x 2 >> NGT Urine Culture  >>> NGT  Sputum Culture >> NGT    ------------------------------------------------------------------------------------------------------------------------------------------------  DVT prophylaxis:  Place and maintain sequential compression device Start: 11/26/23 0453 SCDs Start: 11/26/23 0452 Place TED hose Start: 11/26/23 0452   Code Status:   Code Status: Full Code  Family Communication: No family member present at bedside--Advance care planning has been discussed.   Admission status:   Status is: Inpatient Remains inpatient appropriate because: Needing IV fluids, n.p.o., surgical eval which   Disposition: From  - home             Planning for discharge in 1-2 days: to Home   Procedures:   No admission procedures for hospital encounter.   Antimicrobials:  Anti-infectives (From admission, onward)    None        Medication:   diatrizoate  meglumine -sodium  90 mL Per NG tube Once   mometasone-formoterol   2 puff Inhalation BID   sennosides  15 mL Oral Once   sodium chloride  flush  3 mL Intravenous Q12H   sodium chloride  flush  3 mL Intravenous Q12H    sodium chloride , albuterol , hydrALAZINE, HYDROmorphone  (DILAUDID ) injection, naLOXone  (NARCAN )  injection, prochlorperazine, sodium chloride  flush   Objective:   Vitals:   11/26/23 0752 11/26/23 0800 11/26/23 0805 11/26/23 0816  BP: (!) 116/102   129/82  Pulse: 66   67  Resp: 11 12 14 12   Temp: (!) 97.5 F (36.4 C)     TempSrc: Oral     SpO2: 100%  100% 100%  Weight:      Height:       No intake or output data in the 24 hours ending 11/26/23 1030 Filed Weights   11/26/23 0245  Weight: 64 kg     Physical examination:   Constitution: Somnolent,  difficult to arouse Following command when aroused HEENT:        Normocephalic, PERRL, otherwise with in Normal limits  Chest:         Chest symmetric Cardio vascular:  S1/S2, RRR, No murmure, No Rubs or Gallops  pulmonary: Clear to auscultation bilaterally, respirations unlabored, negative wheezes / crackles Abdomen: Soft, non-tender, non-distended, hypoactive bowel sounds,  no masses, no organomegaly Muscular skeletal: Limited exam - in bed, able to move all 4 extremities,   Neuro: CNII-XII intact. , normal motor and sensation, reflexes intact  Extremities: No pitting edema lower extremities, +2 pulses  Skin: Dry, warm to touch, negative for any Rashes, No open wounds Wounds: per nursing documentation   ------------------------------------------------------------------------------------------------------------------------------------------    LABs:     Latest Ref Rng & Units 11/26/2023    6:14 AM 11/26/2023    4:06 AM 11/26/2023    2:40 AM  CBC  WBC 4.0 - 10.5 K/uL 9.4   9.8   Hemoglobin 12.0 - 15.0 g/dL 16.1  09.6  04.5   Hematocrit  36.0 - 46.0 % 41.9  46.0  44.2   Platelets 150 - 400 K/uL 246   315       Latest Ref Rng & Units 11/26/2023    6:14 AM 11/26/2023    4:06 AM 11/26/2023    2:40 AM  CMP  Glucose 70 - 99 mg/dL 161  096  045   BUN 6 - 20 mg/dL 10  12  11    Creatinine 0.44 - 1.00 mg/dL 4.09  8.11  9.14   Sodium 135 - 145 mmol/L 139  141  139   Potassium 3.5 - 5.1 mmol/L 3.4  3.5  3.6   Chloride 98 - 111 mmol/L 106  107  104   CO2 22 - 32 mmol/L 23   23   Calcium  8.9 - 10.3 mg/dL 8.3   9.4   Total Protein 6.5 - 8.1 g/dL   7.6   Total Bilirubin 0.0 - 1.2 mg/dL   0.6   Alkaline Phos 38 - 126 U/L   114   AST 15 - 41 U/L   20   ALT 0 - 44 U/L   20        Micro Results No results found for this or any previous visit (from the past 240 hours).  Radiology Reports CT ABDOMEN PELVIS W CONTRAST Result Date: 11/26/2023 CLINICAL DATA:  Unspecified abdominal pain,  bowel obstruction EXAM: CT ABDOMEN AND PELVIS WITH CONTRAST TECHNIQUE: Multidetector CT imaging of the abdomen and pelvis was performed using the standard protocol following bolus administration of intravenous contrast. RADIATION DOSE REDUCTION: This exam was performed according to the departmental dose-optimization program which includes automated exposure control, adjustment of the mA and/or kV according to patient size and/or use of iterative reconstruction technique. CONTRAST:  75mL OMNIPAQUE  IOHEXOL  350 MG/ML SOLN COMPARISON:  None Available. FINDINGS: Lower chest: No acute abnormality. Hepatobiliary: No focal liver abnormality is seen. No gallstones, gallbladder wall thickening, or biliary dilatation. Pancreas: Unremarkable Spleen: Unremarkable Adrenals/Urinary Tract: Adrenal glands are unremarkable. Kidneys are normal, without renal calculi, focal lesion, or hydronephrosis. Bladder is unremarkable. Stomach/Bowel: A mid distal small bowel obstruction is present with a single point of transition seen within the periumbilical region on axial image # 52/3 and coronal image # 98/6. The proximal small bowel is dilated fluid-filled. The distal small appears decompressed. The stomach, small bowel, and large bowel are otherwise unremarkable. Appendix normal. No free intraperitoneal gas or fluid. Vascular/Lymphatic: No significant vascular findings are present. No enlarged abdominal or pelvic lymph nodes. Reproductive: Status post hysterectomy. No adnexal masses. Other: No abdominal wall hernia. Musculoskeletal: No acute or significant osseous findings. IMPRESSION: 1. Mid distal small bowel obstruction with a single point of transition seen within the periumbilical region. Electronically Signed   By: Worthy Heads M.D.   On: 11/26/2023 04:19    SIGNED: Bobbetta Burnet, MD, FHM. FAAFP. Arlin Benes - [facility] Time spent - 55 min.  In seeing, evaluating and examining the patient. Reviewing medical records,  labs, drawn plan of care. [facility],  Pager (please use [email or web address] to page/ text) Please use Epic Secure Chat for non-urgent communication (7AM-7PM)  If 7PM-7AM, please contact night-coverage [email or web address], 11/26/2023, 10:30 AM

## 2023-11-27 ENCOUNTER — Inpatient Hospital Stay (HOSPITAL_COMMUNITY): Payer: MEDICAID

## 2023-11-27 DIAGNOSIS — K56609 Unspecified intestinal obstruction, unspecified as to partial versus complete obstruction: Secondary | ICD-10-CM | POA: Diagnosis not present

## 2023-11-27 LAB — BASIC METABOLIC PANEL WITH GFR
Anion gap: 11 (ref 5–15)
BUN: <5 mg/dL — ABNORMAL LOW (ref 6–20)
CO2: 20 mmol/L — ABNORMAL LOW (ref 22–32)
Calcium: 8.1 mg/dL — ABNORMAL LOW (ref 8.9–10.3)
Chloride: 107 mmol/L (ref 98–111)
Creatinine, Ser: 0.58 mg/dL (ref 0.44–1.00)
GFR, Estimated: >60 mL/min (ref >60)
Glucose, Bld: 85 mg/dL (ref 70–99)
Potassium: 3.6 mmol/L (ref 3.5–5.1)
Sodium: 138 mmol/L (ref 135–145)

## 2023-11-27 MED ORDER — AMLODIPINE BESYLATE 5 MG PO TABS
5.0000 mg | ORAL_TABLET | Freq: Every day | ORAL | Status: DC
  Administered 2023-11-27 – 2023-11-28 (×2): 5 mg via ORAL
  Filled 2023-11-27 (×2): qty 1

## 2023-11-27 MED ORDER — DIATRIZOATE MEGLUMINE & SODIUM 66-10 % PO SOLN
90.0000 mL | Freq: Once | ORAL | Status: AC
  Administered 2023-11-27: 90 mL via NASOGASTRIC
  Filled 2023-11-27: qty 90

## 2023-11-27 NOTE — Progress Notes (Signed)
   Subjective/Chief Complaint: Bm, no flatus, still distended, refused ng and gg   Objective: Vital signs in last 24 hours: Temp:  [97.8 F (36.6 C)-98.7 F (37.1 C)] 98.4 F (36.9 C) (04/28 0831) Pulse Rate:  [69-90] 78 (04/28 0831) Resp:  [16-18] 16 (04/28 0831) BP: (129-163)/(78-99) 129/88 (04/28 0831) SpO2:  [91 %-100 %] 95 % (04/28 0831) Last BM Date : 11/26/23  Intake/Output from previous day: No intake/output data recorded. Intake/Output this shift: No intake/output data recorded.  Ab mild distended nontender low midline healed  Lab Results:  Recent Labs    11/26/23 0240 11/26/23 0406 11/26/23 0614  WBC 9.8  --  9.4  HGB 15.0 15.6* 13.7  HCT 44.2 46.0 41.9  PLT 315  --  246   BMET Recent Labs    11/26/23 0614 11/27/23 0509  NA 139 138  K 3.4* 3.6  CL 106 107  CO2 23 20*  GLUCOSE 144* 85  BUN 10 <5*  CREATININE 0.88 0.58  CALCIUM  8.3* 8.1*   PT/INR Recent Labs    11/26/23 0614  LABPROT 14.0  INR 1.1   ABG No results for input(s): "PHART", "HCO3" in the last 72 hours.  Invalid input(s): "PCO2", "PO2"  Studies/Results: CT ABDOMEN PELVIS W CONTRAST Result Date: 11/26/2023 CLINICAL DATA:  Unspecified abdominal pain, bowel obstruction EXAM: CT ABDOMEN AND PELVIS WITH CONTRAST TECHNIQUE: Multidetector CT imaging of the abdomen and pelvis was performed using the standard protocol following bolus administration of intravenous contrast. RADIATION DOSE REDUCTION: This exam was performed according to the departmental dose-optimization program which includes automated exposure control, adjustment of the mA and/or kV according to patient size and/or use of iterative reconstruction technique. CONTRAST:  75mL OMNIPAQUE  IOHEXOL  350 MG/ML SOLN COMPARISON:  None Available. FINDINGS: Lower chest: No acute abnormality. Hepatobiliary: No focal liver abnormality is seen. No gallstones, gallbladder wall thickening, or biliary dilatation. Pancreas: Unremarkable Spleen:  Unremarkable Adrenals/Urinary Tract: Adrenal glands are unremarkable. Kidneys are normal, without renal calculi, focal lesion, or hydronephrosis. Bladder is unremarkable. Stomach/Bowel: A mid distal small bowel obstruction is present with a single point of transition seen within the periumbilical region on axial image # 52/3 and coronal image # 98/6. The proximal small bowel is dilated fluid-filled. The distal small appears decompressed. The stomach, small bowel, and large bowel are otherwise unremarkable. Appendix normal. No free intraperitoneal gas or fluid. Vascular/Lymphatic: No significant vascular findings are present. No enlarged abdominal or pelvic lymph nodes. Reproductive: Status post hysterectomy. No adnexal masses. Other: No abdominal wall hernia. Musculoskeletal: No acute or significant osseous findings. IMPRESSION: 1. Mid distal small bowel obstruction with a single point of transition seen within the periumbilical region. Electronically Signed   By: Worthy Heads M.D.   On: 11/26/2023 04:19    Anti-infectives: Anti-infectives (From admission, onward)    None       Assessment/Plan: SBO -refuses ng tube as it was uncomfortable last time she had this -states will go contrast so I wrote for oral GG (she is not n/v right now) and check an 8 hour film -she really has no bowel function (one bm no flatus) and still distended so really needs to be npo -I told her that attempt to avoid surgery is the goal  I reviewed hospitalist notes, last 24 h vitals and pain scores, last 24 h labs and trends, and last 24 h imaging results.   Enid Harry 11/27/2023

## 2023-11-27 NOTE — Progress Notes (Signed)
 Hello, Pt is asking if she can eat something. She is had two bowel movements for me. Bowel sounds are present/hypoactive. Pt is currently ambulating on the hallway. Please clarify diet orders. Thks. Paged MD  Enid Harry

## 2023-11-27 NOTE — Plan of Care (Signed)

## 2023-11-27 NOTE — Progress Notes (Addendum)
 PROGRESS NOTE    Patient: Tamara Bradford                            PCP: Patient, No Pcp Per                    DOB: 1975-09-15            DOA: 11/26/2023 WUJ:[id number]             DOS: 11/27/2023, 10:01 AM   LOS: 1 day   Date of Service: The patient was seen and examined on 11/27/2023  Subjective:   The patient was seen and examined this morning, in no acute distress complaining of generalized weaknesses.  Somnolent but arousable, following commands Per patient and nursing at nonsignificant small BM x 1 with no gas.   Brief Narrative:   Alexandra L Rossin is a 48 y.o. female with medical history significant of gestational preeclampsia and hypertension, anemia of chronic disease, asthma, depression, methamphetamine use, chronic alcohol use, and chronic smoking cigarette presented emergency department sudden onset of severe abdominal cramping and pain started 4 to 5 hours ago.  Reported gradually has been worsening.  Reported she has similar episode like this 2 to 3 years ago however patient is unsure what diagnosis she had during that time.  Reported nausea denies any vomiting.  Patient is still having regular bowel movements and able to pass gas.   Upon evaluation at the bedside patient was not opening her eye and not very cooperative with the history taking.  She is complaining about generalized abdominal pain.  History limited from the patient due to not very interested to talk.  Gathered information from the patient that she has an episode of small bowel obstruction few years ago and during that time it has been relieved with NG tube placement.     ED Course:  At presentation to ED patient is tachycardic heart rate 104, borderline hypertensive blood pressure of 150/98 otherwise hemodynamically stable. CMP and CBC unremarkable. Normal lipase level. Pregnancy test negative.   CT abdomen pelvis showed mid distal small bowel obstruction with single point of transition seen within the  periumbilical region.   In the ED planning to start placing NG tube. Patient has received Dilaudid , Compazine and 1 L of NS bolus. Consulted general surgery Dr. Camilo Cella.  Waiting for recommendation.   Hospitalist has been consulted for further evaluation and management of small bowel obstruction.    Assessment & Plan:   Principal Problem:   Small bowel obstruction (HCC) Active Problems:   Asthma, chronic   History of depression      Assessment and Plan: Small bowel obstruction - History of abdominal surgery and previous SBO  -Abdomen remains soft, mildly distended, improved abdominal pain -Reporting of small nonsignificant BM x 1, with no gas -Neurosurgery following, recommending continued n.p.o. - Continue IVF normal saline - Follow-up imaging today  -POA: sudden onset of abdominal pain and cramping with associated nausea.  Last BM on 4/26. -Labs within normal limits, hypokalemia 3.4-replacing with IVF  - CT abdomen pelvis showed mild distal SBO with a single point of transition seen within the periumbilical region.   - Patient refused NG tube placement  - Consulted general surgery Dr. Delane Fear following, appreciate close follow-up and recommendations.  - per general surgery okay with oral Gastrografin  with SBO protocol         Altered mental status/somnolent, transient encephalopathy -  Still somnolent, but arousable, following commands   - Due to narcotics, including Dilaudid  -continue to monitor closely, s/p Narcan  administration yesterday 11/26/2023  - Urine drug screen-positive for amphetamine, opioids,   Hypertensive  Patient is found hypertensive, systolic 116-163/diastolic 91-1 02 -Initiating p.o. medication of Norvasc  5 mg daily   Hypokalemia  - Replaced with IV fluids, - Potassium 3.4, 3.6   history of asthma - In no respiratory distress, stable continue Dulera twice daily and albuterol  as needed.   History of depression -stable, -Currently  not any antidepressant at home.   History of gestational preeclampsia and hypertension -Normotensive.     -------------------------------------------------------------------------------------------------------------------------------------- Nutritional status:  The patient's BMI is: Body mass index is 22.1 kg/m. I agree with the assessment and plan as outlined   ------------------------------------------------------------------------------------------------------------------------------------  DVT prophylaxis:  Place and maintain sequential compression device Start: 11/26/23 0453 SCDs Start: 11/26/23 0452 Place TED hose Start: 11/26/23 0452   Code Status:   Code Status: Full Code  Family Communication: No family member present at bedside--Advance care planning has been discussed.   Admission status:   Status is: Inpatient Remains inpatient appropriate because: Needing IV fluids, n.p.o., surgical eval which   Disposition: From  - home             Planning for discharge in 1-2 days: to Home   Procedures:   No admission procedures for hospital encounter.   Antimicrobials:  Anti-infectives (From admission, onward)    None        Medication:   amLODipine   5 mg Oral Daily   diatrizoate  meglumine -sodium  90 mL Per NG tube Once   mometasone-formoterol   2 puff Inhalation BID   sennosides  15 mL Oral Once   sodium chloride  flush  3 mL Intravenous Q12H   sodium chloride  flush  3 mL Intravenous Q12H    albuterol , hydrALAZINE, HYDROmorphone  (DILAUDID ) injection, naLOXone  (NARCAN )  injection, prochlorperazine, sodium chloride  flush   Objective:   Vitals:   11/27/23 0000 11/27/23 0429 11/27/23 0805 11/27/23 0831  BP: 136/78 (!) 163/91 (!) 133/91 129/88  Pulse: 90 81 80 78  Resp: 16 16 16 16   Temp: 98.7 F (37.1 C) 98 F (36.7 C) 98 F (36.7 C) 98.4 F (36.9 C)  TempSrc: Oral Oral Oral Oral  SpO2: 97% 91% 95% 95%  Weight:      Height:       No intake or  output data in the 24 hours ending 11/27/23 1001 Filed Weights   11/26/23 0245  Weight: 64 kg     Physical examination:        General:  Somnolent, arousable, following commands  HEENT:  Normocephalic, PERRL, otherwise with in Normal limits   Neuro:  CNII-XII intact. , normal motor and sensation, reflexes intact   Lungs:   Clear to auscultation BL, Respirations unlabored,  No wheezes / crackles  Cardio:    S1/S2, RRR, No murmure, No Rubs or Gallops   Abdomen:  Soft, mild diffuse tenderness, hypoactive bowel sounds no guarding or peritoneal signs.  Muscular  skeletal:  Limited exam -global generalized weaknesses - in bed, able to move all 4 extremities,   2+ pulses,  symmetric, No pitting edema  Skin:  Dry, warm to touch, negative for any Rashes,  Wounds: Please see nursing documentation          ------------------------------------------------------------------------------------------------------------------------------------------    LABs:     Latest Ref Rng & Units 11/26/2023    6:14 AM 11/26/2023    4:06  AM 11/26/2023    2:40 AM  CBC  WBC 4.0 - 10.5 K/uL 9.4   9.8   Hemoglobin 12.0 - 15.0 g/dL 11.9  14.7  82.9   Hematocrit 36.0 - 46.0 % 41.9  46.0  44.2   Platelets 150 - 400 K/uL 246   315       Latest Ref Rng & Units 11/27/2023    5:09 AM 11/26/2023    6:14 AM 11/26/2023    4:06 AM  CMP  Glucose 70 - 99 mg/dL 85  562  130   BUN 6 - 20 mg/dL 5  10  12    Creatinine 0.44 - 1.00 mg/dL 8.65  7.84  6.96   Sodium 135 - 145 mmol/L 138  139  141   Potassium 3.5 - 5.1 mmol/L 3.6  3.4  3.5   Chloride 98 - 111 mmol/L 107  106  107   CO2 22 - 32 mmol/L 20  23    Calcium  8.9 - 10.3 mg/dL 8.1  8.3         Micro Results No results found for this or any previous visit (from the past 240 hours).  Radiology Reports No results found.   SIGNED: Bobbetta Burnet, MD, FHM. FAAFP. Arlin Benes - [facility]ist Time spent - 55 min.  In seeing, evaluating and  examining the patient. Reviewing medical records, labs, drawn plan of care. [facility],  Pager (please use [email or web address] to page/ text) Please use Epic Secure Chat for non-urgent communication (7AM-7PM)  If 7PM-7AM, please contact night-coverage [email or web address], 11/27/2023, 10:01 AM

## 2023-11-28 DIAGNOSIS — K56609 Unspecified intestinal obstruction, unspecified as to partial versus complete obstruction: Secondary | ICD-10-CM | POA: Diagnosis not present

## 2023-11-28 LAB — BASIC METABOLIC PANEL WITH GFR
Anion gap: 6 (ref 5–15)
BUN: <5 mg/dL — ABNORMAL LOW (ref 6–20)
CO2: 23 mmol/L (ref 22–32)
Calcium: 8.1 mg/dL — ABNORMAL LOW (ref 8.9–10.3)
Chloride: 107 mmol/L (ref 98–111)
Creatinine, Ser: 0.59 mg/dL (ref 0.44–1.00)
GFR, Estimated: >60 mL/min (ref >60)
Glucose, Bld: 102 mg/dL — ABNORMAL HIGH (ref 70–99)
Potassium: 3.6 mmol/L (ref 3.5–5.1)
Sodium: 136 mmol/L (ref 135–145)

## 2023-11-28 MED ORDER — SENNOSIDES-DOCUSATE SODIUM 8.6-50 MG PO TABS: 1.0000 | ORAL_TABLET | Freq: Every day | ORAL | 0 refills | Status: AC

## 2023-11-28 MED ORDER — AMLODIPINE BESYLATE 5 MG PO TABS: 5.0000 mg | ORAL_TABLET | Freq: Every day | ORAL | 1 refills | Status: AC

## 2023-11-28 NOTE — Discharge Summary (Signed)
 Physician Discharge Summary   Patient: Tamara Bradford MRN: [id number] DOB: Sep 22, 1975  Admit date:     11/26/2023  Discharge date: 11/28/23  Discharge Physician: Bobbetta Burnet   PCP: Patient, No Pcp Per   Recommendations at discharge:  Follow with the PCP in 1 week Advance diet slowly Continue stool softeners On this admission Norvasc  for better BP control was added.    Discharge Diagnoses: Principal Problem:   Small bowel obstruction (HCC) Active Problems:   Asthma, chronic   History of depression  Resolved Problems:   SBO (small bowel obstruction) [facility])  Hospital Course: Tamara Bradford is a 48 y.o. female with medical history significant of gestational preeclampsia and hypertension, anemia of chronic disease, asthma, depression, methamphetamine use, chronic alcohol use, and chronic smoking cigarette presented emergency department sudden onset of severe abdominal cramping and pain started 4 to 5 hours ago.  Reported gradually has been worsening.  Reported she has similar episode like this 2 to 3 years ago however patient is unsure what diagnosis she had during that time.  Reported nausea denies any vomiting.  Patient is still having regular bowel movements and able to pass gas.   Upon evaluation at the bedside patient was not opening her eye and not very cooperative with the history taking.  She is complaining about generalized abdominal pain.  History limited from the patient due to not very interested to talk.  Gathered information from the patient that she has an episode of small bowel obstruction few years ago and during that time it has been relieved with NG tube placement.     ED Course:  At presentation to ED patient is tachycardic heart rate 104, borderline hypertensive blood pressure of 150/98 otherwise hemodynamically stable. CMP and CBC unremarkable. Normal lipase level. Pregnancy test negative.   CT abdomen pelvis showed mid distal small bowel obstruction with  single point of transition seen within the periumbilical region.   In the ED planning to start placing NG tube. Patient has received Dilaudid , Compazine and 1 L of NS bolus. Consulted general surgery Dr. Camilo Cella.  Waiting for recommendation.   Hospitalist has been consulted for further evaluation and management of small bowel obstruction.  Small bowel obstruction - History of abdominal surgery and previous SBO - Resolved spontaneously, positive gas, with bowel movements multiple -Tolerating p.o., advance as tolerated.  -Abdomen remains soft, mildly distended, improved abdominal pain  -/P IV fluid hydration - Continue IVF normal saline - Follow-up imaging-reporting resolution of SBO  -POA: sudden onset of abdominal pain and cramping with associated nausea.  Last BM on 4/26. -Labs within normal limits, hypokalemia 3.4-replacing with IVF  - CT abdomen pelvis showed mild distal SBO with a single point of transition seen within the periumbilical region.   - Patient refused NG tube placement  -General Surgery was consulted, was following closely.        Altered mental status/somnolent, transient encephalopathy - Oriented x 4 today  - Due to narcotics, including Dilaudid  -continue to monitor closely, s/p Narcan  administration yesterday 11/26/2023  - Urine drug screen-positive for amphetamine, opioids, - Rotation back to baseline   Hypertensive  Patient is found hypertensive, systolic 116-163/diastolic 91-1 02 - Started p.o. medication of Norvasc  5 mg daily   Hypokalemia  - Replaced with IV fluids, - Potassium 3.4, 3.6   history of asthma - In no respiratory distress, stable continue Dulera twice daily and albuterol  as needed.   History of depression -stable, -Currently not any antidepressant at  home.   History of gestational preeclampsia and hypertension -Normotensive.    Disposition: Home Diet recommendation:  Discharge Diet Orders (From admission, onward)      Start     Ordered   11/28/23 0000  Diet - low sodium heart healthy        11/28/23 1026           Cardiac diet DISCHARGE MEDICATION: Allergies as of 11/28/2023       Reactions   Cinnamon Hives   Keflex [cephalexin] Hives   Naproxen Rash        Medication List     STOP taking these medications    Ciclopirox  0.77 % gel       TAKE these medications    acetaminophen  325 MG tablet Commonly known as: TYLENOL  Take 975 mg by mouth every 6 (six) hours as needed for mild pain (pain score 1-3).   albuterol  108 (90 Base) MCG/ACT inhaler Commonly known as: VENTOLIN  HFA Inhale 1-2 puffs into the lungs every 6 (six) hours as needed for wheezing or shortness of breath. What changed: how much to take   amLODipine  5 MG tablet Commonly known as: NORVASC  Take 1 tablet (5 mg total) by mouth daily. Start taking on: November 29, 2023   aspirin  EC 81 MG tablet Take 1 tablet (81 mg total) by mouth daily. Take after 12 weeks for prevention of preeclampsia later in pregnancy   budesonide -formoterol  160-4.5 MCG/ACT inhaler Commonly known as: SYMBICORT  Inhale 2 puffs into the lungs in the morning and at bedtime.   loratadine 10 MG tablet Commonly known as: CLARITIN Take 10 mg by mouth daily as needed for allergies.   senna-docusate 8.6-50 MG tablet Commonly known as: Senokot-S Take 1 tablet by mouth daily.        Discharge Exam: Filed Weights   11/26/23 0245  Weight: 64 kg        General:  AAO x 3,  cooperative, no distress;   HEENT:  Normocephalic, PERRL, otherwise with in Normal limits   Neuro:  CNII-XII intact. , normal motor and sensation, reflexes intact   Lungs:   Clear to auscultation BL, Respirations unlabored,  No wheezes / crackles  Cardio:    S1/S2, RRR, No murmure, No Rubs or Gallops   Abdomen:  Soft, non-tender, bowel sounds active all four quadrants, no guarding or peritoneal signs.  Muscular  skeletal:  Limited exam -global generalized weaknesses -  in bed, able to move all 4 extremities,   2+ pulses,  symmetric, No pitting edema  Skin:  Dry, warm to touch, negative for any Rashes,  Wounds: Please see nursing documentation          Condition at discharge: good  The results of significant diagnostics from this hospitalization (including imaging, microbiology, ancillary and laboratory) are listed below for reference.   Imaging Studies: DG Abd Portable 1V-Small Bowel Obstruction Protocol-initial, 8 hr delay Result Date: 11/27/2023 EXAM: 1 VIEW XRAY OF THE ABDOMEN SUPINE 11/27/2023 07:22:00 PM COMPARISON: None available. CLINICAL HISTORY: 8 hour delay for SBO. FINDINGS: BOWEL: Contrast opacifies the right colon, extending from the cecum to the proximal descending colon. This appearance argues against small bowel obstruction. PERITONEUM AND SOFT TISSUES: No abnormal calcifications. BONES: No acute osseous abnormality. IMPRESSION: 1. Contrast opacifies the right colon, extending from the cecum to the proximal descending colon. This appearance argues against small bowel obstruction. Electronically signed by: Zadie Herter MD 11/27/2023 07:58 PM EDT RP Workstation: [id number]   DG Abd 1 View  Result Date: 11/27/2023 CLINICAL DATA:  Abdominal pain.  Small-bowel obstruction. EXAM: ABDOMEN - 1 VIEW COMPARISON:  CT 11/26/2023 and 10/27/2022. FINDINGS: 0859 hours. Interval improvement in previously demonstrated small bowel distension. There is increased gas within the colon, extending into the rectum. No supine evidence of pneumoperitoneum. No suspicious abdominal calcifications. The bones appear unremarkable. IMPRESSION: Improving partial small bowel obstruction with increasing gas within the colon. Electronically Signed   By: Elmon Hagedorn M.D.   On: 11/27/2023 12:51   CT ABDOMEN PELVIS W CONTRAST Result Date: 11/26/2023 CLINICAL DATA:  Unspecified abdominal pain, bowel obstruction EXAM: CT ABDOMEN AND PELVIS WITH CONTRAST TECHNIQUE:  Multidetector CT imaging of the abdomen and pelvis was performed using the standard protocol following bolus administration of intravenous contrast. RADIATION DOSE REDUCTION: This exam was performed according to the departmental dose-optimization program which includes automated exposure control, adjustment of the mA and/or kV according to patient size and/or use of iterative reconstruction technique. CONTRAST:  75mL OMNIPAQUE  IOHEXOL  350 MG/ML SOLN COMPARISON:  None Available. FINDINGS: Lower chest: No acute abnormality. Hepatobiliary: No focal liver abnormality is seen. No gallstones, gallbladder wall thickening, or biliary dilatation. Pancreas: Unremarkable Spleen: Unremarkable Adrenals/Urinary Tract: Adrenal glands are unremarkable. Kidneys are normal, without renal calculi, focal lesion, or hydronephrosis. Bladder is unremarkable. Stomach/Bowel: A mid distal small bowel obstruction is present with a single point of transition seen within the periumbilical region on axial image # 52/3 and coronal image # 98/6. The proximal small bowel is dilated fluid-filled. The distal small appears decompressed. The stomach, small bowel, and large bowel are otherwise unremarkable. Appendix normal. No free intraperitoneal gas or fluid. Vascular/Lymphatic: No significant vascular findings are present. No enlarged abdominal or pelvic lymph nodes. Reproductive: Status post hysterectomy. No adnexal masses. Other: No abdominal wall hernia. Musculoskeletal: No acute or significant osseous findings. IMPRESSION: 1. Mid distal small bowel obstruction with a single point of transition seen within the periumbilical region. Electronically Signed   By: Worthy Heads M.D.   On: 11/26/2023 04:19    Microbiology: Results for orders placed or performed during the hospital encounter of 10/27/22  Wet prep, genital     Status: None   Collection Time: 10/27/22  3:00 AM   Specimen: PATH Cytology Cervicovaginal Ancillary Only  Result Value  Ref Range Status   Yeast Wet Prep HPF POC NONE SEEN NONE SEEN Final    Comment: Specimen diluted due to transport tube containing more than 1 ml of saline, interpret results with caution.   Trich, Wet Prep NONE SEEN NONE SEEN Final   Clue Cells Wet Prep HPF POC NONE SEEN NONE SEEN Final   WBC, Wet Prep HPF POC <10 <10 Final   Sperm NONE SEEN  Final    Comment: Performed at [facility], [address], [place], [place] [address]    Labs: CBC: Recent Labs  Lab 11/26/23 0240 11/26/23 0406 11/26/23 0614  WBC 9.8  --  9.4  NEUTROABS 6.3  --   --   HGB 15.0 15.6* 13.7  HCT 44.2 46.0 41.9  MCV 88.4  --  92.7  PLT 315  --  246   Basic Metabolic Panel: Recent Labs  Lab 11/26/23 0240 11/26/23 0406 11/26/23 0614 11/27/23 0509 11/28/23 0511  NA 139 141 139 138 136  K 3.6 3.5 3.4* 3.6 3.6  CL 104 107 106 107 107  CO2 23  --  23 20* 23  GLUCOSE 114* 115* 144* 85 102*  BUN 11 12 10  <  5* <5*  CREATININE 0.86 0.90 0.88 0.58 0.59  CALCIUM  9.4  --  8.3* 8.1* 8.1*   Liver Function Tests: Recent Labs  Lab 11/26/23 0240  AST 20  ALT 20  ALKPHOS 114  BILITOT 0.6  PROT 7.6  ALBUMIN 3.6   CBG: Recent Labs  Lab 11/26/23 0824  GLUCAP 101*    Discharge time spent: greater than 30 minutes.  Signed: Bobbetta Burnet, MD [facility] 11/28/2023

## 2023-11-28 NOTE — Progress Notes (Signed)
   Subjective/Chief Complaint: AXR showed contrast in the colon and she has had multiple BM.  Tolerating PO and denies nausea or emesis.    Objective: Vital signs in last 24 hours: Temp:  [97.7 F (36.5 C)-98.6 F (37 C)] 97.7 F (36.5 C) (04/29 0845) Pulse Rate:  [71-89] 88 (04/29 0845) Resp:  [16-18] 18 (04/29 0424) BP: (127-147)/(75-95) 130/85 (04/29 0853) SpO2:  [95 %-99 %] 97 % (04/29 0424) Last BM Date : 11/28/23  Intake/Output from previous day: 04/28 0701 - 04/29 0700 In: -  Out: 2 [Stool:2] Intake/Output this shift: No intake/output data recorded.  Abd: non-distended, non-tender  Lab Results:  Recent Labs    11/26/23 0240 11/26/23 0406 11/26/23 0614  WBC 9.8  --  9.4  HGB 15.0 15.6* 13.7  HCT 44.2 46.0 41.9  PLT 315  --  246   BMET Recent Labs    11/27/23 0509 11/28/23 0511  NA 138 136  K 3.6 3.6  CL 107 107  CO2 20* 23  GLUCOSE 85 102*  BUN <5* <5*  CREATININE 0.58 0.59  CALCIUM  8.1* 8.1*   PT/INR Recent Labs    11/26/23 0614  LABPROT 14.0  INR 1.1   ABG No results for input(s): "PHART", "HCO3" in the last 72 hours.  Invalid input(s): "PCO2", "PO2"  Studies/Results: DG Abd Portable 1V-Small Bowel Obstruction Protocol-initial, 8 hr delay Result Date: 11/27/2023 EXAM: 1 VIEW XRAY OF THE ABDOMEN SUPINE 11/27/2023 07:22:00 PM COMPARISON: None available. CLINICAL HISTORY: 8 hour delay for SBO. FINDINGS: BOWEL: Contrast opacifies the right colon, extending from the cecum to the proximal descending colon. This appearance argues against small bowel obstruction. PERITONEUM AND SOFT TISSUES: No abnormal calcifications. BONES: No acute osseous abnormality. IMPRESSION: 1. Contrast opacifies the right colon, extending from the cecum to the proximal descending colon. This appearance argues against small bowel obstruction. Electronically signed by: Zadie Herter MD 11/27/2023 07:58 PM EDT RP Workstation: [id number]   DG Abd 1 View Result Date:  11/27/2023 CLINICAL DATA:  Abdominal pain.  Small-bowel obstruction. EXAM: ABDOMEN - 1 VIEW COMPARISON:  CT 11/26/2023 and 10/27/2022. FINDINGS: 0859 hours. Interval improvement in previously demonstrated small bowel distension. There is increased gas within the colon, extending into the rectum. No supine evidence of pneumoperitoneum. No suspicious abdominal calcifications. The bones appear unremarkable. IMPRESSION: Improving partial small bowel obstruction with increasing gas within the colon. Electronically Signed   By: Elmon Hagedorn M.D.   On: 11/27/2023 12:51    Anti-infectives: Anti-infectives (From admission, onward)    None       Assessment/Plan: SBO - No clinical signs of obstruction at this time. She is tolerating PO and having bowel function - Continue regular diet - Okay for discharge from surgery perspective - Please reach out with questions or concerns  I reviewed hospitalist notes, last 24 h vitals and pain scores, last 24 h labs and trends, and last 24 h imaging results.   Cannon Champion 11/28/2023

## 2023-11-28 NOTE — Progress Notes (Signed)
 Came to room for inhaler, pt is unavailable at this time.

## 2023-12-05 LAB — AMPHETAMINES/MDMA,MS,WB/SP RFX
Amphetamine: 64 ng/mL
Amphetamines Confirmation: POSITIVE
MDA: NEGATIVE ng/mL
MDEA: NEGATIVE ng/mL
MDMA: NEGATIVE ng/mL
Methamphetamine: 380 ng/mL

## 2023-12-05 LAB — OPIATES,MS,WB/SP RFX
6-Acetylmorphine: NEGATIVE
Codeine: NEGATIVE ng/mL
Dihydrocodeine: NEGATIVE ng/mL
Hydrocodone: NEGATIVE ng/mL
Hydromorphone: 2.3 ng/mL
Morphine: NEGATIVE ng/mL
Opiate Confirmation: POSITIVE

## 2023-12-05 LAB — DRUG SCREEN 10 W/CONF, SERUM
Amphetamines, IA: POSITIVE ng/mL — AB
Barbiturates, IA: NEGATIVE ug/mL
Benzodiazepines, IA: NEGATIVE ng/mL
Cocaine & Metabolite, IA: NEGATIVE ng/mL
Methadone, IA: NEGATIVE ng/mL
Opiates, IA: POSITIVE ng/mL — AB
Oxycodones, IA: NEGATIVE ng/mL
Phencyclidine, IA: NEGATIVE ng/mL
Propoxyphene, IA: NEGATIVE ng/mL
THC(Marijuana) Metabolite, IA: NEGATIVE ng/mL

## 2023-12-05 LAB — OXYCODONES,MS,WB/SP RFX
Oxycocone: NEGATIVE ng/mL
Oxycodones Confirmation: NEGATIVE
Oxymorphone: NEGATIVE ng/mL

## 2024-03-05 ENCOUNTER — Encounter (HOSPITAL_COMMUNITY): Payer: Self-pay

## 2024-03-05 ENCOUNTER — Other Ambulatory Visit: Payer: Self-pay

## 2024-03-05 ENCOUNTER — Emergency Department (HOSPITAL_COMMUNITY)
Admission: EM | Admit: 2024-03-05 | Discharge: 2024-03-05 | Disposition: A | Discharge status: Home / Self Care | Payer: MEDICAID | Attending: Emergency Medicine | Admitting: Emergency Medicine

## 2024-03-05 DIAGNOSIS — R21 Rash and other nonspecific skin eruption: Secondary | ICD-10-CM | POA: Diagnosis not present

## 2024-03-05 DIAGNOSIS — R0602 Shortness of breath: Secondary | ICD-10-CM | POA: Diagnosis present

## 2024-03-05 DIAGNOSIS — J45901 Unspecified asthma with (acute) exacerbation: Secondary | ICD-10-CM | POA: Insufficient documentation

## 2024-03-05 MED ORDER — ALBUTEROL SULFATE HFA 108 (90 BASE) MCG/ACT IN AERS
2.0000 | INHALATION_SPRAY | RESPIRATORY_TRACT | Status: DC | PRN
  Filled 2024-03-05: qty 6.7

## 2024-03-05 MED ORDER — PREDNISONE 20 MG PO TABS: 40.0000 mg | ORAL_TABLET | Freq: Every day | ORAL | 0 refills | Status: AC

## 2024-03-05 MED ORDER — BUDESONIDE-FORMOTEROL FUMARATE 160-4.5 MCG/ACT IN AERO: 2.0000 | INHALATION_SPRAY | Freq: Two times a day (BID) | RESPIRATORY_TRACT | 0 refills | Status: AC

## 2024-03-05 NOTE — ED Provider Notes (Signed)
 [facility]EMERGENCY DEPT [facility] Emergency Department Provider Note MRN:  [id number]  Arrival date & time: 03/05/24     Chief Complaint   Respiratory Distress   History of Present Illness   Tamara Bradford is a 48 y.o. year-old female presents to the ED with chief complaint of asthma exacerbation.  Patient states that she could not find her rescue inhaler.  She states that she thinks that the place where she is staying may have mold, and this may have triggered her symptoms.  Also may have been worsened by recent temperature changes.  She states that she woke up feeling quite short of breath.  EMS was called and they gave her 125 mg of Solu-Medrol  and a nebulizer treatment.  She states that she felt much better, and did not think that she needed to be transported to the hospital.  She continues to feel well now.  She denies fevers or chills.  She states she has had some slight cough, but this is not abnormal for her.  She also states that she normally takes Symbicort , but has been out of this and has not been back to her doctor.  She is requesting a refill.  She also states that she has noticed a rash on her body, which developed after moving into a hotel she is currently staying in.[name]  History provided by patient.   Review of Systems  Pertinent positive and negative review of systems noted in HPI.    Physical Exam   Vitals:   03/05/24 0555  BP: 133/83  Pulse: 78  Resp: 18  Temp: 97.8 F (36.6 C)  SpO2: 100%    CONSTITUTIONAL:  non toxic-appearing, NAD NEURO:  Alert and oriented x 3, CN 3-12 grossly intact EYES:  eyes equal and reactive ENT/NECK:  Supple, no stridor  CARDIO:  normal rate, regular rhythm, appears well-perfused  PULM:  No respiratory distress, CTAB GI/GU:  non-distended,  MSK/SPINE:  No gross deformities, no edema, moves all extremities  SKIN:  mild maculopapular rash on arms, atraumatic   *Additional and/or pertinent findings included in MDM  below  Diagnostic and Interventional Summary    EKG Interpretation Date/Time:    Ventricular Rate:    PR Interval:    QRS Duration:    QT Interval:    QTC Calculation:   R Axis:      Text Interpretation:         Labs Reviewed - No data to display  No orders to display    Medications  albuterol  (VENTOLIN  HFA) 108 (90 Base) MCG/ACT inhaler 2 puff (has no administration in time range)     Procedures  /  Critical Care Procedures  ED Course and Medical Decision Making  I have reviewed the triage vital signs, the nursing notes, and pertinent available records from the EMR.  Social Determinants Affecting Complexity of Care: Patient has decreased access to medical care.   ED Course:    Medical Decision Making Patient here with asthma exacerbation.  Her symptoms have essentially resolved after treatment with EMS.  She looks very well now.  Vital signs are stable.  She does have a rash on her extremities, which she believes is from mold in a hotel where she is staying.  It looks more characteristic of a contact dermatitis.  I think that she would benefit from some prednisone .  I will refill her Symbicort  as requested.  Will discharge home with a new rescue inhaler.  Patient does not want any further  workup or evaluation.  I do not think that any further workup or evaluation is needed.  Will discharge home.  Risk Prescription drug management.         Consultants: No consultations were needed in caring for this patient.   Treatment and Plan: Emergency department workup does not suggest an emergent condition requiring admission or immediate intervention beyond  what has been performed at this time. The patient is safe for discharge and has  been instructed to return immediately for worsening symptoms, change in  symptoms or any other concerns    Final Clinical Impressions(s) / ED Diagnoses     ICD-10-CM   1. Exacerbation of asthma, unspecified asthma severity,  unspecified whether persistent  J45.901     2. Rash  R21       ED Discharge Orders          Ordered    budesonide -formoterol  (SYMBICORT ) 160-4.5 MCG/ACT inhaler  2 times daily        03/05/24 0605    predniSONE  (DELTASONE ) 20 MG tablet  Daily        03/05/24 0605              Discharge Instructions Discussed with and Provided to Patient:   Discharge Instructions   None      Vicky Charleston, PA-C 03/05/24 [id number]    Griselda Norris, MD 03/05/24 [phone]

## 2024-03-05 NOTE — ED Triage Notes (Signed)
 BIBA for respiratory distress.  Inspiratory and expiratory wheezing noted by EMS.  Given solumedrol via IV and nebulized albuterol /Atrovent.  PT also stated she has skin rashes over her whole body which the PT believes in from mold in the hotel she just moved into  138/98 HR 80 RR 20
